# Patient Record
Sex: Female | Born: 1975
Health system: Southern US, Community
[De-identification: ages and names within clinical notes are randomized; demographics above are authoritative.]

## PROBLEM LIST (undated history)

## (undated) DIAGNOSIS — T7840XA Allergy, unspecified, initial encounter: Secondary | ICD-10-CM

## (undated) DIAGNOSIS — F17200 Nicotine dependence, unspecified, uncomplicated: Secondary | ICD-10-CM

## (undated) DIAGNOSIS — N952 Postmenopausal atrophic vaginitis: Secondary | ICD-10-CM

## (undated) DIAGNOSIS — G629 Polyneuropathy, unspecified: Secondary | ICD-10-CM

## (undated) DIAGNOSIS — J45909 Unspecified asthma, uncomplicated: Secondary | ICD-10-CM

## (undated) DIAGNOSIS — R05 Cough: Secondary | ICD-10-CM

## (undated) DIAGNOSIS — M1990 Unspecified osteoarthritis, unspecified site: Secondary | ICD-10-CM

## (undated) DIAGNOSIS — F32A Depression, unspecified: Secondary | ICD-10-CM

## (undated) DIAGNOSIS — N6310 Unspecified lump in the right breast, unspecified quadrant: Secondary | ICD-10-CM

## (undated) DIAGNOSIS — J310 Chronic rhinitis: Secondary | ICD-10-CM

## (undated) DIAGNOSIS — S92901A Unspecified fracture of right foot, initial encounter for closed fracture: Secondary | ICD-10-CM

## (undated) DIAGNOSIS — M797 Fibromyalgia: Secondary | ICD-10-CM

## (undated) DIAGNOSIS — T7422XA Child sexual abuse, confirmed, initial encounter: Secondary | ICD-10-CM

## (undated) DIAGNOSIS — F329 Major depressive disorder, single episode, unspecified: Secondary | ICD-10-CM

## (undated) DIAGNOSIS — G894 Chronic pain syndrome: Secondary | ICD-10-CM

## (undated) DIAGNOSIS — F3181 Bipolar II disorder: Secondary | ICD-10-CM

## (undated) DIAGNOSIS — K219 Gastro-esophageal reflux disease without esophagitis: Secondary | ICD-10-CM

## (undated) DIAGNOSIS — N94819 Vulvodynia, unspecified: Secondary | ICD-10-CM

## (undated) DIAGNOSIS — F40240 Claustrophobia: Secondary | ICD-10-CM

## (undated) DIAGNOSIS — R059 Cough, unspecified: Secondary | ICD-10-CM

## (undated) DIAGNOSIS — K429 Umbilical hernia without obstruction or gangrene: Secondary | ICD-10-CM

## (undated) DIAGNOSIS — N9089 Other specified noninflammatory disorders of vulva and perineum: Secondary | ICD-10-CM

## (undated) DIAGNOSIS — M5442 Lumbago with sciatica, left side: Secondary | ICD-10-CM

## (undated) DIAGNOSIS — R519 Headache, unspecified: Secondary | ICD-10-CM

## (undated) DIAGNOSIS — W19XXXA Unspecified fall, initial encounter: Secondary | ICD-10-CM

## (undated) DIAGNOSIS — F419 Anxiety disorder, unspecified: Secondary | ICD-10-CM

## (undated) DIAGNOSIS — E781 Pure hyperglyceridemia: Secondary | ICD-10-CM

## (undated) DIAGNOSIS — I10 Essential (primary) hypertension: Secondary | ICD-10-CM

## (undated) DIAGNOSIS — F4310 Post-traumatic stress disorder, unspecified: Secondary | ICD-10-CM

## (undated) DIAGNOSIS — R51 Headache: Secondary | ICD-10-CM

## (undated) HISTORY — DX: Fibromyalgia: M79.7

## (undated) HISTORY — DX: Gastro-esophageal reflux disease without esophagitis: K21.9

## (undated) HISTORY — PX: COLONOSCOPY: SHX174

## (undated) HISTORY — PX: ABDOMINAL HYSTERECTOMY: SHX81

## (undated) HISTORY — DX: Allergy, unspecified, initial encounter: T78.40XA

## (undated) HISTORY — PX: OTHER SURGICAL HISTORY: SHX169

## (undated) HISTORY — PX: HAND SURGERY: SHX662

## (undated) HISTORY — PX: WRIST SURGERY: SHX841

## (undated) HISTORY — DX: Polyneuropathy, unspecified: G62.9

## (undated) HISTORY — PX: CHOLECYSTECTOMY: SHX55

## (undated) HISTORY — PX: CYST REMOVAL TRUNK: SHX6283

## (undated) HISTORY — PX: DILATION AND CURETTAGE OF UTERUS: SHX78

## (undated) HISTORY — PX: APPENDECTOMY: SHX54

## (undated) HISTORY — PX: HERNIA REPAIR: SHX51

---

## 2012-02-26 DIAGNOSIS — N952 Postmenopausal atrophic vaginitis: Secondary | ICD-10-CM | POA: Insufficient documentation

## 2013-07-15 ENCOUNTER — Ambulatory Visit (INDEPENDENT_AMBULATORY_CARE_PROVIDER_SITE_OTHER): Payer: 59 | Admitting: Physician Assistant

## 2013-07-15 ENCOUNTER — Encounter: Payer: Self-pay | Admitting: Physician Assistant

## 2013-07-15 VITALS — BP 144/85 | HR 102 | Ht 69.5 in | Wt 288.0 lb

## 2013-07-15 DIAGNOSIS — M545 Low back pain, unspecified: Secondary | ICD-10-CM

## 2013-07-15 DIAGNOSIS — Z13220 Encounter for screening for lipoid disorders: Secondary | ICD-10-CM

## 2013-07-15 DIAGNOSIS — M5430 Sciatica, unspecified side: Secondary | ICD-10-CM

## 2013-07-15 DIAGNOSIS — Z23 Encounter for immunization: Secondary | ICD-10-CM

## 2013-07-15 DIAGNOSIS — M797 Fibromyalgia: Secondary | ICD-10-CM

## 2013-07-15 DIAGNOSIS — G43909 Migraine, unspecified, not intractable, without status migrainosus: Secondary | ICD-10-CM

## 2013-07-15 DIAGNOSIS — G629 Polyneuropathy, unspecified: Secondary | ICD-10-CM

## 2013-07-15 DIAGNOSIS — Z131 Encounter for screening for diabetes mellitus: Secondary | ICD-10-CM

## 2013-07-15 DIAGNOSIS — G589 Mononeuropathy, unspecified: Secondary | ICD-10-CM

## 2013-07-15 DIAGNOSIS — M5440 Lumbago with sciatica, unspecified side: Secondary | ICD-10-CM

## 2013-07-15 DIAGNOSIS — IMO0001 Reserved for inherently not codable concepts without codable children: Secondary | ICD-10-CM

## 2013-07-15 MED ORDER — PREGABALIN 50 MG PO CAPS: 50.0000 mg | ORAL_CAPSULE | Freq: Three times a day (TID) | ORAL | Status: DC

## 2013-07-15 MED ORDER — PREGABALIN 75 MG PO CAPS: 75.0000 mg | ORAL_CAPSULE | Freq: Three times a day (TID) | ORAL | Status: DC

## 2013-07-15 MED ORDER — AMITRIPTYLINE HCL 25 MG PO TABS: ORAL_TABLET | ORAL | Status: DC

## 2013-07-15 NOTE — Patient Instructions (Signed)
Will refer to rheumatologist.   Will start amitripyline 1-2 tabs every night.   Will start lyrica 60m three times a day.

## 2013-07-18 DIAGNOSIS — G629 Polyneuropathy, unspecified: Secondary | ICD-10-CM | POA: Insufficient documentation

## 2013-07-18 DIAGNOSIS — M545 Low back pain, unspecified: Secondary | ICD-10-CM | POA: Insufficient documentation

## 2013-07-18 DIAGNOSIS — M5440 Lumbago with sciatica, unspecified side: Secondary | ICD-10-CM

## 2013-07-18 DIAGNOSIS — M797 Fibromyalgia: Secondary | ICD-10-CM | POA: Insufficient documentation

## 2013-07-18 DIAGNOSIS — G43909 Migraine, unspecified, not intractable, without status migrainosus: Secondary | ICD-10-CM | POA: Insufficient documentation

## 2013-07-18 NOTE — Progress Notes (Signed)
Subjective:    Patient ID: Kathryn Cline, female    DOB: January 12, 1976, 38 y.o.   MRN: [id number]  HPI Pt is a 38 yo female who presents to the clinic to establish care.   .. Active Ambulatory Problems    Diagnosis Date Noted  . Fibromyalgia 07/18/2013  . Neuropathy 07/18/2013  . Low back pain with radiation 07/18/2013  . Migraines 07/18/2013   Resolved Ambulatory Problems    Diagnosis Date Noted  . No Resolved Ambulatory Problems   Past Medical History  Diagnosis Date  . GERD (gastroesophageal reflux disease)   . Allergy    .Marland Kitchen Family History  Problem Relation Age of Onset  . Heart attack Brother   . Hypertension Brother   . Heart attack Maternal Grandmother   . Hypertension Maternal Grandmother   . Cancer Maternal Grandfather   . Heart attack Maternal Grandfather   . Hypertension Maternal Grandfather   . Cancer Paternal Grandmother   . Heart attack Paternal Grandmother   . Hypertension Paternal Grandmother   . Stroke Paternal Grandmother   . Heart attack Paternal Grandfather   . Hypertension Paternal Grandfather   . Hypertension Brother    .Marland Kitchen History   Social History  . Marital Status: Married    Spouse Name: N/A    Number of Children: N/A  . Years of Education: N/A   Occupational History  . Not on file.   Social History Main Topics  . Smoking status: Current Every Day Smoker  . Smokeless tobacco: Not on file  . Alcohol Use: No  . Drug Use: No  . Sexual Activity: Yes   Other Topics Concern  . Not on file   Social History Narrative  . No narrative on file    Pt lost insurance and now they are requiring her to switch PCP's.   Migraines- [facility] but controlled on [place].   Fibromyalgia- uncontrolled with significant pain into low back, hips and legs bilaterally. Difficult to exercise or feel like she wants to move. Taking cymbalta and flexeril daily. Per pt has herniated disc as well.   Bilateral  edema- controlled with lasix daily.    Review of Systems     Objective:   Physical Exam  Constitutional: She is oriented to person, place, and time. She appears well-developed and well-nourished.  Obese.   HENT:  Head: Normocephalic and atraumatic.  Cardiovascular: Regular rhythm and normal heart sounds.   Tachycardia at 102.  Pulmonary/Chest: Effort normal and breath sounds normal. She has no wheezes.  Neurological: She is alert and oriented to person, place, and time.  Skin: Skin is dry.  Psychiatric: She has a normal mood and affect. Her behavior is normal.          Assessment & Plan:  Fibromyalgia/low back pain with radiation into hips and down legs- PHQ-9 is 17. will wait to get records to see what imaging has been done and if there is any problem we can provide treatment for. As far as fibromyaglia discussed importance of exercise and moviing to help symptoms. Pt taking cymbalta. Start elavil at bedtime to help with night time symptoms. Start lyric three times a day. Pt cannot tolerate gabapentin due to sleepiness/spaced out feeling. Takes Ibuprofen not tried celebrex etc. I did order some autoimmune work up labs and vitamins to help better tease out true fibromyaglia or if anything metabolic going on. I think pt would benefit from rheumatologist appt. Pt is certainly  depressed perhaps due to pain etc. Will make medication adjustments to today and see if helping.   Elevated blood pressure- will continue to monitor. Borderline today. Decrease salt intake.   Bilateral lower leg edema- refilled lasix.    Discussed need for mammogram this year. Pt will wait until she has turned 38. Went ahead and gave lab slip for fasting labs.   Tdap given today.

## 2013-07-20 ENCOUNTER — Telehealth: Payer: Self-pay | Admitting: *Deleted

## 2013-07-20 NOTE — Telephone Encounter (Signed)
Pt calls and states that the amitriptyline 53m you gave her is way too strong she only took 1/2 tab at bedtime and it made her "feel as if in another world" and the next day was a zombie.  Wants to know if you can try something else with her?

## 2013-07-20 NOTE — Telephone Encounter (Signed)
Ok consider taking 1/4 tab of elavil or stopping all together. Let's see how lyrica alone works if not improving will consider other medications.

## 2013-07-20 NOTE — Telephone Encounter (Signed)
Pt notified and will take 1/4 tab and let us know if still too sedating. Clemetine Marker, LPN

## 2013-07-25 ENCOUNTER — Telehealth: Payer: Self-pay | Admitting: *Deleted

## 2013-07-25 NOTE — Telephone Encounter (Signed)
I was given the elavil to help more for pain control not sleep. If need something else for sleep I can send if not Let's just leave on lyrica alone. And when you follow up discuss increasing lyrica if needed.

## 2013-07-25 NOTE — Telephone Encounter (Signed)
Pt calls and states that she took the 1/4 tab of elavil and she still can not take that.  Sleeps all night but per pt "in another world the next day". Please advise

## 2013-07-27 NOTE — Telephone Encounter (Signed)
Pt notifeid to stop the elavil and continue with the Lyrica.  If still has pain then schedule office visit to discuss. Clemetine Marker, LPN

## 2013-08-19 ENCOUNTER — Ambulatory Visit (INDEPENDENT_AMBULATORY_CARE_PROVIDER_SITE_OTHER): Payer: 59 | Admitting: Physician Assistant

## 2013-08-19 ENCOUNTER — Encounter: Payer: Self-pay | Admitting: Physician Assistant

## 2013-08-19 VITALS — BP 146/93 | HR 126 | Wt 293.0 lb

## 2013-08-19 DIAGNOSIS — M25559 Pain in unspecified hip: Secondary | ICD-10-CM

## 2013-08-19 DIAGNOSIS — M545 Low back pain, unspecified: Secondary | ICD-10-CM

## 2013-08-19 DIAGNOSIS — M25551 Pain in right hip: Secondary | ICD-10-CM

## 2013-08-19 DIAGNOSIS — R112 Nausea with vomiting, unspecified: Secondary | ICD-10-CM

## 2013-08-19 DIAGNOSIS — S92901A Unspecified fracture of right foot, initial encounter for closed fracture: Secondary | ICD-10-CM

## 2013-08-19 DIAGNOSIS — IMO0001 Reserved for inherently not codable concepts without codable children: Secondary | ICD-10-CM

## 2013-08-19 DIAGNOSIS — M797 Fibromyalgia: Secondary | ICD-10-CM

## 2013-08-19 DIAGNOSIS — I498 Other specified cardiac arrhythmias: Secondary | ICD-10-CM

## 2013-08-19 DIAGNOSIS — R000 Tachycardia, unspecified: Secondary | ICD-10-CM

## 2013-08-19 DIAGNOSIS — M25552 Pain in left hip: Secondary | ICD-10-CM

## 2013-08-19 DIAGNOSIS — S92909A Unspecified fracture of unspecified foot, initial encounter for closed fracture: Secondary | ICD-10-CM

## 2013-08-19 MED ORDER — PROMETHAZINE HCL 25 MG/ML IJ SOLN
25.0000 mg | Freq: Once | INTRAMUSCULAR | Status: AC
  Administered 2013-08-19: 25 mg via INTRAMUSCULAR

## 2013-08-19 MED ORDER — DICLOFENAC SODIUM 1 % TD GEL: 4.0000 g | Freq: Four times a day (QID) | TRANSDERMAL | Status: DC

## 2013-08-19 MED ORDER — DULOXETINE HCL 30 MG PO CPEP: ORAL_CAPSULE | ORAL | Status: DC

## 2013-08-19 MED ORDER — PROMETHAZINE HCL 25 MG PO TABS: 25.0000 mg | ORAL_TABLET | Freq: Four times a day (QID) | ORAL | Status: DC | PRN

## 2013-08-19 MED ORDER — KETOROLAC TROMETHAMINE 60 MG/2ML IM SOLN
60.0000 mg | Freq: Once | INTRAMUSCULAR | Status: AC
  Administered 2013-08-19: 60 mg via INTRAMUSCULAR

## 2013-08-19 NOTE — Patient Instructions (Addendum)
Added Voltaren.  Continue cymbalta increased to 60m.   Follow up with orthopedic.  Get xrays of lower back and hip.

## 2013-08-22 DIAGNOSIS — M545 Low back pain, unspecified: Secondary | ICD-10-CM | POA: Insufficient documentation

## 2013-08-22 DIAGNOSIS — M25551 Pain in right hip: Secondary | ICD-10-CM | POA: Insufficient documentation

## 2013-08-22 DIAGNOSIS — M25552 Pain in left hip: Secondary | ICD-10-CM

## 2013-08-22 DIAGNOSIS — S92901A Unspecified fracture of right foot, initial encounter for closed fracture: Secondary | ICD-10-CM | POA: Insufficient documentation

## 2013-08-22 NOTE — Progress Notes (Signed)
   Subjective:    Patient ID: Kathryn Cline, female    DOB: 14-Apr-1976, 38 y.o.   MRN: [id number]  HPI Pt is a 38 yo female who presents to the clinic with a fibromyalgia flare and well as pain from right broken foot. She is in a boot today and managed by othopedics clinic. She fell last weekend and broke her right foot. This has caused onset of pain today. She has been given percocet by orthopedics today. She feels like that is not controlling pain. She is very nauseated due to pain. She is vomiting. No fever, chills, abdominal pain.   Fibromyalgia- cymbalta has helped significantly with pain. She did not get labs drawn from last visit. I need her to get these. Not able to tolerate elavil, lyrica, and gabapentin.    Bilateral hip pain and radiation into legs has been off and on for year or so but worse today. Radiates down into buttocks bilateral side. Nothing tried except medications for fibromyagia no recent trauma.     Review of Systems     Objective:   Physical Exam  Constitutional: She is oriented to person, place, and time.  Obese  Right foot in a boot.  HENT:  Head: Normocephalic and atraumatic.  Cardiovascular: Regular rhythm and normal heart sounds.   Tachycardia rechecked at 110.   Pulmonary/Chest: Effort normal and breath sounds normal.  Musculoskeletal:  Pain over lumbar spine to palpation. Straight leg test negative bilaterally.   Neurological: She is alert and oriented to person, place, and time.  Skin:  Flushed cheeks bilaterally.   Psychiatric: She has a normal mood and affect. Her behavior is normal.          Assessment & Plan:  Low back pain with radiation into buttocks/bilateral hip pain- ordered xrays today. Imaging has not been done and not formally evaluated. Discussed since seeing ortho for fractured foot may want to follow up with them as well. Toradol given 75m IM today. Could be right broken foot that cause alignment to be off and caused flare of  back pain. Gave low back stretches and exercises. Consider voltaren gel.   Fibromyalgia- ordered multiple labs to further evaluate fibromyaglia. Toradol given IM today. Voltaren gel was given for specific joints. Stay away from oral NSAIDs while right foot is healing. Pt does seem to tolerate gabapentin and lyrica. Cymbalta does help signifcantly. Will increase to 964mdaily.   Nausea and vomiting- pt did vomit before being seen today. Phenergan 2540mas given IM. Phenergan 27m82mally was also sent to pharmacy.   Sinus tachycardia- decreased at recheck. Concerned about dehydration. Encouraged pt to push fluids. Follow up in 4 weeks.   Right fractured foot- being seen by orthopedics. Placed in boot for 6 weeks. Discussed if needs pain medication to get for ortho.

## 2013-08-23 ENCOUNTER — Other Ambulatory Visit: Payer: Self-pay | Admitting: Physician Assistant

## 2013-08-23 ENCOUNTER — Ambulatory Visit (INDEPENDENT_AMBULATORY_CARE_PROVIDER_SITE_OTHER): Payer: 59

## 2013-08-23 DIAGNOSIS — M25551 Pain in right hip: Secondary | ICD-10-CM

## 2013-08-23 DIAGNOSIS — M25559 Pain in unspecified hip: Secondary | ICD-10-CM

## 2013-08-23 DIAGNOSIS — G8929 Other chronic pain: Secondary | ICD-10-CM

## 2013-08-23 DIAGNOSIS — M545 Low back pain, unspecified: Secondary | ICD-10-CM

## 2013-08-23 DIAGNOSIS — M25552 Pain in left hip: Secondary | ICD-10-CM

## 2013-08-23 DIAGNOSIS — M797 Fibromyalgia: Secondary | ICD-10-CM

## 2013-08-23 NOTE — Progress Notes (Unsigned)
Insurance not wanting to pay for 3 tabs of the 62m. Find out if patient finds the 959mdose benefical if she we might be able to prescribe the 608mlus the 33m31mce a day.

## 2013-08-24 ENCOUNTER — Encounter: Payer: Self-pay | Admitting: Physician Assistant

## 2013-08-24 DIAGNOSIS — E781 Pure hyperglyceridemia: Secondary | ICD-10-CM | POA: Insufficient documentation

## 2013-08-24 LAB — COMPLETE METABOLIC PANEL WITH GFR
ALK PHOS: 67 U/L (ref 39–117)
ALT: 38 U/L — AB (ref 0–35)
AST: 30 U/L (ref 0–37)
Albumin: 4.7 g/dL (ref 3.5–5.2)
BUN: 9 mg/dL (ref 6–23)
CHLORIDE: 100 meq/L (ref 96–112)
CO2: 25 meq/L (ref 19–32)
Calcium: 9.8 mg/dL (ref 8.4–10.5)
Creat: 0.6 mg/dL (ref 0.50–1.10)
GFR, Est African American: >89 mL/min
GFR, Est Non African American: >89 mL/min
GLUCOSE: 79 mg/dL (ref 70–99)
POTASSIUM: 4.7 meq/L (ref 3.5–5.3)
SODIUM: 139 meq/L (ref 135–145)
Total Bilirubin: 0.5 mg/dL (ref 0.2–1.2)
Total Protein: 7.5 g/dL (ref 6.0–8.3)

## 2013-08-24 LAB — LIPID PANEL
Cholesterol: 148 mg/dL (ref 0–200)
HDL: 29 mg/dL — ABNORMAL LOW (ref >39)
LDL Cholesterol: 41 mg/dL (ref 0–99)
Total CHOL/HDL Ratio: 5.1 Ratio
Triglycerides: 390 mg/dL — ABNORMAL HIGH (ref <150)
VLDL: 78 mg/dL — ABNORMAL HIGH (ref 0–40)

## 2013-08-24 LAB — SEDIMENTATION RATE: SED RATE: 7 mm/h (ref 0–22)

## 2013-08-24 LAB — VITAMIN D 25 HYDROXY (VIT D DEFICIENCY, FRACTURES): Vit D, 25-Hydroxy: 32 ng/mL (ref 30–89)

## 2013-08-24 LAB — ANA: ANA: NEGATIVE

## 2013-08-24 LAB — RHEUMATOID FACTOR

## 2013-08-24 LAB — CYCLIC CITRUL PEPTIDE ANTIBODY, IGG

## 2013-08-24 LAB — T4, FREE: Free T4: 1.15 ng/dL (ref 0.80–1.80)

## 2013-08-24 LAB — VITAMIN B12: VITAMIN B 12: 400 pg/mL (ref 211–911)

## 2013-08-24 LAB — TSH: TSH: 2.582 u[IU]/mL (ref 0.350–4.500)

## 2013-08-25 ENCOUNTER — Encounter: Payer: Self-pay | Admitting: Sports Medicine

## 2013-08-25 ENCOUNTER — Ambulatory Visit (INDEPENDENT_AMBULATORY_CARE_PROVIDER_SITE_OTHER): Payer: 59 | Admitting: Sports Medicine

## 2013-08-25 VITALS — BP 149/102 | HR 98 | Ht 69.0 in | Wt 294.0 lb

## 2013-08-25 DIAGNOSIS — M797 Fibromyalgia: Secondary | ICD-10-CM

## 2013-08-25 DIAGNOSIS — IMO0001 Reserved for inherently not codable concepts without codable children: Secondary | ICD-10-CM

## 2013-08-25 DIAGNOSIS — M160 Bilateral primary osteoarthritis of hip: Secondary | ICD-10-CM | POA: Insufficient documentation

## 2013-08-25 DIAGNOSIS — S92909A Unspecified fracture of unspecified foot, initial encounter for closed fracture: Secondary | ICD-10-CM

## 2013-08-25 DIAGNOSIS — M1610 Unilateral primary osteoarthritis, unspecified hip: Secondary | ICD-10-CM

## 2013-08-25 DIAGNOSIS — S92901A Unspecified fracture of right foot, initial encounter for closed fracture: Secondary | ICD-10-CM

## 2013-08-25 DIAGNOSIS — M169 Osteoarthritis of hip, unspecified: Secondary | ICD-10-CM

## 2013-08-25 MED ORDER — HYDROCODONE-ACETAMINOPHEN 10-325 MG PO TABS: 1.0000 | ORAL_TABLET | Freq: Three times a day (TID) | ORAL | Status: DC | PRN

## 2013-08-25 NOTE — Assessment & Plan Note (Signed)
Considering her x-rays and physical exam I do think her pain is coming from hip osteoarthritis. Unfortunately due to her concurrent healing tibial fracture I am unable to inject steroids. At this point I am going to place her through formal physical therapy and give her some hydrocodone 10/325 for use up to twice a day. I like to see her back in one month, at which point we can inject up of her hips. This will take her tibial fracture out to 6 weeks.

## 2013-08-25 NOTE — Assessment & Plan Note (Signed)
At this point we will avoid steroids injections due to her current healing tibial fracture. This should be healed in about 4 weeks.

## 2013-08-25 NOTE — Progress Notes (Unsigned)
Spoke with pt this morning.  She states that she hasn't started on the 13m yet because she couldn't get it filled.  She is currently just on the 680m

## 2013-08-25 NOTE — Progress Notes (Signed)
Patient ID: Kathryn Cline, female   DOB: [birth year], 38 y.o.   MRN: [id number]   Subjective:    I'm seeing this patient as a consultation for:  Kathryn Planas, PA-C  CC: bilateral hip pain  HPI: Patient is a 38 yo woman w/ pmh of fibromyalgia and peripheral neuropathy who presents with bilateral hip and lower back pain x several years.  Her pain began gradually and is dull and achy in nature.  It is most prominent in her lower back bilaterally but also radiates to the groin at times.  It is worst at night and after long periods of movement.  Rest improves the pain somewhat.  She has tried advil and flexeril, neither of which eradicate her pain.  She also endorses some pain radiating into her thighs.  Recent X-ray showed OA of bilateral hips. She has a recent ankle fracture of the right tibia (2 weeks ago).   Past medical history, Surgical history, Family history not pertinant except as noted below, Social history, Allergies, and medications have been entered into the medical record, reviewed, and no changes needed.   Review of Systems: No headache, visual changes, nausea, vomiting, diarrhea, constipation, dizziness, abdominal pain, skin rash, fevers, chills, night sweats, weight loss, swollen lymph nodes, body aches, joint swelling, muscle aches, chest pain, shortness of breath, mood changes, visual or auditory hallucinations.   Objective:   General: Well Developed, well nourished, and in no acute distress.  Neuro/Psych: Alert and oriented x3, extra-ocular muscles intact, able to move all 4 extremities, sensation grossly intact. Skin: Warm and dry, no rashes noted.  Respiratory: Not using accessory muscles, speaking in full sentences, trachea midline.  Cardiovascular: Pulses palpable, no extremity edema. Abdomen: Does not appear distended. Left Hip: ROM IR: 45 Deg, ER: 45 Deg, Flexion: 120 Deg, Extension: 100 Deg, Abduction: 45 Deg, Adduction: 45 Deg Strength IR: 5/5, ER: 5/5, Flexion: 5/5,  Extension: 5/5, Abduction: 5/5, Adduction: 5/5 Pelvic alignment unremarkable to inspection and palpation. Standing hip rotation and gait without trendelenburg sign / unsteadiness. Greater trochanter without tenderness to palpation. No tenderness over piriformis. No pain with FABER or FADIR. No SI joint tenderness and normal minimal SI movement. Pain reproduced with internal rotation Right Hip: ROM IR: 45 Deg, ER: 45 Deg, Flexion: 120 Deg, Extension: 100 Deg, Abduction: 45 Deg, Adduction: 45 Deg Strength IR: 5/5, ER: 5/5, Flexion: 5/5, Extension: 5/5, Abduction: 5/5, Adduction: 5/5 Pelvic alignment unremarkable to inspection and palpation. Standing hip rotation and gait without trendelenburg sign / unsteadiness. Greater trochanter without tenderness to palpation. No tenderness over piriformis. No pain with FABER or FADIR. No SI joint tenderness and normal minimal SI movement. Pain reproduced with internal rotation  X-rays were reviewed and do show mild to moderate osteoarthritis of both hip joints.  Impression and Recommendations:   This case required medical decision making of moderate complexity.  Pain and physical exam consistent with bilateral OA of hips.  Unlikely discogenic pain as PE is unremarkable and patient has confounding history of neuropathy and fibromyalgia.  Has failed conservative therapy so injections are a reasonable treatment at this time. Will have to postpone 2/2 fracture healing.   -f/u in 4 weeks for hip injections -Norco

## 2013-08-25 NOTE — Assessment & Plan Note (Signed)
Doubling cymbalta.

## 2013-08-26 ENCOUNTER — Other Ambulatory Visit: Payer: Self-pay | Admitting: Physician Assistant

## 2013-08-26 MED ORDER — ICOSAPENT ETHYL 1 G PO CAPS: ORAL_CAPSULE | ORAL | Status: DC

## 2013-08-26 NOTE — Progress Notes (Signed)
Try staying on 60 with addition of Dr. Darene Lamer suggestion and will go from there due to response.

## 2013-08-31 ENCOUNTER — Ambulatory Visit (INDEPENDENT_AMBULATORY_CARE_PROVIDER_SITE_OTHER): Payer: 59 | Admitting: Physical Therapy

## 2013-08-31 DIAGNOSIS — M1610 Unilateral primary osteoarthritis, unspecified hip: Secondary | ICD-10-CM

## 2013-08-31 DIAGNOSIS — M6281 Muscle weakness (generalized): Secondary | ICD-10-CM

## 2013-08-31 DIAGNOSIS — R262 Difficulty in walking, not elsewhere classified: Secondary | ICD-10-CM

## 2013-08-31 DIAGNOSIS — M169 Osteoarthritis of hip, unspecified: Secondary | ICD-10-CM

## 2013-08-31 DIAGNOSIS — M2550 Pain in unspecified joint: Secondary | ICD-10-CM

## 2013-09-05 ENCOUNTER — Encounter (INDEPENDENT_AMBULATORY_CARE_PROVIDER_SITE_OTHER): Payer: 59 | Admitting: Physical Therapy

## 2013-09-05 ENCOUNTER — Encounter (INDEPENDENT_AMBULATORY_CARE_PROVIDER_SITE_OTHER): Payer: Self-pay

## 2013-09-05 DIAGNOSIS — M169 Osteoarthritis of hip, unspecified: Secondary | ICD-10-CM

## 2013-09-05 DIAGNOSIS — M6281 Muscle weakness (generalized): Secondary | ICD-10-CM

## 2013-09-05 DIAGNOSIS — M1610 Unilateral primary osteoarthritis, unspecified hip: Secondary | ICD-10-CM

## 2013-09-05 DIAGNOSIS — M2550 Pain in unspecified joint: Secondary | ICD-10-CM

## 2013-09-05 DIAGNOSIS — R262 Difficulty in walking, not elsewhere classified: Secondary | ICD-10-CM

## 2013-09-07 ENCOUNTER — Telehealth: Payer: Self-pay | Admitting: *Deleted

## 2013-09-07 NOTE — Telephone Encounter (Signed)
Pt left vm asking if she could have a referral to [facility].

## 2013-09-08 ENCOUNTER — Encounter (INDEPENDENT_AMBULATORY_CARE_PROVIDER_SITE_OTHER): Payer: 59

## 2013-09-08 ENCOUNTER — Other Ambulatory Visit: Payer: 59 | Admitting: Physician Assistant

## 2013-09-08 DIAGNOSIS — M160 Bilateral primary osteoarthritis of hip: Secondary | ICD-10-CM

## 2013-09-08 DIAGNOSIS — R262 Difficulty in walking, not elsewhere classified: Secondary | ICD-10-CM

## 2013-09-08 DIAGNOSIS — M2550 Pain in unspecified joint: Secondary | ICD-10-CM

## 2013-09-08 DIAGNOSIS — M1610 Unilateral primary osteoarthritis, unspecified hip: Secondary | ICD-10-CM

## 2013-09-08 DIAGNOSIS — M6281 Muscle weakness (generalized): Secondary | ICD-10-CM

## 2013-09-08 DIAGNOSIS — M169 Osteoarthritis of hip, unspecified: Secondary | ICD-10-CM

## 2013-09-08 NOTE — Telephone Encounter (Signed)
Yes placed.

## 2013-09-09 NOTE — Telephone Encounter (Signed)
Pt.notified

## 2013-09-12 ENCOUNTER — Encounter (INDEPENDENT_AMBULATORY_CARE_PROVIDER_SITE_OTHER): Payer: 59 | Admitting: Physical Therapy

## 2013-09-12 DIAGNOSIS — M6281 Muscle weakness (generalized): Secondary | ICD-10-CM

## 2013-09-12 DIAGNOSIS — R262 Difficulty in walking, not elsewhere classified: Secondary | ICD-10-CM

## 2013-09-12 DIAGNOSIS — M169 Osteoarthritis of hip, unspecified: Secondary | ICD-10-CM

## 2013-09-12 DIAGNOSIS — M2550 Pain in unspecified joint: Secondary | ICD-10-CM

## 2013-09-12 DIAGNOSIS — M1610 Unilateral primary osteoarthritis, unspecified hip: Secondary | ICD-10-CM

## 2013-09-14 ENCOUNTER — Encounter (INDEPENDENT_AMBULATORY_CARE_PROVIDER_SITE_OTHER): Payer: 59

## 2013-09-14 DIAGNOSIS — M6281 Muscle weakness (generalized): Secondary | ICD-10-CM

## 2013-09-14 DIAGNOSIS — M1610 Unilateral primary osteoarthritis, unspecified hip: Secondary | ICD-10-CM

## 2013-09-14 DIAGNOSIS — M2550 Pain in unspecified joint: Secondary | ICD-10-CM

## 2013-09-14 DIAGNOSIS — R262 Difficulty in walking, not elsewhere classified: Secondary | ICD-10-CM

## 2013-09-14 DIAGNOSIS — M169 Osteoarthritis of hip, unspecified: Secondary | ICD-10-CM

## 2013-09-16 ENCOUNTER — Encounter: Payer: Self-pay | Admitting: Physician Assistant

## 2013-09-16 ENCOUNTER — Ambulatory Visit (INDEPENDENT_AMBULATORY_CARE_PROVIDER_SITE_OTHER): Payer: 59 | Admitting: Physician Assistant

## 2013-09-16 VITALS — BP 137/88 | HR 99 | Ht 69.0 in | Wt 295.0 lb

## 2013-09-16 DIAGNOSIS — J069 Acute upper respiratory infection, unspecified: Secondary | ICD-10-CM

## 2013-09-16 DIAGNOSIS — F329 Major depressive disorder, single episode, unspecified: Secondary | ICD-10-CM

## 2013-09-16 DIAGNOSIS — F32A Depression, unspecified: Secondary | ICD-10-CM

## 2013-09-16 DIAGNOSIS — F3289 Other specified depressive episodes: Secondary | ICD-10-CM

## 2013-09-16 DIAGNOSIS — R748 Abnormal levels of other serum enzymes: Secondary | ICD-10-CM

## 2013-09-16 DIAGNOSIS — B9689 Other specified bacterial agents as the cause of diseases classified elsewhere: Secondary | ICD-10-CM

## 2013-09-16 DIAGNOSIS — M797 Fibromyalgia: Secondary | ICD-10-CM

## 2013-09-16 DIAGNOSIS — J329 Chronic sinusitis, unspecified: Secondary | ICD-10-CM

## 2013-09-16 DIAGNOSIS — IMO0001 Reserved for inherently not codable concepts without codable children: Secondary | ICD-10-CM

## 2013-09-16 DIAGNOSIS — A499 Bacterial infection, unspecified: Secondary | ICD-10-CM

## 2013-09-16 MED ORDER — AMOXICILLIN 500 MG PO CAPS: 500.0000 mg | ORAL_CAPSULE | Freq: Three times a day (TID) | ORAL | Status: DC

## 2013-09-16 MED ORDER — DULOXETINE HCL 60 MG PO CPEP: ORAL_CAPSULE | ORAL | Status: DC

## 2013-09-16 MED ORDER — FUROSEMIDE 40 MG PO TABS: 40.0000 mg | ORAL_TABLET | Freq: Every day | ORAL | Status: DC

## 2013-09-16 MED ORDER — ESOMEPRAZOLE MAGNESIUM 40 MG PO CPDR: 40.0000 mg | DELAYED_RELEASE_CAPSULE | Freq: Every day | ORAL | Status: DC

## 2013-09-17 LAB — HEPATIC FUNCTION PANEL
ALT: 40 U/L — ABNORMAL HIGH (ref 0–35)
AST: 29 U/L (ref 0–37)
Albumin: 4.4 g/dL (ref 3.5–5.2)
Alkaline Phosphatase: 59 U/L (ref 39–117)
BILIRUBIN DIRECT: 0.1 mg/dL (ref 0.0–0.3)
BILIRUBIN TOTAL: 0.5 mg/dL (ref 0.2–1.2)
Indirect Bilirubin: 0.4 mg/dL (ref 0.2–1.2)
Total Protein: 7.1 g/dL (ref 6.0–8.3)

## 2013-09-19 ENCOUNTER — Encounter (INDEPENDENT_AMBULATORY_CARE_PROVIDER_SITE_OTHER): Payer: 59 | Admitting: Physical Therapy

## 2013-09-19 DIAGNOSIS — M1610 Unilateral primary osteoarthritis, unspecified hip: Secondary | ICD-10-CM

## 2013-09-19 DIAGNOSIS — M6281 Muscle weakness (generalized): Secondary | ICD-10-CM

## 2013-09-19 DIAGNOSIS — M2550 Pain in unspecified joint: Secondary | ICD-10-CM

## 2013-09-19 DIAGNOSIS — R262 Difficulty in walking, not elsewhere classified: Secondary | ICD-10-CM

## 2013-09-19 DIAGNOSIS — M169 Osteoarthritis of hip, unspecified: Secondary | ICD-10-CM

## 2013-09-19 NOTE — Progress Notes (Addendum)
   Subjective:    Patient ID: Kathryn Cline, female    DOB: December 19, 1975, 38 y.o.   MRN: [id number]  HPI Patient presents to the clinic to followup on fibromyalgia as well as discussed not feeling well.  Fibromyalgia-Dr. T. recently increase her Cymbalta to 1 when he noted gram daily for pain control. She is doing much better on this regimen. She would like to continue it. She still has a lot of difficulty staying active. With the osteoarthritis in both hips it is hard to be active. He is also working with this.Mood has significantly improved.   ..SINUSITIS  Onset: 7 days  Severity: moderate Worse with:time and position changes Better with: nothing  Symptoms Cough: no Runny nose: no Fever: no   Highest Temp: none Sinus Pressure: yes  Ears Blocked: yes  Teeth Ache: yes  Frontal Headache: yes  Second sickening: no   PMH Sinusitis or Recurrent OM: no  PMH Prior Sinus or Ear Surgery: no  Recent antibiotic usage (last 30 days): no  PMH of Diabetes or Immunocompromise: no    Red flags Change in mental state: no Change in vision: no Rash: no    Review of Systems  All other systems reviewed and are negative.      Objective:   Physical Exam  Constitutional: She is oriented to person, place, and time. She appears well-developed and well-nourished.  HENT:  Head: Normocephalic and atraumatic.  Right Ear: External ear normal.  Left Ear: External ear normal.  Nose: Nose normal.  Mouth/Throat: Oropharynx is clear and moist.  Tm's clear.  Maxillary tenderness to palpation bilaterally.   Eyes: Conjunctivae are normal. Right eye exhibits no discharge. Left eye exhibits no discharge.  Neck: Normal range of motion. Neck supple.  Cardiovascular: Normal rate, regular rhythm and normal heart sounds.   Pulmonary/Chest: Effort normal and breath sounds normal. She has no wheezes.  Lymphadenopathy:    She has no cervical adenopathy.  Neurological: She is alert and oriented to person,  place, and time.  Psychiatric: She has a normal mood and affect. Her behavior is normal.          Assessment & Plan:  Fibromyalgia/depression-send prescription for Cymbalta 120 mg daily to pharmacy. Patient is seen Dr. Darene Lamer. for osteoarthritis of both hips along with pain management syndrome. She requests Vicodin today. We'll defer to Dr. Darene Lamer. for narcotic since he is treating her pain. Pt is is repsonding to cymbalta and depression is improving.   Bacterial sinusitis/URI-treated with Amoxil today for 10 days. Discuss symptomatic care. Handout given. Call if not improving.  Elevated liver enzymes-patient is on chronic narcotics along with Tylenol. She admits to taking copious amounts of Tylenol. She has stopped taking the Tylenol with hydrocodone. We'll recheck today.

## 2013-09-21 ENCOUNTER — Encounter: Payer: Self-pay | Admitting: Physician Assistant

## 2013-09-21 ENCOUNTER — Telehealth: Payer: Self-pay | Admitting: *Deleted

## 2013-09-21 DIAGNOSIS — F329 Major depressive disorder, single episode, unspecified: Secondary | ICD-10-CM | POA: Insufficient documentation

## 2013-09-21 DIAGNOSIS — F32A Depression, unspecified: Secondary | ICD-10-CM | POA: Insufficient documentation

## 2013-09-21 NOTE — Telephone Encounter (Signed)
Pt is on her last day of Cymbalta. What would you like for her to do since insurance denied Cymbalta?

## 2013-09-21 NOTE — Telephone Encounter (Signed)
Call in 51m daily until we can get [id number].

## 2013-09-22 ENCOUNTER — Encounter (INDEPENDENT_AMBULATORY_CARE_PROVIDER_SITE_OTHER): Payer: 59 | Admitting: Physical Therapy

## 2013-09-22 ENCOUNTER — Encounter: Payer: Self-pay | Admitting: Sports Medicine

## 2013-09-22 ENCOUNTER — Ambulatory Visit (INDEPENDENT_AMBULATORY_CARE_PROVIDER_SITE_OTHER): Payer: 59 | Admitting: Sports Medicine

## 2013-09-22 VITALS — BP 142/92 | HR 88 | Ht 69.0 in | Wt 292.0 lb

## 2013-09-22 DIAGNOSIS — M1610 Unilateral primary osteoarthritis, unspecified hip: Secondary | ICD-10-CM

## 2013-09-22 DIAGNOSIS — M6281 Muscle weakness (generalized): Secondary | ICD-10-CM

## 2013-09-22 DIAGNOSIS — M2550 Pain in unspecified joint: Secondary | ICD-10-CM

## 2013-09-22 DIAGNOSIS — M160 Bilateral primary osteoarthritis of hip: Secondary | ICD-10-CM

## 2013-09-22 DIAGNOSIS — M169 Osteoarthritis of hip, unspecified: Secondary | ICD-10-CM

## 2013-09-22 DIAGNOSIS — R262 Difficulty in walking, not elsewhere classified: Secondary | ICD-10-CM

## 2013-09-22 MED ORDER — DULOXETINE HCL 60 MG PO CPEP: 60.0000 mg | ORAL_CAPSULE | Freq: Every day | ORAL | Status: DC

## 2013-09-22 NOTE — Assessment & Plan Note (Signed)
Foot fractures now healed. Bilateral femoral acetabular joint injection as above. Return in a month.

## 2013-09-22 NOTE — Progress Notes (Signed)
  Subjective:    CC: Followup  HPI: Bilateral hip osteoarthritis: Continues to have pain, we have deferred injection due to healing for fracture. She returns today for consideration of injection with bilateral groin pain radiating into the buttock. Worse with weightbearing. Moderate, persistent.  Past medical history, Surgical history, Family history not pertinant except as noted below, Social history, Allergies, and medications have been entered into the medical record, reviewed, and no changes needed.   Review of Systems: No fevers, chills, night sweats, weight loss, chest pain, or shortness of breath.   Objective:    General: Well Developed, well nourished, and in no acute distress.  Neuro: Alert and oriented x3, extra-ocular muscles intact, sensation grossly intact.  HEENT: Normocephalic, atraumatic, pupils equal round reactive to light, neck supple, no masses, no lymphadenopathy, thyroid nonpalpable.  Skin: Warm and dry, no rashes. Cardiac: Regular rate and rhythm, no murmurs rubs or gallops, no lower extremity edema.  Respiratory: Clear to auscultation bilaterally. Not using accessory muscles, speaking in full sentences.  Procedure: Real-time Ultrasound Guided Injection of left femoral acetabular joint Device: GE Logiq E  Verbal informed consent obtained.  Time-out conducted.  Noted no overlying erythema, induration, or other signs of local infection.  Skin prepped in a sterile fashion.  Local anesthesia: Topical Ethyl chloride.  With sterile technique and under real time ultrasound guidance:  Spinal needle advanced to the femoral head/neck junction, 2 cc Kenalog 40, 4 cc lidocaine injected easily. Completed without difficulty  Pain immediately resolved suggesting accurate placement of the medication.  Advised to call if fevers/chills, erythema, induration, drainage, or persistent bleeding.  Images permanently stored and available for review in the ultrasound unit.  Impression:  Technically successful ultrasound guided injection.  Procedure: Real-time Ultrasound Guided Injection of right femoral acetabular joint Device: GE Logiq E  Verbal informed consent obtained.  Time-out conducted.  Noted no overlying erythema, induration, or other signs of local infection.  Skin prepped in a sterile fashion.  Local anesthesia: Topical Ethyl chloride.  With sterile technique and under real time ultrasound guidance:  Spinal needle advanced to the femoral head/neck junction, 2 cc Kenalog 40, 4 cc lidocaine injected easily. Completed without difficulty  Pain immediately resolved suggesting accurate placement of the medication.  Advised to call if fevers/chills, erythema, induration, drainage, or persistent bleeding.  Images permanently stored and available for review in the ultrasound unit.  Impression: Technically successful ultrasound guided injection.  Impression and Recommendations:

## 2013-09-22 NOTE — Telephone Encounter (Signed)
LM on VM with instructions.  Oscar La, LPN

## 2013-09-23 ENCOUNTER — Other Ambulatory Visit: Payer: Self-pay | Admitting: *Deleted

## 2013-09-23 MED ORDER — DULOXETINE HCL 60 MG PO CPEP: 60.0000 mg | ORAL_CAPSULE | Freq: Every day | ORAL | Status: DC

## 2013-09-29 ENCOUNTER — Encounter (INDEPENDENT_AMBULATORY_CARE_PROVIDER_SITE_OTHER): Payer: 59 | Admitting: Physical Therapy

## 2013-09-29 DIAGNOSIS — R262 Difficulty in walking, not elsewhere classified: Secondary | ICD-10-CM

## 2013-09-29 DIAGNOSIS — M6281 Muscle weakness (generalized): Secondary | ICD-10-CM

## 2013-09-29 DIAGNOSIS — M1610 Unilateral primary osteoarthritis, unspecified hip: Secondary | ICD-10-CM

## 2013-09-29 DIAGNOSIS — M169 Osteoarthritis of hip, unspecified: Secondary | ICD-10-CM

## 2013-09-29 DIAGNOSIS — M2550 Pain in unspecified joint: Secondary | ICD-10-CM

## 2013-09-30 ENCOUNTER — Encounter (INDEPENDENT_AMBULATORY_CARE_PROVIDER_SITE_OTHER): Payer: 59 | Admitting: Physical Therapy

## 2013-09-30 DIAGNOSIS — M6281 Muscle weakness (generalized): Secondary | ICD-10-CM

## 2013-09-30 DIAGNOSIS — M1610 Unilateral primary osteoarthritis, unspecified hip: Secondary | ICD-10-CM

## 2013-09-30 DIAGNOSIS — R262 Difficulty in walking, not elsewhere classified: Secondary | ICD-10-CM

## 2013-09-30 DIAGNOSIS — M2550 Pain in unspecified joint: Secondary | ICD-10-CM

## 2013-09-30 DIAGNOSIS — M169 Osteoarthritis of hip, unspecified: Secondary | ICD-10-CM

## 2013-10-03 ENCOUNTER — Encounter (INDEPENDENT_AMBULATORY_CARE_PROVIDER_SITE_OTHER): Payer: 59 | Admitting: Physical Therapy

## 2013-10-03 DIAGNOSIS — R262 Difficulty in walking, not elsewhere classified: Secondary | ICD-10-CM

## 2013-10-03 DIAGNOSIS — M2550 Pain in unspecified joint: Secondary | ICD-10-CM

## 2013-10-03 DIAGNOSIS — M169 Osteoarthritis of hip, unspecified: Secondary | ICD-10-CM

## 2013-10-03 DIAGNOSIS — M1610 Unilateral primary osteoarthritis, unspecified hip: Secondary | ICD-10-CM

## 2013-10-03 DIAGNOSIS — M6281 Muscle weakness (generalized): Secondary | ICD-10-CM

## 2013-10-06 ENCOUNTER — Encounter (INDEPENDENT_AMBULATORY_CARE_PROVIDER_SITE_OTHER): Payer: 59

## 2013-10-06 DIAGNOSIS — R262 Difficulty in walking, not elsewhere classified: Secondary | ICD-10-CM

## 2013-10-06 DIAGNOSIS — M2550 Pain in unspecified joint: Secondary | ICD-10-CM

## 2013-10-06 DIAGNOSIS — M6281 Muscle weakness (generalized): Secondary | ICD-10-CM

## 2013-10-06 DIAGNOSIS — M1610 Unilateral primary osteoarthritis, unspecified hip: Secondary | ICD-10-CM

## 2013-10-06 DIAGNOSIS — M169 Osteoarthritis of hip, unspecified: Secondary | ICD-10-CM

## 2013-10-10 ENCOUNTER — Encounter (INDEPENDENT_AMBULATORY_CARE_PROVIDER_SITE_OTHER): Payer: 59 | Admitting: Physical Therapy

## 2013-10-10 ENCOUNTER — Encounter: Payer: Self-pay | Admitting: Physician Assistant

## 2013-10-10 ENCOUNTER — Ambulatory Visit (INDEPENDENT_AMBULATORY_CARE_PROVIDER_SITE_OTHER): Payer: 59 | Admitting: Physician Assistant

## 2013-10-10 VITALS — BP 138/93 | HR 97 | Temp 98.1°F | Wt 286.0 lb

## 2013-10-10 DIAGNOSIS — M6281 Muscle weakness (generalized): Secondary | ICD-10-CM

## 2013-10-10 DIAGNOSIS — T63301A Toxic effect of unspecified spider venom, accidental (unintentional), initial encounter: Secondary | ICD-10-CM

## 2013-10-10 DIAGNOSIS — R262 Difficulty in walking, not elsewhere classified: Secondary | ICD-10-CM

## 2013-10-10 DIAGNOSIS — M1610 Unilateral primary osteoarthritis, unspecified hip: Secondary | ICD-10-CM

## 2013-10-10 DIAGNOSIS — R635 Abnormal weight gain: Secondary | ICD-10-CM

## 2013-10-10 DIAGNOSIS — M2550 Pain in unspecified joint: Secondary | ICD-10-CM

## 2013-10-10 DIAGNOSIS — M169 Osteoarthritis of hip, unspecified: Secondary | ICD-10-CM

## 2013-10-10 DIAGNOSIS — T6391XA Toxic effect of contact with unspecified venomous animal, accidental (unintentional), initial encounter: Secondary | ICD-10-CM

## 2013-10-10 MED ORDER — PREDNISONE 20 MG PO TABS: ORAL_TABLET | ORAL | Status: DC

## 2013-10-10 MED ORDER — PHENTERMINE HCL 37.5 MG PO TABS: 37.5000 mg | ORAL_TABLET | Freq: Every day | ORAL | Status: DC

## 2013-10-10 NOTE — Patient Instructions (Signed)
Spider Bite Spider bites are not common. Most spider bites do not cause serious problems. The elderly, very young children, and people with certain existing medical conditions are more likely to experience significant symptoms. SYMPTOMS  Spider bites may not cause any pain at first. Within 1 or 2 days of the bite, there may be swelling, redness, and pain in the bite area. However, some spider bites can cause pain within the first hour. TREATMENT  Your caregiver may prescribe antibiotic medicine if a bacterial infection develops in the bite. However, not all spider bites require antibiotics or prescription medicines.  HOME CARE INSTRUCTIONS  Do not scratch the bite area.  Keep the bite area clean and dry. Wash the area with soap and water as directed.  Put ice or cool compresses on the bite area.  Put ice in a plastic bag.  Place a towel between your skin and the bag.  Leave the ice on for 20 minutes, 4 times a day for the first 2 to 3 days, or as directed.  Keep the bite area elevated above the level of your heart. This helps reduce redness and swelling.  Only take over-the-counter or prescription medicines as directed by your caregiver.  If you are given antibiotics, take them as directed. Finish them even if you start to feel better. You may need a tetanus shot if:  You cannot remember when you had your last tetanus shot.  You have never had a tetanus shot.  The injury broke your skin. If you get a tetanus shot, your arm may swell, get red, and feel warm to the touch. This is common and not a problem. If you need a tetanus shot and you choose not to have one, there is a rare chance of getting tetanus. Sickness from tetanus can be serious. SEEK MEDICAL CARE IF: Your bite is not better after 3 days of treatment. SEEK IMMEDIATE MEDICAL CARE IF:  Your bite turns purple or develops increased swelling, pain, or redness.  You develop shortness of breath or chest pain.  You have  muscle cramps or painful muscle spasms.  You develop abdominal pain, nausea, or vomiting.  You feel unusually tired or sleepy. MAKE SURE YOU:  Understand these instructions.  Will watch your condition.  Will get help right away if you are not doing well or get worse. Document Released: 05/29/2004 Document Revised: 07/14/2011 Document Reviewed: 11/20/2010 [facility] Patient Information 2014 [place].

## 2013-10-10 NOTE — Progress Notes (Signed)
   Subjective:    Patient ID: Kathryn Cline, female    DOB: 1975-07-06, 38 y.o.   MRN: [id number]  HPI Pt got a bite on her left middle back 2 days ago. She felt something bite and when she swatted away saw a spider. Put peroxide on wound. Done nothing else. Continues to itch and be swollen and red. She has felt a little dizzy and swimmy head last few days. Her stomach has been a little uneasy. No fever, chills, diarrhea. No HA.   Would like to lose weight. Lost 9lbs on her own. Feels like she needs help. Limited her calories but struggles to exercise. Has ongoing osteoarthritis but is improving.    Review of Systems  All other systems reviewed and are negative.      Objective:   Physical Exam  Constitutional: She is oriented to person, place, and time. She appears well-developed and well-nourished.  Pulmonary/Chest:    Neurological: She is alert and oriented to person, place, and time.  Psychiatric: She has a normal mood and affect. Her behavior is normal.          Assessment & Plan:  Spider bite- ice 43mn at a time. Ibuprofen for inflammation and swelling. Placed bactroban and gave some samples instructed to keep wound clean and dry. Gave prednisone for 3 days. Gave HO on spider bite. Discussed warning signs of infection. If not improving or if symptoms worsening could consider abx for infection. I do not feel like pt has infection today. Tdap UPT.   Obesity/abnormal weight gain- will start phentermine. Discussed side effects. Follow up in one month. Best thing she can do for osteoarthritis is to lose weight. Suggested 1200 calorie diet and walking 340mutes at least three times a week to start.   Spent 30 minutes with pt and greater than 50 percent of visit spent counseling pt regarding weight loss.

## 2013-10-11 ENCOUNTER — Telehealth: Payer: Self-pay

## 2013-10-11 NOTE — Telephone Encounter (Signed)
Kathryn Cline complains of vomiting. Denies fever, chills or sweats. I advised her to stop the prednisone per [name], PA-C and to call if symptoms persist or worsen.

## 2013-10-13 ENCOUNTER — Encounter: Payer: 59 | Admitting: Physical Therapy

## 2013-10-18 ENCOUNTER — Encounter: Payer: 59 | Admitting: Physical Therapy

## 2013-10-20 ENCOUNTER — Encounter: Payer: Self-pay | Admitting: Sports Medicine

## 2013-10-20 ENCOUNTER — Ambulatory Visit (INDEPENDENT_AMBULATORY_CARE_PROVIDER_SITE_OTHER): Payer: 59 | Admitting: Sports Medicine

## 2013-10-20 VITALS — BP 131/81 | HR 93 | Ht 69.0 in | Wt 284.0 lb

## 2013-10-20 DIAGNOSIS — M160 Bilateral primary osteoarthritis of hip: Secondary | ICD-10-CM

## 2013-10-20 DIAGNOSIS — M1610 Unilateral primary osteoarthritis, unspecified hip: Secondary | ICD-10-CM

## 2013-10-20 DIAGNOSIS — IMO0002 Reserved for concepts with insufficient information to code with codable children: Secondary | ICD-10-CM

## 2013-10-20 DIAGNOSIS — M169 Osteoarthritis of hip, unspecified: Secondary | ICD-10-CM

## 2013-10-20 DIAGNOSIS — L02419 Cutaneous abscess of limb, unspecified: Secondary | ICD-10-CM | POA: Insufficient documentation

## 2013-10-20 MED ORDER — CELECOXIB 200 MG PO CAPS: ORAL_CAPSULE | ORAL | Status: DC

## 2013-10-20 MED ORDER — DOXYCYCLINE HYCLATE 100 MG PO TABS: 100.0000 mg | ORAL_TABLET | Freq: Two times a day (BID) | ORAL | Status: AC

## 2013-10-20 MED ORDER — MELOXICAM 15 MG PO TABS: ORAL_TABLET | ORAL | Status: DC

## 2013-10-20 MED ORDER — HYDROCODONE-ACETAMINOPHEN 10-325 MG PO TABS: 1.0000 | ORAL_TABLET | Freq: Every evening | ORAL | Status: DC | PRN

## 2013-10-20 NOTE — Assessment & Plan Note (Signed)
50% response to bilateral intra-articular injection at the last visit. Adding Celebrex, refilling hydrocodone. She does understand that she is to use no more than 30 hydrocodone per month, and if continues to use on top of the Celebrex we will need to consider referral for hip arthroplasty I am also going to provide a prescription for Mobic should Celebrex be too expensive

## 2013-10-20 NOTE — Assessment & Plan Note (Signed)
Tender but not fluctuant. Doxycycline for 7 days, if no better, we will perform an incision and drainage here in the office.

## 2013-10-20 NOTE — Progress Notes (Signed)
  Subjective:    CC: Followup  HPI: Bilateral hip osteoarthritis : Kathryn Cline returns, at the last visit we injected both of her hips, pain relief was instantaneous however she now has only 50% relief of her pain. She is taking hydrocodone really has not been taking any NSAIDs. Pain is localized in the groins, moderate, persistent, she understandably would like to delay total hip arthroplasty as long as possible.  Left underarm lesion: Painful, reddish, swollen.  Past medical history, Surgical history, Family history not pertinant except as noted below, Social history, Allergies, and medications have been entered into the medical record, reviewed, and no changes needed.   Review of Systems: No fevers, chills, night sweats, weight loss, chest pain, or shortness of breath.   Objective:    General: Well Developed, well nourished, and in no acute distress.  Neuro: Alert and oriented x3, extra-ocular muscles intact, sensation grossly intact.  HEENT: Normocephalic, atraumatic, pupils equal round reactive to light, neck supple, no masses, no lymphadenopathy, thyroid nonpalpable.  Skin: Warm and dry, no rashes. There is a left axillary abscess that is not indurated, slightly red, and not fluctuant. Cardiac: Regular rate and rhythm, no murmurs rubs or gallops, no lower extremity edema.  Respiratory: Clear to auscultation bilaterally. Not using accessory muscles, speaking in full sentences.  Impression and Recommendations:

## 2013-11-17 ENCOUNTER — Encounter: Payer: Self-pay | Admitting: Sports Medicine

## 2013-11-17 ENCOUNTER — Ambulatory Visit (INDEPENDENT_AMBULATORY_CARE_PROVIDER_SITE_OTHER): Payer: 59 | Admitting: Sports Medicine

## 2013-11-17 VITALS — BP 136/84 | HR 88 | Ht 69.0 in | Wt 285.0 lb

## 2013-11-17 DIAGNOSIS — M25551 Pain in right hip: Secondary | ICD-10-CM

## 2013-11-17 DIAGNOSIS — E66813 Obesity, class 3: Secondary | ICD-10-CM | POA: Insufficient documentation

## 2013-11-17 DIAGNOSIS — M1610 Unilateral primary osteoarthritis, unspecified hip: Secondary | ICD-10-CM

## 2013-11-17 DIAGNOSIS — M160 Bilateral primary osteoarthritis of hip: Secondary | ICD-10-CM

## 2013-11-17 DIAGNOSIS — M25559 Pain in unspecified hip: Secondary | ICD-10-CM

## 2013-11-17 DIAGNOSIS — M25552 Pain in left hip: Principal | ICD-10-CM

## 2013-11-17 MED ORDER — GLUCOSAMINE-CHONDROITIN 750-600 MG PO CHEW: 2.0000 | CHEWABLE_TABLET | Freq: Two times a day (BID) | ORAL | Status: DC

## 2013-11-17 MED ORDER — DEVILS CLAW ROOT POWD: Status: DC

## 2013-11-17 MED ORDER — CELECOXIB 200 MG PO CAPS: 200.0000 mg | ORAL_CAPSULE | Freq: Two times a day (BID) | ORAL | Status: DC

## 2013-11-17 NOTE — Assessment & Plan Note (Signed)
Only moderate weight loss on phentermine. Referral for bariatric surgery to help unload the hips.

## 2013-11-17 NOTE — Assessment & Plan Note (Signed)
Doing ok 2 months after injection. Celebrex 2x a day helps significantly, refilling. Adding devils claw and glucosamine/chondroitin. Referral for bariatric surgery to unload the joint. We can reinject next month.

## 2013-11-17 NOTE — Progress Notes (Signed)
  Subjective:    CC: Followup  HPI: Bilateral hip osteoarthritis : good response to bilateral femoral acetabular joint injections approximately 2 months ago, also using Celebrex twice a day which is also affected. Currently on phentermine to try and lose weight. Pain is mild, persistent. Not using hydrocodone.  Past medical history, Surgical history, Family history not pertinant except as noted below, Social history, Allergies, and medications have been entered into the medical record, reviewed, and no changes needed.   Review of Systems: No fevers, chills, night sweats, weight loss, chest pain, or shortness of breath.   Objective:    General: Well Developed, well nourished, and in no acute distress.  Neuro: Alert and oriented x3, extra-ocular muscles intact, sensation grossly intact.  HEENT: Normocephalic, atraumatic, pupils equal round reactive to light, neck supple, no masses, no lymphadenopathy, thyroid nonpalpable.  Skin: Warm and dry, no rashes. Cardiac: Regular rate and rhythm, no murmurs rubs or gallops, no lower extremity edema.  Respiratory: Clear to auscultation bilaterally. Not using accessory muscles, speaking in full sentences.  Impression and Recommendations:

## 2013-11-17 NOTE — Assessment & Plan Note (Deleted)
Doing ok 2 months after injection. Celebrex 2x a day helps significantly, refilling. Adding devils claw and glucosamine/chondroitin. Referral for bariatric surgery to unload the joint. We can reinject next month.

## 2013-12-22 ENCOUNTER — Ambulatory Visit (INDEPENDENT_AMBULATORY_CARE_PROVIDER_SITE_OTHER): Payer: 59 | Admitting: Sports Medicine

## 2013-12-22 ENCOUNTER — Encounter: Payer: Self-pay | Admitting: Sports Medicine

## 2013-12-22 VITALS — BP 150/92 | HR 97 | Ht 69.0 in | Wt 286.0 lb

## 2013-12-22 DIAGNOSIS — F329 Major depressive disorder, single episode, unspecified: Secondary | ICD-10-CM

## 2013-12-22 DIAGNOSIS — M1610 Unilateral primary osteoarthritis, unspecified hip: Secondary | ICD-10-CM

## 2013-12-22 DIAGNOSIS — F3289 Other specified depressive episodes: Secondary | ICD-10-CM

## 2013-12-22 DIAGNOSIS — M160 Bilateral primary osteoarthritis of hip: Secondary | ICD-10-CM

## 2013-12-22 DIAGNOSIS — F32A Depression, unspecified: Secondary | ICD-10-CM

## 2013-12-22 MED ORDER — AMITRIPTYLINE HCL 50 MG PO TABS: ORAL_TABLET | ORAL | Status: DC

## 2013-12-22 MED ORDER — DULOXETINE HCL 30 MG PO CPEP: 30.0000 mg | ORAL_CAPSULE | Freq: Every day | ORAL | Status: DC

## 2013-12-22 NOTE — Progress Notes (Signed)
  Subjective:    CC: Bilateral hip pain  HPI: Kathryn Cline has bilateral hip osteoarthritis, her last injection was 3 months ago. She now has recurrence of pain, injection only worked for about 2-1/2 months. She also endorses depressed mood, no suicidal or homicidal ideation, she also has widespread back, shoulder pain. She is on Cymbalta at 60 mg daily but is unable to afford to go up on the dose.  Past medical history, Surgical history, Family history not pertinant except as noted below, Social history, Allergies, and medications have been entered into the medical record, reviewed, and no changes needed.   Review of Systems: No fevers, chills, night sweats, weight loss, chest pain, or shortness of breath.   Objective:    General: Well Developed, well nourished, and in no acute distress.  Neuro: Alert and oriented x3, extra-ocular muscles intact, sensation grossly intact.  HEENT: Normocephalic, atraumatic, pupils equal round reactive to light, neck supple, no masses, no lymphadenopathy, thyroid nonpalpable.  Skin: Warm and dry, no rashes. Cardiac: Regular rate and rhythm, no murmurs rubs or gallops, no lower extremity edema.  Respiratory: Clear to auscultation bilaterally. Not using accessory muscles, speaking in full sentences.  Procedure: Real-time Ultrasound Guided Injection of left femoral acetabular joint Device: GE Logiq E  Verbal informed consent obtained.  Time-out conducted.  Noted no overlying erythema, induration, or other signs of local infection.  Skin prepped in a sterile fashion.  Local anesthesia: Topical Ethyl chloride.  With sterile technique and under real time ultrasound guidance:  Spinal needle advanced to the femoral head/neck junction, 2 cc Kenalog 40, 4 cc lidocaine injected easily.  Completed without difficulty  Pain immediately resolved suggesting accurate placement of the medication.  Advised to call if fevers/chills, erythema, induration, drainage, or persistent  bleeding.  Images permanently stored and available for review in the ultrasound unit.  Impression: Technically successful ultrasound guided injection.  Procedure: Real-time Ultrasound Guided Injection of right femoral acetabular joint Device: GE Logiq E  Verbal informed consent obtained.  Time-out conducted.  Noted no overlying erythema, induration, or other signs of local infection.  Skin prepped in a sterile fashion.  Local anesthesia: Topical Ethyl chloride.  With sterile technique and under real time ultrasound guidance:  Spinal needle advanced to the femoral head/neck junction, 2 cc Kenalog 40, 4 cc lidocaine injected easily.  Completed without difficulty  Pain immediately resolved suggesting accurate placement of the medication.  Advised to call if fevers/chills, erythema, induration, drainage, or persistent bleeding.  Images permanently stored and available for review in the ultrasound unit.  Impression: Technically successful ultrasound guided injection.  Impression and Recommendations:

## 2013-12-22 NOTE — Assessment & Plan Note (Addendum)
I would like to increase her Cymbalta however she tells me she cannot afford to go on the dose, and cannot afford any other SNRIs. 30 mg of Cymbalta daily for a week then stop. Switching to amitriptyline. She can follow this up with her PCP. Recommended a one-month trial 50 mg daily before increasing to 100 or 150 mg daily. Persistent and uncontrolled depression will make it difficult/impossible to eliminate her musculoskeletal pain completely.

## 2013-12-22 NOTE — Assessment & Plan Note (Signed)
Bilateral injection as above. Last injection was 3 months ago. Return as needed.

## 2014-01-05 ENCOUNTER — Other Ambulatory Visit: Payer: Self-pay | Admitting: Physician Assistant

## 2014-01-06 ENCOUNTER — Encounter: Payer: Self-pay | Admitting: Physician Assistant

## 2014-01-06 ENCOUNTER — Ambulatory Visit (INDEPENDENT_AMBULATORY_CARE_PROVIDER_SITE_OTHER): Payer: 59 | Admitting: Physician Assistant

## 2014-01-06 VITALS — BP 155/90 | HR 90 | Ht 69.0 in | Wt 281.0 lb

## 2014-01-06 DIAGNOSIS — F329 Major depressive disorder, single episode, unspecified: Secondary | ICD-10-CM

## 2014-01-06 DIAGNOSIS — F3289 Other specified depressive episodes: Secondary | ICD-10-CM

## 2014-01-06 DIAGNOSIS — IMO0001 Reserved for inherently not codable concepts without codable children: Secondary | ICD-10-CM

## 2014-01-06 DIAGNOSIS — M797 Fibromyalgia: Secondary | ICD-10-CM

## 2014-01-06 DIAGNOSIS — F32A Depression, unspecified: Secondary | ICD-10-CM

## 2014-01-06 MED ORDER — ARIPIPRAZOLE 2 MG PO TABS: 2.0000 mg | ORAL_TABLET | Freq: Every day | ORAL | Status: DC

## 2014-01-06 MED ORDER — LISINOPRIL 20 MG PO TABS: 20.0000 mg | ORAL_TABLET | Freq: Every day | ORAL | Status: DC

## 2014-01-06 MED ORDER — HYDROCODONE-ACETAMINOPHEN 5-325 MG PO TABS: 1.0000 | ORAL_TABLET | Freq: Three times a day (TID) | ORAL | Status: DC | PRN

## 2014-01-10 NOTE — Progress Notes (Signed)
   Subjective:    Patient ID: Kathryn Cline, female    DOB: 21-Feb-1976, 38 y.o.   MRN: [id number]  HPI Pt presents to the clinic to follow up on depression. She is just really down. She hurts all the time and has no motivation. Insurance will not pay for increase in cymbalta. cymbalta has helped for a long time. Denies any suicidal or homicidal thoughts.    Review of Systems  All other systems reviewed and are negative.      Objective:   Physical Exam  Constitutional: She is oriented to person, place, and time. She appears well-developed and well-nourished.  HENT:  Head: Normocephalic and atraumatic.  Cardiovascular: Normal rate, regular rhythm and normal heart sounds.   Pulmonary/Chest: Effort normal and breath sounds normal. She has no wheezes.  Neurological: She is alert and oriented to person, place, and time.  Skin: Skin is dry.  Psychiatric:  Flat affect.           Assessment & Plan:  Depression - continue on cymbalta 67m. Insurance will not pay for increase.  Start abilify. Discussed side effects. If mood changes adversely please stop medication and call office. Follow up in 4-6 weeks.   Chronic pain/fibromyaglia- gave small quanity of norco to use as needed for acute pain. I certainly believe that pain and depression are linked. Working to get depression uncontrol.

## 2014-01-19 ENCOUNTER — Encounter: Payer: Self-pay | Admitting: Sports Medicine

## 2014-01-19 ENCOUNTER — Ambulatory Visit (INDEPENDENT_AMBULATORY_CARE_PROVIDER_SITE_OTHER): Payer: 59 | Admitting: Sports Medicine

## 2014-01-19 VITALS — BP 144/99 | HR 99 | Ht 69.0 in | Wt 280.0 lb

## 2014-01-19 DIAGNOSIS — F3289 Other specified depressive episodes: Secondary | ICD-10-CM

## 2014-01-19 DIAGNOSIS — F32A Depression, unspecified: Secondary | ICD-10-CM

## 2014-01-19 DIAGNOSIS — M1610 Unilateral primary osteoarthritis, unspecified hip: Secondary | ICD-10-CM

## 2014-01-19 DIAGNOSIS — F329 Major depressive disorder, single episode, unspecified: Secondary | ICD-10-CM

## 2014-01-19 DIAGNOSIS — M160 Bilateral primary osteoarthritis of hip: Secondary | ICD-10-CM

## 2014-01-19 MED ORDER — DICLOFENAC SODIUM 75 MG PO TBEC: 75.0000 mg | DELAYED_RELEASE_TABLET | Freq: Two times a day (BID) | ORAL | Status: DC

## 2014-01-19 NOTE — Assessment & Plan Note (Signed)
Improved with injection, switching to diclofenac.

## 2014-01-19 NOTE — Progress Notes (Signed)
  Subjective:    CC:   Followup  HPI:  Bilateral hip osteoarthritis: Improved significantly after bilateral injections but still has some pain.  Depression: Unable to increase dose of Cymbalta, switch to amitriptyline which she was unable to tolerate, she saw her PCP, who restarted Cymbalta and added Abilify overall she feels somewhat , she understands that only 2 weeks and is too early to tell. No suicidal or homicidal ideation.  Past medical history, Surgical history, Family history not pertinant except as noted below, Social history, Allergies, and medications have been entered into the medical record, reviewed, and no changes needed.   Review of Systems: No fevers, chills, night sweats, weight loss, chest pain, or shortness of breath.   Objective:    General: Well Developed, well nourished, and in no acute distress.  Neuro: Alert and oriented x3, extra-ocular muscles intact, sensation grossly intact.  HEENT: Normocephalic, atraumatic, pupils equal round reactive to light, neck supple, no masses, no lymphadenopathy, thyroid nonpalpable.  Skin: Warm and dry, no rashes. Cardiac: Regular rate and rhythm, no murmurs rubs or gallops, no lower extremity edema.  Respiratory: Clear to auscultation bilaterally. Not using accessory muscles, speaking in full sentences.  Impression and Recommendations:

## 2014-01-19 NOTE — Assessment & Plan Note (Signed)
Unable to tolerate amitriptyline. Back on Cymbalta with addition of Abilify 5. I think we need a few more weeks before we can assess response to Abilify, I do like the above regimen. PHQ 9 today, is reasonable to do this at every visit to objectively evaluate her improvement in symptoms.

## 2014-01-20 ENCOUNTER — Telehealth: Payer: Self-pay | Admitting: *Deleted

## 2014-01-20 NOTE — Telephone Encounter (Signed)
Patient aware. Margette Fast, CMA

## 2014-01-20 NOTE — Telephone Encounter (Signed)
Patient called that she was bit in the back of her neck by a spider. She said it was clear in color with black spots. She said that the last time she was bit you sent an antibiotic in for her. She denies SOB but it experiencing a burning sensation in bite area. I informed her to seek medical attention immediately if she developed any life threatening conditions. Margette Fast, CMA

## 2014-01-20 NOTE — Telephone Encounter (Signed)
You really don't need abx unless cellulitis forms around bite. Clean with warm soap and water. Cool compresses. antiobiotic ointment OTC. Tylenol/ibuprofen for pain and inflammation. Go to ER with fever, nausea, diarrhea, vomiting.

## 2014-01-20 NOTE — Telephone Encounter (Signed)
Return call attempted - voicemail not set up. Margette Fast, CMA

## 2014-01-31 ENCOUNTER — Emergency Department (INDEPENDENT_AMBULATORY_CARE_PROVIDER_SITE_OTHER)
Admission: EM | Admit: 2014-01-31 | Discharge: 2014-01-31 | Disposition: A | Discharge status: Home / Self Care | Payer: 59 | Source: Home / Self Care

## 2014-01-31 ENCOUNTER — Encounter: Payer: Self-pay | Admitting: Emergency Medicine

## 2014-01-31 DIAGNOSIS — J029 Acute pharyngitis, unspecified: Secondary | ICD-10-CM

## 2014-01-31 DIAGNOSIS — J0100 Acute maxillary sinusitis, unspecified: Secondary | ICD-10-CM

## 2014-01-31 LAB — POCT RAPID STREP A (OFFICE): RAPID STREP A SCREEN: NEGATIVE

## 2014-01-31 MED ORDER — AMOXICILLIN-POT CLAVULANATE 875-125 MG PO TABS: 1.0000 | ORAL_TABLET | Freq: Two times a day (BID) | ORAL | Status: DC

## 2014-01-31 NOTE — Discharge Instructions (Signed)

## 2014-01-31 NOTE — ED Provider Notes (Signed)
CSN: [id number]     Arrival date & time 01/31/14  1055 History   None    Chief Complaint  Patient presents with  . Sore Throat   (Consider location/radiation/quality/duration/timing/severity/associated sxs/prior Treatment) HPI Pt presents to the clinic with sore throat, headache, sinus pressure and ear pain for 1 weeks. She had a low grade temperature of 99.9 yesterday. She denies any cough, wheezing, SOB. No flu vaccine this year. Tried ibuprofen with little relief. She thought was allergies and would go away. Denies any chills, n/v.  Past Medical History  Diagnosis Date  . Neuropathy   . Fibromyalgia   . GERD (gastroesophageal reflux disease)   . Allergy    Past Surgical History  Procedure Laterality Date  . Appendectomy    . Abdominal hysterectomy    . Wrist surgery    . Left foot surgery    . Cyst removal trunk     Family History  Problem Relation Age of Onset  . Heart attack Brother   . Hypertension Brother   . Heart attack Maternal Grandmother   . Hypertension Maternal Grandmother   . Cancer Maternal Grandfather   . Heart attack Maternal Grandfather   . Hypertension Maternal Grandfather   . Cancer Paternal Grandmother   . Heart attack Paternal Grandmother   . Hypertension Paternal Grandmother   . Stroke Paternal Grandmother   . Heart attack Paternal Grandfather   . Hypertension Paternal Grandfather   . Hypertension Brother    History  Substance Use Topics  . Smoking status: Current Every Day Smoker  . Smokeless tobacco: Not on file  . Alcohol Use: No   OB History   Grav Para Term Preterm Abortions TAB SAB Ect Mult Living                 Review of Systems  All other systems reviewed and are negative.   Allergies  Amitriptyline; Azithromycin; Bupropion; Gabapentin; Lyrica; and Vicodin  Home Medications   Prior to Admission medications   Medication Sig Start Date End Date Taking? Authorizing Provider  AMBULATORY NON FORMULARY MEDICATION Stress  B-complex    Historical Provider, MD  amoxicillin-clavulanate (AUGMENTIN) 875-125 MG per tablet Take 1 tablet by mouth 2 (two) times daily. 01/31/14   [name] [name], PA-C  ARIPiprazole (ABILIFY) 2 MG tablet Take 1 tablet (2 mg total) by mouth daily. 01/06/14   [name] [name], PA-C  cetirizine (ZYRTEC) 10 MG tablet Take 10 mg by mouth daily.    Historical Provider, MD  cholecalciferol (VITAMIN D) 1000 UNITS tablet Take 3,000 Units by mouth daily.    Historical Provider, MD  diclofenac (VOLTAREN) 75 MG EC tablet Take 1 tablet (75 mg total) by mouth 2 (two) times daily. 01/19/14   [name], MD  diclofenac sodium (VOLTAREN) 1 % GEL Apply 4 g topically 4 (four) times daily. 08/19/13   [name] [name], PA-C  DULoxetine (CYMBALTA) 30 MG capsule Take 60 mg by mouth daily. One tab daily for a week then stop 12/22/13   [name], MD  esomeprazole (NEXIUM) 40 MG capsule Take 1 capsule (40 mg total) by mouth daily at 12 noon. 09/16/13   [name] [name], PA-C  furosemide (LASIX) 40 MG tablet TAKE ONE TABLET BY MOUTH ONCE DAILY 01/05/14   Donella Stade, PA-C  HYDROcodone-acetaminophen (NORCO/VICODIN) 5-325 MG per tablet Take 1 tablet by mouth every 8 (eight) hours as needed for moderate pain. 01/06/14   [name] [name], PA-C  lisinopril (  PRINIVIL,ZESTRIL) 20 MG tablet Take 1 tablet (20 mg total) by mouth daily. 01/06/14   [name] [name], PA-C  promethazine (PHENERGAN) 25 MG tablet Take 1 tablet (25 mg total) by mouth every 6 (six) hours as needed for nausea or vomiting. 08/19/13   [name] [name], PA-C  rizatriptan (MAXALT) 5 MG tablet Take 5 mg by mouth as needed for migraine. May repeat in 2 hours if needed    Historical Provider, MD   BP 127/88  Pulse 93  Temp(Src) 98.1 F (36.7 C) (Oral)  Resp 18  Wt 286 lb (129.729 kg)  SpO2 98% Physical Exam  Constitutional: She is oriented to person, place, and time. She appears well-developed and well-nourished.  HENT:  Head: Normocephalic and  atraumatic.  Right Ear: External ear normal.  Left Ear: External ear normal.  Nose: Nose normal.  Mouth/Throat: Oropharynx is clear and moist.  TM's clear bilaterally.   Maxillary sinuses tender to palpitation.   Oropharynx erythematous without any tonsilar swelling or exudate.   Bilateral turbinates red and swollen.   Eyes: Conjunctivae are normal. Right eye exhibits no discharge. Left eye exhibits no discharge.  Neck: Normal range of motion. Neck supple.  Tender swollen adenopathy.   Cardiovascular: Normal rate, regular rhythm and normal heart sounds.   Pulmonary/Chest: Effort normal and breath sounds normal. She has no wheezes.  Lymphadenopathy:    She has cervical adenopathy.  Neurological: She is alert and oriented to person, place, and time.  Psychiatric: She has a normal mood and affect. Her behavior is normal.    ED Course  Procedures (including critical care time) Labs Review Labs Reviewed  POCT RAPID STREP A (OFFICE)    Imaging Review No results found.   MDM   1. Acute maxillary sinusitis, recurrence not specified   2. Acute pharyngitis, unspecified pharyngitis type    Rapid strep negative. Treated with Augmentin for 10 days.  Discussed Ibuprofen as needed for pain.  HO given.  Follow up with PCP in 5-7 days if not improving.     Donella Stade, PA-C 01/31/14 1159  Jade Delila Pereyra, PA-C 01/31/14 1200

## 2014-01-31 NOTE — ED Notes (Signed)
C/o sore throat x 1 week, HA, ear pain and low grade temp x yesterday. No flu vac this season.

## 2014-02-03 ENCOUNTER — Ambulatory Visit (INDEPENDENT_AMBULATORY_CARE_PROVIDER_SITE_OTHER): Payer: 59 | Admitting: Physician Assistant

## 2014-02-03 ENCOUNTER — Other Ambulatory Visit: Payer: Self-pay | Admitting: Physician Assistant

## 2014-02-03 ENCOUNTER — Encounter: Payer: Self-pay | Admitting: Physician Assistant

## 2014-02-03 VITALS — BP 127/77 | HR 95 | Ht 69.0 in | Wt 283.0 lb

## 2014-02-03 DIAGNOSIS — F329 Major depressive disorder, single episode, unspecified: Secondary | ICD-10-CM

## 2014-02-03 DIAGNOSIS — L989 Disorder of the skin and subcutaneous tissue, unspecified: Secondary | ICD-10-CM

## 2014-02-03 DIAGNOSIS — M797 Fibromyalgia: Secondary | ICD-10-CM

## 2014-02-03 DIAGNOSIS — F32A Depression, unspecified: Secondary | ICD-10-CM

## 2014-02-03 MED ORDER — AMBULATORY NON FORMULARY MEDICATION: Status: DC

## 2014-02-03 MED ORDER — ARIPIPRAZOLE 5 MG PO TABS: 5.0000 mg | ORAL_TABLET | Freq: Every day | ORAL | Status: DC

## 2014-02-03 NOTE — Patient Instructions (Signed)
Biopsy Care After Refer to this sheet in the next few weeks. These instructions provide you with information on caring for yourself after your procedure. Your caregiver may also give you more specific instructions. Your treatment has been planned according to current medical practices, but problems sometimes occur. Call your caregiver if you have any problems or questions after your procedure. If you had a fine needle biopsy, you may have soreness at the biopsy site for 1 to 2 days. If you had an open biopsy, you may have soreness at the biopsy site for 3 to 4 days. HOME CARE INSTRUCTIONS   You may resume normal diet and activities as directed.  Change bandages (dressings) as directed. If your wound was closed with a skin glue (adhesive), it will wear off and begin to peel in 7 days.  Only take over-the-counter or prescription medicines for pain, discomfort, or fever as directed by your caregiver.  Ask your caregiver when you can bathe and get your wound wet. SEEK IMMEDIATE MEDICAL CARE IF:   You have increased bleeding (more than a small spot) from the biopsy site.  You notice redness, swelling, or increasing pain at the biopsy site.  You have pus coming from the biopsy site.  You have a fever.  You notice a bad smell coming from the biopsy site or dressing.  You have a rash, have difficulty breathing, or have any allergic problems. MAKE SURE YOU:   Understand these instructions.  Will watch your condition.  Will get help right away if you are not doing well or get worse. Document Released: 11/08/2004 Document Revised: 07/14/2011 Document Reviewed: 10/17/2010 [facility] Patient Information 2015 [place], [place]. This information is not intended to replace advice given to you by your health care provider. Make sure you discuss any questions you have with your health care provider.

## 2014-02-06 NOTE — Progress Notes (Signed)
   Subjective:    Patient ID: Kathryn Cline, female    DOB: 09-09-1975, 38 y.o.   MRN: [id number]  HPI Pt presents to the clinic to follow up on abilify addition to cymbalta. She has only been on abilify 14m not 51010m She notices that she does feel somewhat less depressed. She has noticed very minimal response with pain. She still admits that she feels like she needs more. She continues to be very down and has no motivation. Denies any suicidal thoughts or homicidal thoughts.   Pt does have suspcious lesion of left upper arm to be removed.    Review of Systems  All other systems reviewed and are negative.      Objective:   Physical Exam  Constitutional: She is oriented to person, place, and time. She appears well-developed and well-nourished.  HENT:  Head: Normocephalic.  Cardiovascular: Normal rate, regular rhythm and normal heart sounds.   Pulmonary/Chest: Effort normal and breath sounds normal. She has no wheezes.  Neurological: She is alert and oriented to person, place, and time.  Skin: Skin is dry.  Psychiatric:  Flat affect.           Assessment & Plan:  Depression- PHQ-9 was 16. Increased abilify to 10m49mFollow up in 4 weeks. Continue on cymbalta.   Atypical lesion- Shave Biopsy Procedure Note  Pre-operative Diagnosis: Suspicious lesion BCC vs dermatofibroma  Post-operative Diagnosis: same  Locations: left upper arm  Indications: irritation sucpiscous  Anesthesia: ethyl chloride  Procedure Details  History of allergy to iodine: no  Patient informed of the risks (including bleeding and infection) and benefits of the  procedure and Verbal informed consent obtained.  The lesion and surrounding area were given a sterile prep using alcohol and draped in the usual sterile fashion. A scalpel was used to shave an area of skin approximately 10mm64m 10mm.41memostasis achieved with alumuninum chloride. Antibiotic ointment and a sterile dressing applied.  The specimen  was sent for pathologic examination. The patient tolerated the procedure well.  EBL:  Scant blood   Condition: Stable  Complications: none.  Plan: 1. Instructed to keep the wound dry and covered for 24-48h and clean thereafter. 2. Warning signs of infection were reviewed.   3. Recommended that the patient use OTC acetaminophen as needed for pain.

## 2014-02-13 ENCOUNTER — Telehealth: Payer: Self-pay

## 2014-02-13 DIAGNOSIS — M1612 Unilateral primary osteoarthritis, left hip: Secondary | ICD-10-CM

## 2014-02-13 NOTE — Telephone Encounter (Signed)
We are at the point now where she needs to consider surgical intervention. I can do a referral to [facility] or [facility], they are usually more able to work with uninsured patients. Let me know what she thinks.

## 2014-02-13 NOTE — Telephone Encounter (Signed)
Patient agrees with the referral for surgery. Could she have pain medication until the appointment. The hydrocodone 1 tablet every 8 hours is not helping.

## 2014-02-13 NOTE — Telephone Encounter (Signed)
Kathryn Cline reports she is still having pain in her left hip, back and legs. The hydrocodone and diclofenac is not helping with the pain. She wanted to know the next step?

## 2014-02-13 NOTE — Telephone Encounter (Signed)
No pain medicine. Referral placed to [facility].

## 2014-02-16 NOTE — Telephone Encounter (Signed)
Unable to reach patient.

## 2014-02-23 NOTE — ED Provider Notes (Signed)
Agree with exam, assessment, and plan.   Kandra Nicolas, MD 02/23/14 [phone]

## 2014-03-03 ENCOUNTER — Ambulatory Visit: Payer: 59 | Admitting: Physician Assistant

## 2014-03-18 ENCOUNTER — Other Ambulatory Visit: Payer: Self-pay | Admitting: Physician Assistant

## 2014-03-23 ENCOUNTER — Telehealth: Payer: Self-pay | Admitting: *Deleted

## 2014-03-23 NOTE — Telephone Encounter (Signed)
Kathryn Cline calls today and states that she needs something for pain. I see in chart that she was referred for ortho in Oct but she claims that she was never notified and this was the time she was having problems with her home phone. The last phone note does say that she was unreachable. Can another referral be placed and can she have something for pain or any suggestions. Please advise. Margette Fast, CMA

## 2014-03-24 NOTE — Telephone Encounter (Signed)
Ask pt if she would like tramadol for pain.   Anderson Malta see Cherylann Banas note can we place new referral for ortho to get pt in.

## 2014-03-24 NOTE — Telephone Encounter (Signed)
I spoke with patient and she is allergic to tramadol so [name] said to set up for another referral for ortho asap.  Make sure to call cell#

## 2014-03-28 NOTE — Telephone Encounter (Signed)
Patient wants a refill on hydrocodone. I advised patient we will need a note from the Ortho stating her need for narcotic pain medication. We will give her a refill of 20 tablets, per [name], if we receive a recommendations form Ortho.

## 2014-03-28 NOTE — Telephone Encounter (Signed)
Patient advised of referral to Ortho and gave number for her to call. She also wanted a referral to pain management. Please advise.

## 2014-03-28 NOTE — Telephone Encounter (Signed)
[facility] for referral to pain management.

## 2014-03-29 ENCOUNTER — Ambulatory Visit: Payer: 59 | Admitting: Physician Assistant

## 2014-04-14 ENCOUNTER — Ambulatory Visit (INDEPENDENT_AMBULATORY_CARE_PROVIDER_SITE_OTHER): Payer: 59 | Admitting: Family Medicine

## 2014-04-14 ENCOUNTER — Encounter: Payer: Self-pay | Admitting: Family Medicine

## 2014-04-14 VITALS — BP 131/93 | HR 89 | Temp 97.9°F | Wt 294.0 lb

## 2014-04-14 DIAGNOSIS — F329 Major depressive disorder, single episode, unspecified: Secondary | ICD-10-CM

## 2014-04-14 DIAGNOSIS — M797 Fibromyalgia: Secondary | ICD-10-CM

## 2014-04-14 DIAGNOSIS — F32A Depression, unspecified: Secondary | ICD-10-CM

## 2014-04-14 DIAGNOSIS — M5440 Lumbago with sciatica, unspecified side: Secondary | ICD-10-CM

## 2014-04-14 DIAGNOSIS — M533 Sacrococcygeal disorders, not elsewhere classified: Secondary | ICD-10-CM

## 2014-04-14 DIAGNOSIS — M545 Low back pain, unspecified: Secondary | ICD-10-CM

## 2014-04-14 MED ORDER — DULOXETINE HCL 30 MG PO CPEP: 30.0000 mg | ORAL_CAPSULE | Freq: Every day | ORAL | Status: DC

## 2014-04-14 MED ORDER — HYDROCODONE-ACETAMINOPHEN 5-325 MG PO TABS: 1.0000 | ORAL_TABLET | Freq: Three times a day (TID) | ORAL | Status: DC | PRN

## 2014-04-14 MED ORDER — VILAZODONE HCL 10 & 20 & 40 MG PO KIT: PACK | ORAL | Status: DC

## 2014-04-14 NOTE — Progress Notes (Signed)
Subjective:    Patient ID: Kathryn Cline, female    DOB: 09-19-1975, 38 y.o.   MRN: [id number]  HPI  Patient complains of back pain that's radiating down her leg on the left side.Has been present for several months.  She was actually seen about 3 months ago for bilateral hip pain by our sports medicine doctor and given bilateral steroid injections. She says movement exacerbates her symptoms. She rates her pain as a 9 out of 10 and says it's constant and sharp. She did bring in a letter from the orthopedist, Dr. Domenic Cline at [facility]. There are traction going to evaluate her further with an MRI. He wrote a letter requesting that she receive pain medications from her primary care provider until they are able to further evaluate her or possibly refer her to pain management. Gtting more sharp pain.Taking meloxicam and the diclofenac together. She did have a small quantity of hydrocodone.  She's been crying all day and is very tearful. Her mom is here with her today and is very worried about her mental health and state. She's not been getting out of the bed except to pay bills and do a couple of things around the home. She does have a 64-year-old daughter at home. And she lives with her mother and husband. She complains of feeling down and depressed and hopeless nearly every day. She has negative thoughts about herself. And she complains of having thoughts of being better off dead and hurting herself. She denies any homicidal thoughts. Her mother is here and is very supportive. She had tried Wellbutrin the past but it worsened her migraines and she is also tried Zoloft in the past but says it was not effective.  Review of Systems     Objective:   Physical Exam  Constitutional: She is oriented to person, place, and time. She appears well-developed and well-nourished.  HENT:  Head: Normocephalic and atraumatic.  Cardiovascular: Normal rate, regular rhythm and normal heart sounds.    Pulmonary/Chest: Effort normal and breath sounds normal.  Musculoskeletal:  Decreased lumbar flexion and extension. She has pain in both directions. She has normal rotation right and left but has more discomfort turning to the right to the left. She is mildly tender over the sacrum in the distal lumbar spine. She also has some mild paraspinous muscle tenderness. Negative straight leg raise bilaterally. Hip, knee, ankle strength is 5 out of 5 bilaterally.  Neurological: She is alert and oriented to person, place, and time.  Skin: Skin is warm and dry.  Psychiatric: She has a normal mood and affect. Her behavior is normal.          Assessment & Plan:  Severe depression - PHQ - 9 socre of 27.  Symptoms are severe. We discussed her safety especially with having thoughts of suicide. We discussed her going to the hospital and being admitted versus being at home and adjusting her medication regimen in getting her into therapy very promptly. After long discussion between her and her mother we decided that she felt fairly safe to go home with her mom who is their around-the-clock. We are going to wean the Cymbalta that she has been on for the last several years and switch her to [place]. We may eventually wean the Abilify as well as a feeling like it's not helping or being effective and can cause weight gain and increased blood sugars. For now we will keep the Abilify and just work on switching the Cymbalta.  We will try to get her in with therapy as soon as possible, preferably next week. If any point she feels like she might harm herself that I recommend that she go to the nearest emergency department for evaluation and possible inpatient admission.  I asked her to f/U in 2 weeks.     Back pain radiating into legs-I will go ahead and write for pain medication for her to control her symptoms. She rates them as severe. She is still undergoing a workup with the orthopedist at [facility] and has an MRI  scheduled for next week. I will go ahead and give her 90 tabs of hydrocodone to use up to 3 times a day. She will follow back up in one month. If they are going to do something surgical that the orthopedist will take of the prescription for 90 days. If not then we will need to consider referring her to pain management for further treatment and evaluation. I discussed this with her mother here and she is very clear and understands the expectation that we will not prescribe narcotics for her long-term. To be safe I encouraged her mother to keep the narcotic pain prescription and give them to [place]. Do not feel safe with Kathryn Cline having the whole bottle herself especially while having suicidal thoughts.  Time spent 45 minutes, greater than 50% time spent counseling about severe depression, suicidal thoughts and treatment for pain control.

## 2014-04-17 ENCOUNTER — Other Ambulatory Visit: Payer: Self-pay | Admitting: Family Medicine

## 2014-04-17 MED ORDER — VILAZODONE HCL 10 & 20 MG PO KIT: 20.0000 mg | PACK | Freq: Every day | ORAL | Status: DC

## 2014-04-20 ENCOUNTER — Other Ambulatory Visit: Payer: Self-pay | Admitting: Physician Assistant

## 2014-05-01 ENCOUNTER — Ambulatory Visit: Payer: 59 | Admitting: Family Medicine

## 2014-05-02 ENCOUNTER — Encounter: Payer: Self-pay | Admitting: Physician Assistant

## 2014-05-02 ENCOUNTER — Ambulatory Visit (INDEPENDENT_AMBULATORY_CARE_PROVIDER_SITE_OTHER): Payer: 59 | Admitting: Physician Assistant

## 2014-05-02 VITALS — BP 128/91 | HR 97 | Ht 69.0 in | Wt 302.0 lb

## 2014-05-02 DIAGNOSIS — T50905D Adverse effect of unspecified drugs, medicaments and biological substances, subsequent encounter: Secondary | ICD-10-CM

## 2014-05-02 DIAGNOSIS — T887XXD Unspecified adverse effect of drug or medicament, subsequent encounter: Secondary | ICD-10-CM

## 2014-05-02 DIAGNOSIS — R059 Cough, unspecified: Secondary | ICD-10-CM

## 2014-05-02 DIAGNOSIS — R05 Cough: Secondary | ICD-10-CM

## 2014-05-02 DIAGNOSIS — F329 Major depressive disorder, single episode, unspecified: Secondary | ICD-10-CM

## 2014-05-02 DIAGNOSIS — F32A Depression, unspecified: Secondary | ICD-10-CM

## 2014-05-02 MED ORDER — DIAZEPAM 5 MG PO TABS: ORAL_TABLET | ORAL | Status: DC

## 2014-05-02 MED ORDER — LEVOMILNACIPRAN HCL ER 20 & 40 MG PO C4PK: EXTENDED_RELEASE_CAPSULE | ORAL | Status: DC

## 2014-05-02 NOTE — Progress Notes (Signed)
   Subjective:    Patient ID: Kathryn Cline, female    DOB: 1976-03-29, 38 y.o.   MRN: [id number]  HPI  Pt presents to the clinic to follow up from ER  On 04/29/14 visit and medication reaction to viibryd. She had bloating, swelling, SOB and chest pain after starting viibryd for 5 days. ER did complete cardiac work up, CTA, blood work which was negative. She has not had any medication for depression except abilify for last 3 days. Reaction side effects are better but still having a lot of depression. She stopped cymbalta to try viibrd. She denies any suicidal or homicidal thoughts.   Cough for last couple of days. It has been productive at times. No fever, chills, SOB, wheezing,  sinus pressure, ear pain. Not tried anything to make better. Worse in mornings.      Review of Systems  All other systems reviewed and are negative.      Objective:   Physical Exam  Constitutional: She is oriented to person, place, and time. She appears well-developed and well-nourished.  HENT:  Head: Normocephalic and atraumatic.  Right Ear: External ear normal.  Left Ear: External ear normal.  Nose: Nose normal.  Mouth/Throat: Oropharynx is clear and moist.  Eyes: Conjunctivae are normal.  Neck: Normal range of motion. Neck supple.  Cardiovascular: Normal rate, regular rhythm and normal heart sounds.   Pulmonary/Chest: Effort normal and breath sounds normal. She has no wheezes.  Lymphadenopathy:    She has no cervical adenopathy.  Neurological: She is alert and oriented to person, place, and time.  Skin: Skin is dry.  Psychiatric: She has a normal mood and affect. Her behavior is normal.          Assessment & Plan:  Depression- did not recheck PHQ-9 due to not being on therapy. Will start fetizema starter back. Coupon given. Discussed side effects and to stop medication if have side effects can also take 47m of benadryl. Follow up in 4-6 weeks will recheck PHQ-9 then. If worsening depression  please let [place].   Cough- no signs of infection today. Likely allergies. Try anti-histamine and some mucinex to help loosen up mucus. Follow up as needed.

## 2014-05-03 ENCOUNTER — Telehealth: Payer: Self-pay | Admitting: *Deleted

## 2014-05-03 NOTE — Telephone Encounter (Signed)
Prior auth inititiated for fetzima. 24-72 hour decision.

## 2014-05-04 MED ORDER — CLONAZEPAM 0.5 MG PO TABS: 0.2500 mg | ORAL_TABLET | Freq: Every day | ORAL | Status: DC | PRN

## 2014-05-04 NOTE — Telephone Encounter (Signed)
Patient would like some fast acting medication for her anxiety until she can pick up the [place]. I called to get up date on the PA. [facility] states they have until the 2 nd to review PA.

## 2014-05-04 NOTE — Telephone Encounter (Signed)
Patient aware and faxed medication to pharmacy.

## 2014-05-04 NOTE — Telephone Encounter (Signed)
Will send in small rx for clonazepam for prn Use.

## 2014-05-08 NOTE — Telephone Encounter (Signed)
I initiated appeal for prior auth since Kathryn Cline was denied. This was made Urgent and decision to be made in 24 hrs.

## 2014-05-09 ENCOUNTER — Telehealth: Payer: Self-pay | Admitting: *Deleted

## 2014-05-09 NOTE — Telephone Encounter (Signed)
Fetzima approval received today from [facility]

## 2014-05-22 ENCOUNTER — Other Ambulatory Visit: Payer: Self-pay | Admitting: Physician Assistant

## 2014-05-22 ENCOUNTER — Other Ambulatory Visit: Payer: Self-pay | Admitting: Sports Medicine

## 2014-05-22 ENCOUNTER — Other Ambulatory Visit: Payer: Self-pay | Admitting: Family Medicine

## 2014-05-24 ENCOUNTER — Telehealth: Payer: Self-pay

## 2014-05-24 NOTE — Telephone Encounter (Signed)
Patient called and left a message wanting a referral to the pain clinic.

## 2014-05-24 NOTE — Telephone Encounter (Signed)
Ok to make for fibromyalgia, neuropathy, low back pain.

## 2014-05-29 ENCOUNTER — Other Ambulatory Visit: Payer: Self-pay | Admitting: *Deleted

## 2014-05-29 ENCOUNTER — Other Ambulatory Visit: Payer: Self-pay | Admitting: Physician Assistant

## 2014-05-29 ENCOUNTER — Telehealth: Payer: Self-pay

## 2014-05-29 DIAGNOSIS — M5440 Lumbago with sciatica, unspecified side: Secondary | ICD-10-CM

## 2014-05-29 DIAGNOSIS — M797 Fibromyalgia: Secondary | ICD-10-CM

## 2014-05-29 DIAGNOSIS — M545 Low back pain, unspecified: Secondary | ICD-10-CM

## 2014-05-29 DIAGNOSIS — G629 Polyneuropathy, unspecified: Secondary | ICD-10-CM

## 2014-05-29 MED ORDER — HYDROCODONE-ACETAMINOPHEN 5-325 MG PO TABS: 1.0000 | ORAL_TABLET | Freq: Three times a day (TID) | ORAL | Status: DC | PRN

## 2014-05-29 NOTE — Telephone Encounter (Signed)
Ok to bridge while waiting for referral. I thought had already been placed but saw it wasn't. Placed today.

## 2014-05-29 NOTE — Telephone Encounter (Signed)
rx printed & placed up front.  Pt notified.

## 2014-05-29 NOTE — Telephone Encounter (Signed)
Patient is waiting for referral to pain management. She would like a refill of hydrocodone in the interim. Please advise.

## 2014-06-09 ENCOUNTER — Ambulatory Visit (INDEPENDENT_AMBULATORY_CARE_PROVIDER_SITE_OTHER): Payer: 59 | Admitting: Physician Assistant

## 2014-06-09 ENCOUNTER — Encounter: Payer: Self-pay | Admitting: Physician Assistant

## 2014-06-09 VITALS — BP 131/73 | HR 98 | Ht 69.0 in | Wt 302.0 lb

## 2014-06-09 DIAGNOSIS — F329 Major depressive disorder, single episode, unspecified: Secondary | ICD-10-CM

## 2014-06-09 DIAGNOSIS — F32A Depression, unspecified: Secondary | ICD-10-CM

## 2014-06-09 DIAGNOSIS — M545 Low back pain, unspecified: Secondary | ICD-10-CM

## 2014-06-09 DIAGNOSIS — M5440 Lumbago with sciatica, unspecified side: Secondary | ICD-10-CM

## 2014-06-09 DIAGNOSIS — M797 Fibromyalgia: Secondary | ICD-10-CM

## 2014-06-09 MED ORDER — OLANZAPINE 10 MG PO TABS: 10.0000 mg | ORAL_TABLET | Freq: Every day | ORAL | Status: DC

## 2014-06-09 MED ORDER — VENLAFAXINE HCL ER 37.5 MG PO CP24: 37.5000 mg | ORAL_CAPSULE | Freq: Every day | ORAL | Status: DC

## 2014-06-09 MED ORDER — CYCLOBENZAPRINE HCL 10 MG PO TABS: 10.0000 mg | ORAL_TABLET | Freq: Three times a day (TID) | ORAL | Status: DC | PRN

## 2014-06-09 MED ORDER — PANTOPRAZOLE SODIUM 40 MG PO TBEC: 40.0000 mg | DELAYED_RELEASE_TABLET | Freq: Two times a day (BID) | ORAL | Status: DC

## 2014-06-09 NOTE — Patient Instructions (Addendum)
Note for fetizema.  Pain clinic information.  Verdene Lennert contact for records.  [facility] contact for records.

## 2014-06-09 NOTE — Progress Notes (Signed)
   Subjective:    Patient ID: Kathryn Cline, female    DOB: 1976-02-17, 39 y.o.   MRN: [id number]  HPI  Pt presents to the clinic with worsening depression. She never could afford fetizema even with card. She was on abilify but now increased price to 800 dollars a month. abilify was working some but now has not been on since beginning of January. She is also in worsening pain and we have been giving her norco. She had pain management referral but states "no one will take her". She was sent to Dr. Legrand [place]o [place] for evaluation of back pain. Per pt didn't do anything and didn't tell her the next steps.     Review of Systems  All other systems reviewed and are negative.      Objective:   Physical Exam  Constitutional: She is oriented to person, place, and time. She appears well-developed and well-nourished.  Obese.   HENT:  Head: Normocephalic and atraumatic.  Cardiovascular: Normal rate, regular rhythm and normal heart sounds.   Pulmonary/Chest: Effort normal and breath sounds normal.  Neurological: She is alert and oriented to person, place, and time.  Psychiatric: She has a normal mood and affect. Her behavior is normal.  Flat affect.           Assessment & Plan:  Depression- PHQ-9 was 24. Pt denies sucidal thoughts. Will try zyprexa to replace abilify. Let me know if cannot afford. She does not remember trying effexor. cymbalta did not really help and cannot afford fetizema. Will try. I would like for pt to start seeing psychiatry downstairs. Follow up if not in psych by then in 4 weeks.   Low back pain with radiation- need to call Dr. Trena Platt office as well as Spine clinic and see what note says about work up and follow up. As far as pain medication will go month by month as we get more information. Also sending a note to nurse to find out what is going with pain clinic referral.

## 2014-06-12 ENCOUNTER — Telehealth: Payer: Self-pay

## 2014-06-12 NOTE — Telephone Encounter (Signed)
Arryn called and wants to give information about which pain management is covered by insurance. She did not leave a list on the message. Could you please call her.

## 2014-06-13 ENCOUNTER — Telehealth: Payer: Self-pay | Admitting: Physician Assistant

## 2014-06-13 NOTE — Telephone Encounter (Signed)
[place] with either provider.

## 2014-06-13 NOTE — Telephone Encounter (Signed)
i see note below trying to get pt in pain clinic? I wanted to check and see what is going on?  Can we get records from Dr. Waynette Buttery and spine clinic pt tells me nothing is being done but I want to see what note says.

## 2014-06-13 NOTE — Telephone Encounter (Signed)
Patient states she never saw Dr Waynette Buttery. He reviewed her chart and decided he would not be able to help her and that she will need pain management.   The in network providers for pain managemant are;  Beverlyn Roux, MD [phone] [place] [phone]

## 2014-06-13 NOTE — Telephone Encounter (Signed)
Janne Lab I have sent today  for records for Ms. Moeller from her visit with Dr. Graciela Husbands.

## 2014-06-13 NOTE — Telephone Encounter (Signed)
Thank you :)

## 2014-06-14 NOTE — Telephone Encounter (Signed)
Gave this referral back to Mardene Celeste to fix since she was the one working on this referral

## 2014-06-19 ENCOUNTER — Encounter: Payer: Self-pay | Admitting: Physician Assistant

## 2014-06-19 DIAGNOSIS — M5442 Lumbago with sciatica, left side: Secondary | ICD-10-CM | POA: Insufficient documentation

## 2014-06-26 ENCOUNTER — Ambulatory Visit (INDEPENDENT_AMBULATORY_CARE_PROVIDER_SITE_OTHER): Payer: 59 | Admitting: Sports Medicine

## 2014-06-26 ENCOUNTER — Encounter: Payer: Self-pay | Admitting: Sports Medicine

## 2014-06-26 ENCOUNTER — Telehealth: Payer: Self-pay

## 2014-06-26 ENCOUNTER — Ambulatory Visit (HOSPITAL_BASED_OUTPATIENT_CLINIC_OR_DEPARTMENT_OTHER)
Admission: RE | Admit: 2014-06-26 | Discharge: 2014-06-26 | Disposition: A | Discharge status: Home / Self Care | Payer: 59 | Source: Ambulatory Visit | Attending: Sports Medicine | Admitting: Sports Medicine

## 2014-06-26 ENCOUNTER — Encounter (HOSPITAL_BASED_OUTPATIENT_CLINIC_OR_DEPARTMENT_OTHER): Payer: Self-pay

## 2014-06-26 VITALS — BP 137/68 | HR 99 | Ht 69.0 in | Wt 302.0 lb

## 2014-06-26 DIAGNOSIS — R109 Unspecified abdominal pain: Secondary | ICD-10-CM | POA: Diagnosis not present

## 2014-06-26 DIAGNOSIS — Z9049 Acquired absence of other specified parts of digestive tract: Secondary | ICD-10-CM | POA: Insufficient documentation

## 2014-06-26 DIAGNOSIS — K760 Fatty (change of) liver, not elsewhere classified: Secondary | ICD-10-CM | POA: Diagnosis not present

## 2014-06-26 DIAGNOSIS — R112 Nausea with vomiting, unspecified: Secondary | ICD-10-CM | POA: Insufficient documentation

## 2014-06-26 DIAGNOSIS — E860 Dehydration: Secondary | ICD-10-CM

## 2014-06-26 HISTORY — DX: Essential (primary) hypertension: I10

## 2014-06-26 MED ORDER — ONDANSETRON HCL 4 MG/2ML IJ SOLN
4.0000 mg | Freq: Once | INTRAMUSCULAR | Status: AC
  Administered 2014-06-26: 4 mg via INTRAVENOUS

## 2014-06-26 MED ORDER — PROMETHAZINE HCL 25 MG/ML IJ SOLN
25.0000 mg | Freq: Once | INTRAMUSCULAR | Status: AC
  Administered 2014-06-26: 25 mg via INTRAVENOUS

## 2014-06-26 MED ORDER — PROMETHAZINE HCL 25 MG PO TABS: 25.0000 mg | ORAL_TABLET | Freq: Four times a day (QID) | ORAL | Status: DC | PRN

## 2014-06-26 MED ORDER — IOHEXOL 300 MG/ML  SOLN
100.0000 mL | Freq: Once | INTRAMUSCULAR | Status: AC | PRN
  Administered 2014-06-26: 100 mL via INTRAVENOUS

## 2014-06-26 NOTE — Progress Notes (Signed)
  Subjective:    CC: nausea and vomiting  HPI:  Patient presents with 3 weeks of worsening nausea and vomiting. The onset of these symptoms coincided with her starting a new medication: Venlafaxine. She continued taking venlafaxine and increasing the dose as prescribed despite the worsening nausea. Over the past two days the patient has been unable to keep down solid or liquid alimentation. She reports that she has GERD at baseline but these symptoms are unlike prior reflux. She reports that her "stomach feels raw". She denies fever, chills, chest pain, SOB, diarrhea, constipation, blood in stool, dark tarry stool, hematemesis, dysuria, hematuria.  Past medical history, Surgical history, Family history not pertinant except as noted below, Social history, Allergies, and medications have been entered into the medical record, reviewed, and no changes needed.   Review of Systems: Negative except as listed in the HPI.  Objective:    General: obese, ill-appearing female sitting in chair. Neuro: Alert and oriented x3, extra-ocular muscles intact, sensation grossly intact.  HEENT: Normocephalic, atraumatic, pupils equal round reactive to light. Sclerae anicteric. Mucous membranes tacky and lips pale.  Skin: Warm and dry, no rashes. No jaundice. Cardiac: Regular rate and rhythm, no murmurs rubs or gallops, no lower extremity edema.  Respiratory: Clear to auscultation bilaterally. Not using accessory muscles, speaking in full sentences. Abdomen: Diffuse tenderness to palpation and guarding, especially over epigastric region, no rebound tenderness, no palpable masses.  20-gauge Angiocath placed into the left cephalic vein by Dr. Dianah Field, a total of 3 L of normal saline was run, we also did a slow IV push of 25 mg of Phenergan as well as 4 mg of Zofran.  Impression and Recommendations:    # Nausea/Vomiting - Suspect symptoms represent an adverse reaction to venlafaxine, but cannot r/o  pancreatitis, acute hepatitis, or PUD at this time. - Discontinue venlafaxine - Will draw labs: CMP, LFTs, amylase, lipase, UA - Phenergan 25 mg IV now - Zofran 4 mg IV now - Will obtain CT abdomen and pelvis - Will prescribe phenergan 25 mg PO Q6H prn for home use  # Moderate Dehydration  - Pt with minimal PO intake over past 3 days, and orthostatic on exam - Pt to recieve 3 L NS bolus over 3 hours in office - Pt encouraged to continue PO hydration at home with electrolyte drinks   Follow up with PCP in 4 days  I spent 40 minutes with this patient, greater than 50% was face-to-face time counseling regarding the above diagnoses  I also spent an additional hour and a half above and beyond the time described above as a prolonged face-to-face encounter.

## 2014-06-26 NOTE — Assessment & Plan Note (Addendum)
Checking blood work, CT of the abdomen and pelvis considering degree of tenderness to palpation, certainly we have a concern for a surgical process in the abdomen.  IV fluid 3 L given in the office with concurrent IV pushes of Phenergan and Zofran. Patient felt significantly better after IV fluids, Phenergan, and Zofran. Oral Phenergan called in. Return to see PCP at the end of the week.

## 2014-06-26 NOTE — Telephone Encounter (Signed)
PA required for CT abdomen and pelvis with contrast - Approved - [id number]

## 2014-06-27 ENCOUNTER — Telehealth: Payer: Self-pay | Admitting: Physician Assistant

## 2014-06-27 LAB — CBC WITH DIFFERENTIAL/PLATELET
Basophils Absolute: 0 10*3/uL (ref 0.0–0.1)
Basophils Relative: 0 % (ref 0–1)
Eosinophils Absolute: 0.2 K/uL (ref 0.0–0.7)
Eosinophils Relative: 2 % (ref 0–5)
HCT: 39.8 % (ref 36.0–46.0)
Hemoglobin: 13.4 g/dL (ref 12.0–15.0)
Lymphocytes Relative: 32 % (ref 12–46)
Lymphs Abs: 3.2 10*3/uL (ref 0.7–4.0)
MCH: 30.5 pg (ref 26.0–34.0)
MCHC: 33.7 g/dL (ref 30.0–36.0)
MCV: 90.7 fL (ref 78.0–100.0)
MPV: 11.3 fL (ref 8.6–12.4)
Monocytes Absolute: 0.5 10*3/uL (ref 0.1–1.0)
Monocytes Relative: 5 % (ref 3–12)
Neutro Abs: 6.1 K/uL (ref 1.7–7.7)
Neutrophils Relative %: 61 % (ref 43–77)
Platelets: 199 10*3/uL (ref 150–400)
RBC: 4.39 MIL/uL (ref 3.87–5.11)
RDW: 13.5 % (ref 11.5–15.5)
WBC: 10 K/uL (ref 4.0–10.5)

## 2014-06-27 LAB — COMPREHENSIVE METABOLIC PANEL
ALT: 50 U/L — ABNORMAL HIGH (ref 0–35)
AST: 41 U/L — ABNORMAL HIGH (ref 0–37)
Albumin: 4 g/dL (ref 3.5–5.2)
Alkaline Phosphatase: 50 U/L (ref 39–117)
Potassium: 4.4 mEq/L (ref 3.5–5.3)
Sodium: 142 mEq/L (ref 135–145)
Total Protein: 6.8 g/dL (ref 6.0–8.3)

## 2014-06-27 LAB — URINALYSIS
Bilirubin Urine: NEGATIVE
Glucose, UA: NEGATIVE mg/dL
Hgb urine dipstick: NEGATIVE
Ketones, ur: NEGATIVE mg/dL
Leukocytes, UA: NEGATIVE
Nitrite: NEGATIVE
Protein, ur: NEGATIVE mg/dL
Specific Gravity, Urine: 1.007 (ref 1.005–1.030)
Urobilinogen, UA: 0.2 mg/dL (ref 0.0–1.0)
pH: 6 (ref 5.0–8.0)

## 2014-06-27 LAB — COMPREHENSIVE METABOLIC PANEL WITH GFR
BUN: 6 mg/dL (ref 6–23)
CO2: 25 meq/L (ref 19–32)
Calcium: 8.9 mg/dL (ref 8.4–10.5)
Chloride: 107 meq/L (ref 96–112)
Creat: 0.52 mg/dL (ref 0.50–1.10)
Glucose, Bld: 77 mg/dL (ref 70–99)
Total Bilirubin: 0.5 mg/dL (ref 0.2–1.2)

## 2014-06-27 LAB — LIPASE: Lipase: 43 U/L (ref 0–75)

## 2014-06-27 LAB — AMYLASE: Amylase: 31 U/L (ref 0–105)

## 2014-06-27 NOTE — Telephone Encounter (Signed)
Spoke to patient gave her results as noted below. Tylah Mancillas,CMA

## 2014-06-27 NOTE — Telephone Encounter (Signed)
Pt called. She wants to know the results from her CT. Thank you.

## 2014-06-30 ENCOUNTER — Encounter: Payer: Self-pay | Admitting: Physician Assistant

## 2014-06-30 ENCOUNTER — Ambulatory Visit (INDEPENDENT_AMBULATORY_CARE_PROVIDER_SITE_OTHER): Payer: 59 | Admitting: Physician Assistant

## 2014-06-30 VITALS — BP 127/80 | HR 78 | Wt 302.0 lb

## 2014-06-30 DIAGNOSIS — M797 Fibromyalgia: Secondary | ICD-10-CM

## 2014-06-30 DIAGNOSIS — F329 Major depressive disorder, single episode, unspecified: Secondary | ICD-10-CM | POA: Diagnosis not present

## 2014-06-30 DIAGNOSIS — F32A Depression, unspecified: Secondary | ICD-10-CM

## 2014-06-30 MED ORDER — HYDROCODONE-ACETAMINOPHEN 5-325 MG PO TABS: 1.0000 | ORAL_TABLET | Freq: Three times a day (TID) | ORAL | Status: DC | PRN

## 2014-06-30 MED ORDER — SERTRALINE HCL 50 MG PO TABS: ORAL_TABLET | ORAL | Status: DC

## 2014-06-30 MED ORDER — VORTIOXETINE HBR 10 MG PO TABS: 1.0000 | ORAL_TABLET | Freq: Every day | ORAL | Status: DC

## 2014-06-30 NOTE — Progress Notes (Signed)
   Subjective:    Patient ID: Kathryn Cline, female    DOB: [birth year], 39 y.o.   MRN: [id number]  HPI Pt presents to the clinic for follow up after Monday visit for dehydration and side effect of effexor. Doing much better now.   She is not on any anti-depressant. Denies any sucidial or homicidal  thoughts. It is a cost issue right now.    Review of Systems  All other systems reviewed and are negative.      Objective:   Physical Exam  Constitutional: She is oriented to person, place, and time. She appears well-developed and well-nourished.  HENT:  Head: Normocephalic and atraumatic.  Cardiovascular: Normal rate, regular rhythm and normal heart sounds.   Pulmonary/Chest: Effort normal and breath sounds normal.  Neurological: She is alert and oriented to person, place, and time.  Skin: Skin is dry.  Psychiatric: She has a normal mood and affect. Her behavior is normal.          Assessment & Plan:  Depression- PHQ-9 was 18. Struggling to find anything patient can afford. abilify doing-well insurance not pay for. Tried zyprexa too expensive. I certainly want to stay away for noroephinephrine receptor drugs pain seems to have some sort of negative side effects with those. Did well with zoloft for years but was not controlled and taken off. Will try brintellix with coupon card. 75m daily. If not able to afford rx given for zoloft again. I do think time to see psychiatrist. appt for march.   Chronic pain/fibromyalgia- continuing to work on pain management. Discussed with patient I do not like continuing narcotic 3 times a day in primary care clinic. PMardene Celesteis working it and waiting for response. Once I hear that at least pain clinic responded can decided to or not to continuing narcotics.

## 2014-06-30 NOTE — Patient Instructions (Signed)
March 15th, 8:30 [address].

## 2014-07-03 ENCOUNTER — Telehealth: Payer: Self-pay

## 2014-07-03 NOTE — Telephone Encounter (Signed)
Received Pa for Brintellix and sent through cover my meds waiting on auth - CF

## 2014-07-03 NOTE — Telephone Encounter (Signed)
Received fax from [employer] for [employer] and it has been approved until 07/04/2015 - CF

## 2014-07-04 ENCOUNTER — Other Ambulatory Visit: Payer: Self-pay | Admitting: Sports Medicine

## 2014-07-04 ENCOUNTER — Other Ambulatory Visit: Payer: Self-pay | Admitting: Physician Assistant

## 2014-07-06 ENCOUNTER — Telehealth: Payer: Self-pay | Admitting: Physician Assistant

## 2014-07-06 NOTE — Telephone Encounter (Signed)
error 

## 2014-07-06 NOTE — Telephone Encounter (Signed)
Kathryn Cline Per Kathryn Cline with [facility] office, Kathryn Cline has an appt today 3/3 @ 2:00 and she is aware of her appt.

## 2014-07-06 NOTE — Telephone Encounter (Signed)
Awesome, thank you 

## 2014-07-10 ENCOUNTER — Telehealth: Payer: Self-pay

## 2014-07-10 NOTE — Telephone Encounter (Signed)
Kathryn Cline left a message stating she is not able to take Brintelllix.

## 2014-07-10 NOTE — Telephone Encounter (Signed)
Allergic reaction? Intolerance.

## 2014-07-11 NOTE — Telephone Encounter (Signed)
brintellix added. There is no office visit or future appts indicating that pt was seen in [place]

## 2014-07-11 NOTE — Telephone Encounter (Signed)
Patient reports the Brintesllix caused vomiting. She did start the Zoloft as directed if the Brintellix did not help.

## 2014-07-11 NOTE — Telephone Encounter (Signed)
Will you please add to intolerance list? Did pt make it to psych visit downstairs yet?

## 2014-07-13 NOTE — Telephone Encounter (Signed)
I had made her appt in office for the next Tuesday after appt. Referral for pysch disappear. Can we call downstairs and find out if pt showed up?

## 2014-07-13 NOTE — Telephone Encounter (Signed)
I called [facility] and patient is scheduled for March 15 at 9 am. This is the first and only appointment patient has been scheduled for.

## 2014-07-14 NOTE — Telephone Encounter (Signed)
Spoke with patient and relayed Jade's message for continuation of Zoloft until patient's 07-18-14 appt.in [place]. Patient understands.

## 2014-07-14 NOTE — Telephone Encounter (Signed)
Ok thanks. She can continue on zoloft until she sees them on the 15th.

## 2014-07-18 ENCOUNTER — Ambulatory Visit (INDEPENDENT_AMBULATORY_CARE_PROVIDER_SITE_OTHER): Payer: 59 | Admitting: Physician Assistant

## 2014-07-18 ENCOUNTER — Encounter (HOSPITAL_COMMUNITY): Payer: Self-pay | Admitting: Physician Assistant

## 2014-07-18 VITALS — BP 129/94 | HR 81 | Ht 69.0 in | Wt 302.0 lb

## 2014-07-18 DIAGNOSIS — Z720 Tobacco use: Secondary | ICD-10-CM

## 2014-07-18 DIAGNOSIS — F331 Major depressive disorder, recurrent, moderate: Secondary | ICD-10-CM
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
## 2014-07-18 NOTE — Patient Instructions (Signed)
1. Continue Zoloft as discussed. 2. Add Vitamin D3 (5000) iu each day. 3. Add B complex 1 each day. 4. Avoid Flexeril unless needed. 5. Ask [name] about Reconditioning Physical therapy. 6. Ask [name] about Celebrex (generic) for joint pain. 7. Schedule with Francine Graven ASAP. 8. Discuss smoking cessation with Jade.

## 2014-07-19 ENCOUNTER — Ambulatory Visit (INDEPENDENT_AMBULATORY_CARE_PROVIDER_SITE_OTHER): Payer: 59 | Admitting: Physician Assistant

## 2014-07-19 ENCOUNTER — Encounter: Payer: Self-pay | Admitting: Physician Assistant

## 2014-07-19 VITALS — BP 142/87 | HR 99 | Temp 98.3°F | Ht 69.0 in | Wt 297.0 lb

## 2014-07-19 DIAGNOSIS — M797 Fibromyalgia: Secondary | ICD-10-CM

## 2014-07-19 DIAGNOSIS — J320 Chronic maxillary sinusitis: Secondary | ICD-10-CM | POA: Diagnosis not present

## 2014-07-19 DIAGNOSIS — M5440 Lumbago with sciatica, unspecified side: Secondary | ICD-10-CM

## 2014-07-19 DIAGNOSIS — G629 Polyneuropathy, unspecified: Secondary | ICD-10-CM | POA: Diagnosis not present

## 2014-07-19 DIAGNOSIS — M545 Low back pain, unspecified: Secondary | ICD-10-CM

## 2014-07-19 MED ORDER — AMOXICILLIN-POT CLAVULANATE 875-125 MG PO TABS: 1.0000 | ORAL_TABLET | Freq: Two times a day (BID) | ORAL | Status: DC

## 2014-07-19 MED ORDER — CELECOXIB 200 MG PO CAPS: 200.0000 mg | ORAL_CAPSULE | Freq: Two times a day (BID) | ORAL | Status: DC

## 2014-07-19 NOTE — Patient Instructions (Addendum)
augmentin for sinus infection.  Stop flexeril and dicolfenac.  Start celebrex.  Taper off norco.  Will get PT of left low back and leg.   Plantar Fasciitis (Heel Spur Syndrome) with Rehab The plantar fascia is a fibrous, ligament-like, soft-tissue structure that spans the bottom of the foot. Plantar fasciitis is a condition that causes pain in the foot due to inflammation of the tissue. SYMPTOMS   Pain and tenderness on the underneath side of the foot.  Pain that worsens with standing or walking. CAUSES  Plantar fasciitis is caused by irritation and injury to the plantar fascia on the underneath side of the foot. Common mechanisms of injury include:  Direct trauma to bottom of the foot.  Damage to a small nerve that runs under the foot where the main fascia attaches to the heel bone.  Stress placed on the plantar fascia due to bone spurs. RISK INCREASES WITH:   Activities that place stress on the plantar fascia (running, jumping, pivoting, or cutting).  Poor strength and flexibility.  Improperly fitted shoes.  Tight calf muscles.  Flat feet.  Failure to warm-up properly before activity.  Obesity. PREVENTION  Warm up and stretch properly before activity.  Allow for adequate recovery between workouts.  Maintain physical fitness:  Strength, flexibility, and endurance.  Cardiovascular fitness.  Maintain a health body weight.  Avoid stress on the plantar fascia.  Wear properly fitted shoes, including arch supports for individuals who have flat feet. PROGNOSIS  If treated properly, then the symptoms of plantar fasciitis usually resolve without surgery. However, occasionally surgery is necessary. RELATED COMPLICATIONS   Recurrent symptoms that may result in a chronic condition.  Problems of the lower back that are caused by compensating for the injury, such as limping.  Pain or weakness of the foot during push-off following surgery.  Chronic inflammation,  scarring, and partial or complete fascia tear, occurring more often from repeated injections. TREATMENT  Treatment initially involves the use of ice and medication to help reduce pain and inflammation. The use of strengthening and stretching exercises may help reduce pain with activity, especially stretches of the Achilles tendon. These exercises may be performed at home or with a therapist. Your caregiver may recommend that you use heel cups of arch supports to help reduce stress on the plantar fascia. Occasionally, corticosteroid injections are given to reduce inflammation. If symptoms persist for greater than 6 months despite non-surgical (conservative), then surgery may be recommended.  MEDICATION   If pain medication is necessary, then nonsteroidal anti-inflammatory medications, such as aspirin and ibuprofen, or other minor pain relievers, such as acetaminophen, are often recommended.  Do not take pain medication within 7 days before surgery.  Prescription pain relievers may be given if deemed necessary by your caregiver. Use only as directed and only as much as you need.  Corticosteroid injections may be given by your caregiver. These injections should be reserved for the most serious cases, because they may only be given a certain number of times. HEAT AND COLD  Cold treatment (icing) relieves pain and reduces inflammation. Cold treatment should be applied for 10 to 15 minutes every 2 to 3 hours for inflammation and pain and immediately after any activity that aggravates your symptoms. Use ice packs or massage the area with a piece of ice (ice massage).  Heat treatment may be used prior to performing the stretching and strengthening activities prescribed by your caregiver, physical therapist, or athletic trainer. Use a heat pack or soak the injury  in warm water. SEEK IMMEDIATE MEDICAL CARE IF:  Treatment seems to offer no benefit, or the condition worsens.  Any medications produce adverse  side effects. EXERCISES RANGE OF MOTION (ROM) AND STRETCHING EXERCISES - Plantar Fasciitis (Heel Spur Syndrome) These exercises may help you when beginning to rehabilitate your injury. Your symptoms may resolve with or without further involvement from your physician, physical therapist or athletic trainer. While completing these exercises, remember:   Restoring tissue flexibility helps normal motion to return to the joints. This allows healthier, less painful movement and activity.  An effective stretch should be held for at least 30 seconds.  A stretch should never be painful. You should only feel a gentle lengthening or release in the stretched tissue. RANGE OF MOTION - Toe Extension, Flexion  Sit with your right / left leg crossed over your opposite knee.  Grasp your toes and gently pull them back toward the top of your foot. You should feel a stretch on the bottom of your toes and/or foot.  Hold this stretch for __________ seconds.  Now, gently pull your toes toward the bottom of your foot. You should feel a stretch on the top of your toes and or foot.  Hold this stretch for __________ seconds. Repeat __________ times. Complete this stretch __________ times per day.  RANGE OF MOTION - Ankle Dorsiflexion, Active Assisted  Remove shoes and sit on a chair that is preferably not on a carpeted surface.  Place right / left foot under knee. Extend your opposite leg for support.  Keeping your heel down, slide your right / left foot back toward the chair until you feel a stretch at your ankle or calf. If you do not feel a stretch, slide your bottom forward to the edge of the chair, while still keeping your heel down.  Hold this stretch for __________ seconds. Repeat __________ times. Complete this stretch __________ times per day.  STRETCH - Gastroc, Standing  Place hands on wall.  Extend right / left leg, keeping the front knee somewhat bent.  Slightly point your toes inward on your  back foot.  Keeping your right / left heel on the floor and your knee straight, shift your weight toward the wall, not allowing your back to arch.  You should feel a gentle stretch in the right / left calf. Hold this position for __________ seconds. Repeat __________ times. Complete this stretch __________ times per day. STRETCH - Soleus, Standing  Place hands on wall.  Extend right / left leg, keeping the other knee somewhat bent.  Slightly point your toes inward on your back foot.  Keep your right / left heel on the floor, bend your back knee, and slightly shift your weight over the back leg so that you feel a gentle stretch deep in your back calf.  Hold this position for __________ seconds. Repeat __________ times. Complete this stretch __________ times per day. STRETCH - Gastrocsoleus, Standing  Note: This exercise can place a lot of stress on your foot and ankle. Please complete this exercise only if specifically instructed by your caregiver.   Place the ball of your right / left foot on a step, keeping your other foot firmly on the same step.  Hold on to the wall or a rail for balance.  Slowly lift your other foot, allowing your body weight to press your heel down over the edge of the step.  You should feel a stretch in your right / left calf.  Hold this  position for __________ seconds.  Repeat this exercise with a slight bend in your right / left knee. Repeat __________ times. Complete this stretch __________ times per day.  STRENGTHENING EXERCISES - Plantar Fasciitis (Heel Spur Syndrome)  These exercises may help you when beginning to rehabilitate your injury. They may resolve your symptoms with or without further involvement from your physician, physical therapist or athletic trainer. While completing these exercises, remember:   Muscles can gain both the endurance and the strength needed for everyday activities through controlled exercises.  Complete these exercises as  instructed by your physician, physical therapist or athletic trainer. Progress the resistance and repetitions only as guided. STRENGTH - Towel Curls  Sit in a chair positioned on a non-carpeted surface.  Place your foot on a towel, keeping your heel on the floor.  Pull the towel toward your heel by only curling your toes. Keep your heel on the floor.  If instructed by your physician, physical therapist or athletic trainer, add ____________________ at the end of the towel. Repeat __________ times. Complete this exercise __________ times per day. STRENGTH - Ankle Inversion  Secure one end of a rubber exercise band/tubing to a fixed object (table, pole). Loop the other end around your foot just before your toes.  Place your fists between your knees. This will focus your strengthening at your ankle.  Slowly, pull your big toe up and in, making sure the band/tubing is positioned to resist the entire motion.  Hold this position for __________ seconds.  Have your muscles resist the band/tubing as it slowly pulls your foot back to the starting position. Repeat __________ times. Complete this exercises __________ times per day.  Document Released: 04/21/2005 Document Revised: 07/14/2011 Document Reviewed: 08/03/2008 [facility] Patient Information 2015 [place], [place]. This information is not intended to replace advice given to you by your health care provider. Make sure you discuss any questions you have with your health care provider.

## 2014-07-19 NOTE — Progress Notes (Signed)
   Subjective:    Patient ID: Kathryn Cline, female    DOB: 1975/10/04, 39 y.o.   MRN: [id number]  HPI  Pt presents to the clinic to follow up after visit downstairs with pyschologist. She wants her to top flexeril, norco, and dicolfenac. She opts to continue zoloft.   She is seeing pain clinic and got 2 injections in her back last week. Low back pain still ongoing would like to consider PT. She did get membership at [employer] to start in near future.   She does wonder if there is anything to be done with the numbness, tingling and pain of bilateral feet. Worse with weight bearing. Tried and failed neurontin, lyrica, amitripyline and cymbalta. norco only at bedtime as needed.   Pt also has had productive cough, sinus pressure, ear pain, nasal congestion for over a week. mucinex tried but no relief. No fever, chills, nausea, or body aches.     Review of Systems  All other systems reviewed and are negative.      Objective:   Physical Exam  Constitutional: She is oriented to person, place, and time. She appears well-developed and well-nourished.  Bilateral TM"s erythematous.   Tenderness over maxillary and frontal sinuses.   Bilateral nasal turbinates are red and swollen.   HENT:  Head: Normocephalic and atraumatic.  Eyes: Conjunctivae are normal. Right eye exhibits no discharge. Left eye exhibits no discharge.  Neck: Normal range of motion. Neck supple.  Cardiovascular: Normal rate, regular rhythm and normal heart sounds.   Pulmonary/Chest: Effort normal and breath sounds normal. She has no wheezes.  Lymphadenopathy:    She has no cervical adenopathy.  Neurological: She is alert and oriented to person, place, and time.  Psychiatric: She has a normal mood and affect. Her behavior is normal.          Assessment & Plan:    Low back pain with left sided scaiatia/chronic pain seeing pain clinic. Taper off norco to very as needed. Goal only 1-3 times a month. Will order  PT. Stop diclofenac and flexeril. Started celebrex.   Depression- managed downstairs BH. Continue on zoloft.   Neuropathy of bilateral feet- i do think mostly neuropathy could be some inflammation of fascia as well. Pt has optimized therapy for neuropathy with intolerance. Wear supportive shoes. HO given with exercises. Perhaps celebrex will help some.   Sinusitis- augmentin for 10 days. HO given. Continue on zyrtec daily. Consider nasal saline rinses.

## 2014-08-05 ENCOUNTER — Other Ambulatory Visit: Payer: Self-pay | Admitting: Physician Assistant

## 2014-08-07 ENCOUNTER — Ambulatory Visit (INDEPENDENT_AMBULATORY_CARE_PROVIDER_SITE_OTHER): Payer: 59 | Admitting: Physical Therapy

## 2014-08-07 DIAGNOSIS — M545 Low back pain: Secondary | ICD-10-CM

## 2014-08-07 DIAGNOSIS — R29898 Other symptoms and signs involving the musculoskeletal system: Secondary | ICD-10-CM | POA: Diagnosis not present

## 2014-08-07 NOTE — Therapy (Signed)
[place], [place], [address] Phone: [phone]   Fax:  [phone]  Physical Therapy Evaluation  Patient Details  Name: Kathryn Cline MRN: [id number] Date of Birth: 1976/03/03 Referring Provider:  Donella Stade, PA-C  Encounter Date: 08/07/2014      PT End of Session - 08/07/14 1157    Visit Number 1   Number of Visits 16   Date for PT Re-Evaluation 10/06/14   PT Start Time 1120   PT Stop Time 1210   PT Time Calculation (min) 50 min   Activity Tolerance Patient tolerated treatment well   Behavior During Therapy [facility] for tasks assessed/performed      Past Medical History  Diagnosis Date  . Neuropathy   . Fibromyalgia   . GERD (gastroesophageal reflux disease)   . Allergy   . Hypertension     Past Surgical History  Procedure Laterality Date  . Appendectomy    . Abdominal hysterectomy    . Wrist surgery    . Left foot surgery    . Cyst removal trunk    . Cholecystectomy      There were no vitals filed for this visit.  Visit Diagnosis:  Midline low back pain, with sciatica presence unspecified - Plan: PT plan of care cert/re-cert  Weakness of both lower extremities - Plan: PT plan of care cert/re-cert      Subjective Assessment - 08/07/14 1120    Subjective Pt is a 39 y/o female who presents to OPPT with pain in back radiating to hips and legs x ~ 9 years.  Pt reports pain since 2007 with attempts to get disability since then.     Pertinent History HTN, asthma, fibromyalgia, neuropathy   Limitations House hold activities;Walking;Standing;Sitting   How long can you sit comfortably? 15 min   How long can you stand comfortably? 10 min   How long can you walk comfortably? 15 min   Diagnostic tests MRI: bulging disc, arthritis and bone detoriation   Patient Stated Goals improve function; improve mobility "get out more"   Currently in Pain? Yes   Pain Score 7    Pain Location  Back   Pain Orientation Mid;Lower;Left;Right  L>R   Pain Descriptors / Indicators Sharp;Constant   Pain Type Chronic pain   Pain Radiating Towards hips/legs (reports pain down ant and post of leg)   Pain Onset More than a month ago   Pain Frequency Constant   Aggravating Factors  walking, prolonged static positioning, housework   Pain Relieving Factors lying down, ice, TENS            [facility] PT Assessment - 08/07/14 1125    Assessment   Medical Diagnosis LBP   Onset Date --  approx 2007   Next MD Visit 08/18/14   Prior Therapy OPPT at this facility 1-2 years ago   Precautions   Precautions None   Restrictions   Weight Bearing Restrictions No   Balance Screen   Has the patient fallen in the past 6 months No   Has the patient had a decrease in activity level because of a fear of falling?  Yes   Is the patient reluctant to leave their home because of a fear of falling?  Yes   [place] Private residence   Living Arrangements Parent;Spouse/significant other;Children  parents, husband, adult daughter   Available Help at Discharge Available 24 hours/day;Family   Type of Home House  Home Access Stairs to enter   Entrance Stairs-Number of Steps 5   Entrance Stairs-Rails Right;Left   Home Layout One level   Prior Function   Level of Independence Independent with basic ADLs;Independent with gait;Independent with transfers   [place] Unemployed   Vocation Requirements trying to get disability; was working at [employer] in 2007   Leisure walking on treadmill at gym 2x/wk x 15 min;    Cognition   Overall Cognitive Status Within Functional Limits for tasks assessed   Observation/Other Assessments   Focus on Therapeutic Outcomes (FOTO)  29 (71% limited; predicted 51% limited)   AROM   AROM Assessment Site Lumbar   Lumbar Flexion 24  with pain   Lumbar Extension 20  with pain   Lumbar - Right Side Bend 14   Lumbar - Left Side Bend 13  pain worse  than R   Lumbar - Right Rotation limited  ~ 50%   Lumbar - Left Rotation limited ~ 50% with pain   Strength   Overall Strength Comments submaximal effort noted with hip flexion and knee flexion.  Pt able to perform with different cues.   Strength Assessment Site Hip;Knee;Ankle   Right Hip Flexion 4/5   Right Hip Extension 4/5   Right Hip ABduction 4/5   Right Hip ADduction 3/5   Left Hip Flexion 3/5   Left Hip Extension 3+/5   Left Hip ABduction 3+/5   Left Hip ADduction 3/5   Right Knee Flexion 4/5   Right Knee Extension 4/5   Left Knee Flexion 3+/5   Left Knee Extension 4/5   Right Ankle Dorsiflexion 4/5   Left Ankle Dorsiflexion 4/5   Flexibility   Soft Tissue Assessment /Muscle Length yes   Hamstrings tightness bil   Palpation   Palpation Tenderness with lumbar P/A glides; tenderness to palpation along bil lumbar paraspinals and SIJ   Special Tests    Special Tests Lumbar  SLR positive on L for pain in back   Ambulation/Gait   Ambulation/Gait Yes   Gait Pattern Step-through pattern;Decreased hip/knee flexion - right;Decreased hip/knee flexion - left;Lateral trunk lean to right;Lateral trunk lean to left;Wide base of support;Antalgic                   [facility] Adult PT Treatment/Exercise - 08/07/14 1125    Posture/Postural Control   Posture/Postural Control Postural limitations   Postural Limitations Rounded Shoulders;Forward head;Increased lumbar lordosis;Increased thoracic kyphosis   Modalities   Modalities Cryotherapy;Electrical Stimulation   Cryotherapy   Number Minutes Cryotherapy 15 Minutes   Cryotherapy Location Back   Type of Cryotherapy Ice pack   Electrical Stimulation   Electrical Stimulation Location low back   Electrical Stimulation Action IFC    Electrical Stimulation Parameters to tolerance    Electrical Stimulation Goals Pain                PT Education - 08/07/14 1157    Education provided Yes   Education Details clinical  findings and POC/goals of care   Person(s) Educated Patient   Methods Explanation   Comprehension Verbalized understanding          PT Short Term Goals - 08/07/14 1201    PT SHORT TERM GOAL #1   Title independent with HEP (09/04/14)   Time 4   Period Weeks   Status New   PT SHORT TERM GOAL #2   Title improve ROM by 5 degrees of lumbar spine for improved mobility (09/04/14)  Time 4   Period Weeks   Status New   PT SHORT TERM GOAL #3   Title report pain < 5/10 for improved function (09/04/14)   Time 4   Period Weeks   Status New   PT SHORT TERM GOAL #4   Title tolerate sitting/standing/walking at least 20 min each position without increase in pain for improved function (09/04/14)   Time 4   Period Weeks   Status New           PT Long Term Goals - 08/07/14 1204    PT LONG TERM GOAL #1   Title independent with advanced HEP (10/03/14)   Time 8   Period Weeks   Status New   PT LONG TERM GOAL #2   Title verbalize understanding of posture/body mechanics to reduce the risk of reinjury (10/03/14)   Time 4   Period Weeks   Status New   PT LONG TERM GOAL #3   Title lumbar ROM improved by 10 degrees all motions for improved function (10/03/14)   Time 8   Period Weeks   Status New   PT LONG TERM GOAL #4   Title tolerate walking > 30 min without increase in pain for improved function (10/03/14)   Time 8   Period Weeks   Status New   PT LONG TERM GOAL #5   Title report pain < 3/10 for improved pain (10/03/14)   Time 8   Period Weeks   Status New               Plan - 08/07/14 1159    Clinical Impression Statement Pt presents to OPPT with low back pain, lower extremity and core weakness.  Will benefit from PT to maximize function and reduce pain.     Pt will benefit from skilled therapeutic intervention in order to improve on the following deficits Decreased endurance;Decreased balance;Pain;Obesity;Decreased strength;Decreased mobility;Abnormal gait;Decreased range of  motion;Impaired flexibility;Improper body mechanics;Postural dysfunction;Difficulty walking   Rehab Potential Good   PT Frequency 2x / week   PT Duration 8 weeks   PT Treatment/Interventions ADLs/Self Care Home Management;Moist Heat;Therapeutic activities;Patient/family education;Passive range of motion;Therapeutic exercise;Traction;Ultrasound;Gait training;Balance training;Manual techniques;Neuromuscular re-education;Cryotherapy;Electrical Stimulation;Functional mobility training   PT Next Visit Plan continue modalities PRN; issue HEP for core strengthening and flexibility, ? traction   Consulted and Agree with Plan of Care Patient         Problem List Patient Active Problem List   Diagnosis Date Noted  . Dehydration 06/26/2014  . Left-sided low back pain with left-sided sciatica 06/19/2014  . Severe obesity (BMI >= 40) 11/17/2013  . Depression 09/21/2013  . Osteoarthritis of both hips 08/25/2013  . Hypertriglyceridemia 08/24/2013  . Foot fracture, right 08/22/2013  . Fibromyalgia 07/18/2013  . Neuropathy 07/18/2013  . Low back pain with radiation 07/18/2013  . Migraines 07/18/2013  . Atrophic vaginitis 02/26/2012   Laureen Abrahams, PT, DPT 08/07/2014 12:16 PM  [facility] [place], [place], [address] Phone: [phone]   Fax:  [phone]

## 2014-08-08 ENCOUNTER — Ambulatory Visit (INDEPENDENT_AMBULATORY_CARE_PROVIDER_SITE_OTHER): Payer: 59 | Admitting: Physician Assistant

## 2014-08-08 ENCOUNTER — Encounter: Payer: Self-pay | Admitting: Physician Assistant

## 2014-08-08 VITALS — BP 139/90 | HR 90 | Wt 288.0 lb

## 2014-08-08 DIAGNOSIS — R300 Dysuria: Secondary | ICD-10-CM

## 2014-08-08 DIAGNOSIS — N898 Other specified noninflammatory disorders of vagina: Secondary | ICD-10-CM | POA: Diagnosis not present

## 2014-08-08 DIAGNOSIS — L298 Other pruritus: Secondary | ICD-10-CM

## 2014-08-08 LAB — POCT URINALYSIS DIPSTICK
Bilirubin, UA: NEGATIVE
Glucose, UA: NEGATIVE
Ketones, UA: NEGATIVE
NITRITE UA: NEGATIVE
PROTEIN UA: NEGATIVE
RBC UA: NEGATIVE
Spec Grav, UA: 1.015
Urobilinogen, UA: 0.2
pH, UA: 5.5

## 2014-08-08 LAB — WET PREP FOR TRICH, YEAST, CLUE
Clue Cells Wet Prep HPF POC: NONE SEEN
Trich, Wet Prep: NONE SEEN
WBC, Wet Prep HPF POC: NONE SEEN
YEAST WET PREP: NONE SEEN

## 2014-08-08 MED ORDER — CELECOXIB 200 MG PO CAPS: 200.0000 mg | ORAL_CAPSULE | Freq: Two times a day (BID) | ORAL | Status: DC

## 2014-08-08 MED ORDER — CIPROFLOXACIN HCL 500 MG PO TABS: 500.0000 mg | ORAL_TABLET | Freq: Two times a day (BID) | ORAL | Status: DC

## 2014-08-08 NOTE — Progress Notes (Signed)
   Subjective:    Patient ID: Kathryn Cline, female    DOB: 1975/08/19, 39 y.o.   MRN: [id number]  HPI  Patient is a 39 yo female who presents to the clinic with dysuria that started this am. She has had some difficultly starting stream for last 2 days. No blood notice in urine. Just finished abx. Admits to having some vaginal itching and slight discharge. Not done anything to make better. Nothing makes worse. No fever, chills, abdominal pain or flank pain.   She needs refill on celebrex. Has noticed some improvement. Start PT yesterday. Still takes norco as needed at bedtime.     Review of Systems  All other systems reviewed and are negative.      Objective:   Physical Exam  Constitutional: She is oriented to person, place, and time. She appears well-developed and well-nourished.  HENT:  Head: Normocephalic and atraumatic.  Cardiovascular: Normal rate, regular rhythm and normal heart sounds.   Pulmonary/Chest: Effort normal and breath sounds normal.  No CVA tenderness.   Abdominal: Soft. Bowel sounds are normal. She exhibits no distension and no mass. There is no tenderness. There is no rebound and no guarding.  Neurological: She is alert and oriented to person, place, and time.  Skin: Skin is dry.  Psychiatric: She has a normal mood and affect. Her behavior is normal.          Assessment & Plan:  Dysuria/vaginal itching/discharge- ordered stat wet prep. .. Results for orders placed or performed in visit on 08/08/14  POCT urinalysis dipstick  Result Value Ref Range   Color, UA YELLOW    Clarity, UA CLEAR    Glucose, UA NEGATIVE    Bilirubin, UA NEGATIVE    Ketones, UA NEGATIVE    Spec Grav, UA 1.015    Blood, UA NEGATIVE    pH, UA 5.5    Protein, UA NEGATIVE    Urobilinogen, UA 0.2    Nitrite, UA NEGATIVE    Leukocytes, UA Trace    Will culture. No overt signs of infection. Printed cipro for 3 days for pt to hold until wet prep results back. Since symptomatic can  start if wet prep negative or culture comes back positive. Increase hydration. Follow up if symptoms persist.   Left sided low back pain with sciatica/fibromyaglia/osteoarthritis- brand only celebrex was sent to pharmacy today with 5 refills. Per pt states will be cheaper with card she has. Continue PT. Follow up with pain management regarding once daily at bedtime norco.

## 2014-08-08 NOTE — Patient Instructions (Signed)
Dysuria Dysuria is the medical term for pain with urination. There are many causes for dysuria, but urinary tract infection is the most common. If a urinalysis was performed it can show that there is a urinary tract infection. A urine culture confirms that you or your child is sick. You will need to follow up with a healthcare provider because:  If a urine culture was done you will need to know the culture results and treatment recommendations.  If the urine culture was positive, you or your child will need to be put on antibiotics or know if the antibiotics prescribed are the right antibiotics for your urinary tract infection.  If the urine culture is negative (no urinary tract infection), then other causes may need to be explored or antibiotics need to be stopped. Today laboratory work may have been done and there does not seem to be an infection. If cultures were done they will take at least 24 to 48 hours to be completed. Today x-rays may have been taken and they read as normal. No cause can be found for the problems. The x-rays may be re-read by a radiologist and you will be contacted if additional findings are made. You or your child may have been put on medications to help with this problem until you can see your primary caregiver. If the problems get better, see your primary caregiver if the problems return. If you were given antibiotics (medications which kill germs), take all of the mediations as directed for the full course of treatment.  If laboratory work was done, you need to find the results. Leave a telephone number where you can be reached. If this is not possible, make sure you find out how you are to get test results. HOME CARE INSTRUCTIONS   Drink lots of fluids. For adults, drink eight, 8 ounce glasses of clear juice or water a day. For children, replace fluids as suggested by your caregiver.  Empty the bladder often. Avoid holding urine for long periods of time.  After a bowel  movement, women should cleanse front to back, using each tissue only once.  Empty your bladder before and after sexual intercourse.  Take all the medicine given to you until it is gone. You may feel better in a few days, but TAKE ALL MEDICINE.  Avoid caffeine, tea, alcohol and carbonated beverages, because they tend to irritate the bladder.  In men, alcohol may irritate the prostate.  Only take over-the-counter or prescription medicines for pain, discomfort, or fever as directed by your caregiver.  If your caregiver has given you a follow-up appointment, it is very important to keep that appointment. Not keeping the appointment could result in a chronic or permanent injury, pain, and disability. If there is any problem keeping the appointment, you must call back to this facility for assistance. SEEK IMMEDIATE MEDICAL CARE IF:   Back pain develops.  A fever develops.  There is nausea (feeling sick to your stomach) or vomiting (throwing up).  Problems are no better with medications or are getting worse. MAKE SURE YOU:   Understand these instructions.  Will watch your condition.  Will get help right away if you are not doing well or get worse. Document Released: 01/18/2004 Document Revised: 07/14/2011 Document Reviewed: 11/25/2007 [facility] Patient Information 2015 [place], [place]. This information is not intended to replace advice given to you by your health care provider. Make sure you discuss any questions you have with your health care provider.

## 2014-08-09 ENCOUNTER — Ambulatory Visit (HOSPITAL_COMMUNITY): Payer: Self-pay | Admitting: Physician Assistant

## 2014-08-09 ENCOUNTER — Telehealth (HOSPITAL_COMMUNITY): Payer: Self-pay | Admitting: *Deleted

## 2014-08-09 DIAGNOSIS — F331 Major depressive disorder, recurrent, moderate: Secondary | ICD-10-CM

## 2014-08-09 MED ORDER — SERTRALINE HCL 50 MG PO TABS
ORAL_TABLET | ORAL | Status: DC
Start: 2014-08-09 — End: 2014-09-15

## 2014-08-09 MED ORDER — SERTRALINE HCL 50 MG PO TABS: ORAL_TABLET | ORAL | Status: DC

## 2014-08-09 NOTE — Telephone Encounter (Addendum)
Pt called for a refill for Zoloft 90m. Per NNena Polio PA-C, pt is authorized for a refill for Zoloft 554m Qty 30. Prescription was sent to pharmacy. [facility] received prescription for Zoloft 5066mCalled and informed pt of prescription status. Pt states and shows understanding.

## 2014-08-10 ENCOUNTER — Ambulatory Visit (INDEPENDENT_AMBULATORY_CARE_PROVIDER_SITE_OTHER): Payer: 59 | Admitting: Physical Therapy

## 2014-08-10 DIAGNOSIS — R29898 Other symptoms and signs involving the musculoskeletal system: Secondary | ICD-10-CM

## 2014-08-10 DIAGNOSIS — M545 Low back pain: Secondary | ICD-10-CM | POA: Diagnosis not present

## 2014-08-10 LAB — URINE CULTURE: Colony Count: 50000

## 2014-08-10 NOTE — Therapy (Signed)
[place], [place], [address] Phone: [phone]   Fax:  [phone]  Physical Therapy Treatment  Patient Details  Name: Kathryn Cline MRN: [id number] Date of Birth: 1976/01/29 Referring Provider:  Donella Stade, PA-C  Encounter Date: 08/10/2014      PT End of Session - 08/10/14 1135    Visit Number 2   Number of Visits 16   Date for PT Re-Evaluation 10/06/14   PT Start Time 1100   PT Stop Time 1146   PT Time Calculation (min) 46 min   Activity Tolerance Patient tolerated treatment well;No increased pain   Behavior During Therapy [facility] for tasks assessed/performed      Past Medical History  Diagnosis Date  . Neuropathy   . Fibromyalgia   . GERD (gastroesophageal reflux disease)   . Allergy   . Hypertension     Past Surgical History  Procedure Laterality Date  . Appendectomy    . Abdominal hysterectomy    . Wrist surgery    . Left foot surgery    . Cyst removal trunk    . Cholecystectomy      There were no vitals filed for this visit.  Visit Diagnosis:  Midline low back pain, with sciatica presence unspecified  Weakness of both lower extremities      Subjective Assessment - 08/10/14 1104    Subjective back is hurting a lot today, was up until 3:30 last night due to pain   Currently in Pain? Yes   Pain Score 8    Pain Location Back   Pain Orientation Right   Pain Descriptors / Indicators Constant;Sharp   Pain Radiating Towards hip and L leg   Pain Onset More than a month ago                       [facility] Adult PT Treatment/Exercise - 08/10/14 1107    Lumbar Exercises: Stretches   Passive Hamstring Stretch 2 reps;30 seconds   Passive Hamstring Stretch Limitations bil with strap   Single Knee to Chest Stretch 2 reps;30 seconds   Lower Trunk Rotation 3 reps;30 seconds   Lower Trunk Rotation Limitations bil   Lumbar Exercises: Aerobic   Stationary Bike  NuStep Level 3 x 8 min   Lumbar Exercises: Supine   Ab Set 10 reps;5 seconds   Clam 10 reps   Clam Limitations alt; cues for TA contraction   Bridge 10 reps;5 seconds   Modalities   Modalities Cryotherapy;Electrical Stimulation   Cryotherapy   Number Minutes Cryotherapy 15 Minutes   Cryotherapy Location Back   Type of Cryotherapy Ice pack   Electrical Stimulation   Electrical Stimulation Location low back   Electrical Stimulation Action IFC   Electrical Stimulation Parameters to tolerance x 15 min   Electrical Stimulation Goals Pain                PT Education - 08/10/14 1135    Education provided Yes   Education Details HEP   Person(s) Educated Patient   Methods Explanation;Demonstration;Handout   Comprehension Verbalized understanding;Returned demonstration;Need further instruction          PT Short Term Goals - 08/10/14 1135    PT SHORT TERM GOAL #1   Title independent with HEP (09/04/14)   Status On-going   PT SHORT TERM GOAL #2   Title improve ROM by 5 degrees of lumbar spine for improved mobility (09/04/14)   Status  On-going   PT SHORT TERM GOAL #3   Title report pain < 5/10 for improved function (09/04/14)   Status On-going   PT SHORT TERM GOAL #4   Title tolerate sitting/standing/walking at least 20 min each position without increase in pain for improved function (09/04/14)   Status On-going           PT Long Term Goals - 08/10/14 1136    PT LONG TERM GOAL #1   Title independent with advanced HEP (10/03/14)   Status On-going   PT LONG TERM GOAL #2   Title verbalize understanding of posture/body mechanics to reduce the risk of reinjury (10/03/14)   Status On-going   PT LONG TERM GOAL #3   Title lumbar ROM improved by 10 degrees all motions for improved function (10/03/14)   Status On-going   PT LONG TERM GOAL #4   Title tolerate walking > 30 min without increase in pain for improved function (10/03/14)   Status On-going               Plan -  08/10/14 1135    PT Next Visit Plan review HEP, ? traction, continue core and hip strengthening   Consulted and Agree with Plan of Care Patient        Problem List Patient Active Problem List   Diagnosis Date Noted  . Dehydration 06/26/2014  . Left-sided low back pain with left-sided sciatica 06/19/2014  . Severe obesity (BMI >= 40) 11/17/2013  . Depression 09/21/2013  . Osteoarthritis of both hips 08/25/2013  . Hypertriglyceridemia 08/24/2013  . Foot fracture, right 08/22/2013  . Fibromyalgia 07/18/2013  . Neuropathy 07/18/2013  . Low back pain with radiation 07/18/2013  . Migraines 07/18/2013  . Atrophic vaginitis 02/26/2012   Laureen Abrahams, PT, DPT 08/10/2014 11:49 AM  [facility] [place], [place], [address] Phone: [phone]   Fax:  [phone]

## 2014-08-10 NOTE — Patient Instructions (Signed)
  Knee to Chest (Flexion)   Pull knee toward chest. Feel stretch in lower back or buttock area. Breathing deeply, Hold __30__ seconds. Repeat with other knee. Repeat _2-3___ times. Do __2-3__ sessions per day.  http://gt2.exer.us/226   Copyright  VHI. All rights reserved.    Lower Trunk Rotation Stretch   Keeping back flat and feet together, rotate knees to left side. Hold ___30_ seconds. Repeat __3_ times per set. . Do __2__ sessions per day.  http://orth.exer.us/122   Copyright  VHI. All rights reserved.    Hamstring Step 2   Left foot relaxed, knee straight, other leg bent, foot flat. Raise straight leg further upward to maximal range. Hold _30__ seconds. Relax leg completely down. Repeat _2-3__ times.  Copyright  VHI. All rights reserved.   Pelvic Tilt (Flexion)   With feet flat and knees bent, flatten lower back into bed. Tighten stomach muscles. Hold _5___ seconds. Repeat _10___ times. Do _2-3___ sessions per day.  http://gt2.exer.us/230   Copyright  VHI. All rights reserved.   Knee Drop   Keep pelvis stable. Without rotating hips, slowly drop knee to side, pause, return to center, bring knee across midline toward opposite hip. Feel obliques engaging. Repeat for ___10_ times each leg.  Perform 2-3 sessions per day.  Copyright  VHI. All rights reserved.     Laureen Abrahams, PT, DPT 08/10/2014 11:35 AM  [facility] [place], [place] [address]  [phone] (office) [phone] (fax)

## 2014-08-15 ENCOUNTER — Encounter: Payer: Self-pay | Admitting: Physical Therapy

## 2014-08-15 ENCOUNTER — Ambulatory Visit (INDEPENDENT_AMBULATORY_CARE_PROVIDER_SITE_OTHER): Payer: 59 | Admitting: Physical Therapy

## 2014-08-15 DIAGNOSIS — R29898 Other symptoms and signs involving the musculoskeletal system: Secondary | ICD-10-CM | POA: Diagnosis not present

## 2014-08-15 DIAGNOSIS — M545 Low back pain: Secondary | ICD-10-CM | POA: Diagnosis not present

## 2014-08-15 NOTE — Therapy (Signed)
[place], [place], [address] Phone: [phone]   Fax:  [phone]  Physical Therapy Treatment  Patient Details  Name: Kathryn Cline MRN: [id number] Date of Birth: [birth year] Referring Provider:  Donella Stade, PA-C  Encounter Date: 08/15/2014      PT End of Session - 08/15/14 1115    Visit Number 3   Number of Visits 16   Date for PT Re-Evaluation 10/06/14   PT Start Time 1101   PT Stop Time 9371   PT Time Calculation (min) 51 min      Past Medical History  Diagnosis Date  . Neuropathy   . Fibromyalgia   . GERD (gastroesophageal reflux disease)   . Allergy   . Hypertension     Past Surgical History  Procedure Laterality Date  . Appendectomy    . Abdominal hysterectomy    . Wrist surgery    . Left foot surgery    . Cyst removal trunk    . Cholecystectomy      There were no vitals filed for this visit.  Visit Diagnosis:  Midline low back pain, with sciatica presence unspecified  Weakness of both lower extremities      Subjective Assessment - 08/15/14 1106    Subjective The pain is really bad today, all the way down the Lt leg. The weather isn't helping.  She is attempting to perform the HEP without relief.  Sees pain MD on Thursday, they are trying wean her off meds   Currently in Pain? Yes   Pain Score 9    Pain Location Back   Pain Orientation Lower   Pain Descriptors / Indicators Aching;Shooting;Sharp;Jabbing   Pain Type Chronic pain   Pain Radiating Towards into Lt LE to foot   Pain Onset More than a month ago   Pain Frequency Constant   Aggravating Factors  walking, standing, everthing   Pain Relieving Factors TENS and ice while she is on them.                        [facility] Adult PT Treatment/Exercise - 08/15/14 0001    Lumbar Exercises: Prone   Other Prone Lumbar Exercises POE without relief.    Other Prone Lumbar Exercises TA contractions  with 5 sec holds x10   Modalities   Modalities Traction;Electrical Stimulation;Cryotherapy   Cryotherapy   Number Minutes Cryotherapy 15 Minutes   Cryotherapy Location Back   Type of Cryotherapy Ice pack   Electrical Stimulation   Electrical Stimulation Location low back   Electrical Stimulation Action IFC   Electrical Stimulation Parameters to tolerance   Electrical Stimulation Goals Pain   Traction   Type of Traction Lumbar   Min (lbs) 25   Max (lbs) 50   Hold Time 60   Rest Time 20   Time 15   Manual Therapy   Manual Therapy Joint mobilization  pt hypersensitive to palpation in the Lt gluts/piriformis    Joint Mobilization unable to tolerate grade I or II mobs due to pain in the lumbar spine.                   PT Short Term Goals - 08/15/14 1116    PT SHORT TERM GOAL #1   Title independent with HEP (09/04/14)   Status On-going   PT SHORT TERM GOAL #2   Title improve ROM by 5 degrees of lumbar spine for improved mobility (  09/04/14)   Status On-going   PT SHORT TERM GOAL #3   Title report pain < 5/10 for improved function (09/04/14)   Status On-going   PT SHORT TERM GOAL #4   Title tolerate sitting/standing/walking at least 20 min each position without increase in pain for improved function (09/04/14)   Status On-going           PT Long Term Goals - 08/15/14 1116    PT LONG TERM GOAL #1   Title independent with advanced HEP (10/03/14)   Status On-going   PT LONG TERM GOAL #2   Title verbalize understanding of posture/body mechanics to reduce the risk of reinjury (10/03/14)   Status On-going   PT LONG TERM GOAL #3   Title lumbar ROM improved by 10 degrees all motions for improved function (10/03/14)   Status On-going   PT LONG TERM GOAL #4   Title tolerate walking > 30 min without increase in pain for improved function (10/03/14)   Status On-going   PT LONG TERM GOAL #5   Title report pain < 3/10 for improved pain (10/03/14)   Status On-going                Plan - 08/15/14 1118    Clinical Impression Statement Pt had relief of symptoms with lumbar traction in prone.  No goals met, she presented in a lot of pain with gait deviations.    Pt will benefit from skilled therapeutic intervention in order to improve on the following deficits Decreased endurance;Decreased balance;Pain;Obesity;Decreased strength;Decreased mobility;Abnormal gait;Decreased range of motion;Impaired flexibility;Improper body mechanics;Postural dysfunction;Difficulty walking   Rehab Potential Good   PT Frequency 2x / week   PT Duration 8 weeks   PT Treatment/Interventions Cryotherapy;Electrical Stimulation;Manual techniques;Traction;Patient/family education   PT Next Visit Plan progress traction pull    Consulted and Agree with Plan of Care Patient        Problem List Patient Active Problem List   Diagnosis Date Noted  . Dehydration 06/26/2014  . Left-sided low back pain with left-sided sciatica 06/19/2014  . Severe obesity (BMI >= 40) 11/17/2013  . Depression 09/21/2013  . Osteoarthritis of both hips 08/25/2013  . Hypertriglyceridemia 08/24/2013  . Foot fracture, right 08/22/2013  . Fibromyalgia 07/18/2013  . Neuropathy 07/18/2013  . Low back pain with radiation 07/18/2013  . Migraines 07/18/2013  . Atrophic vaginitis 02/26/2012    Jeral Pinch PT 08/15/2014, 11:38 AM  [facility] [place], [place], [address] Phone: [phone]   Fax:  [phone]

## 2014-08-17 ENCOUNTER — Telehealth: Payer: Self-pay | Admitting: *Deleted

## 2014-08-17 NOTE — Telephone Encounter (Signed)
[facility] [place] for urology referral.

## 2014-08-17 NOTE — Telephone Encounter (Signed)
Spoke with patient & gave results. She stated that she is still hurting  & wonders if she could see someone about her bladder?

## 2014-08-18 ENCOUNTER — Telehealth: Payer: Self-pay | Admitting: *Deleted

## 2014-08-18 ENCOUNTER — Ambulatory Visit (INDEPENDENT_AMBULATORY_CARE_PROVIDER_SITE_OTHER): Payer: 59 | Admitting: Physical Therapy

## 2014-08-18 DIAGNOSIS — R29898 Other symptoms and signs involving the musculoskeletal system: Secondary | ICD-10-CM

## 2014-08-18 DIAGNOSIS — M545 Low back pain: Secondary | ICD-10-CM

## 2014-08-18 DIAGNOSIS — R300 Dysuria: Secondary | ICD-10-CM

## 2014-08-18 NOTE — Therapy (Signed)
[place], [place], [address] Phone: [phone]   Fax:  [phone]  Physical Therapy Treatment  Patient Details  Name: Kathryn Cline MRN: [id number] Date of Birth: December 16, 1975 Referring Provider:  Donella Stade, PA-C  Encounter Date: 08/18/2014      PT End of Session - 08/18/14 1151    Visit Number 4   Number of Visits 16   Date for PT Re-Evaluation 10/06/14   PT Start Time 1104   PT Stop Time 1151   PT Time Calculation (min) 47 min   Activity Tolerance Patient tolerated treatment well;No increased pain   Behavior During Therapy [facility] for tasks assessed/performed      Past Medical History  Diagnosis Date  . Neuropathy   . Fibromyalgia   . GERD (gastroesophageal reflux disease)   . Allergy   . Hypertension     Past Surgical History  Procedure Laterality Date  . Appendectomy    . Abdominal hysterectomy    . Wrist surgery    . Left foot surgery    . Cyst removal trunk    . Cholecystectomy      There were no vitals filed for this visit.  Visit Diagnosis:  Midline low back pain, with sciatica presence unspecified  Weakness of both lower extremities      Subjective Assessment - 08/18/14 1131    Subjective Pain today in lowback and down left leg.   Currently in Pain? Yes   Pain Score 8    Pain Location Back   Pain Orientation Lower   Pain Descriptors / Indicators Aching;Shooting;Sharp;Jabbing   Pain Type Chronic pain   Pain Radiating Towards into LLE to foot   Pain Frequency Constant                       [facility] Adult PT Treatment/Exercise - 08/18/14 0001    Exercises   Exercises Knee/Hip   Lumbar Exercises: Prone   Other Prone Lumbar Exercises POE x 3 min decreased pain to 4/10 in leg   Other Prone Lumbar Exercises Press ups x 10 decreased leg pain to 3/10   Knee/Hip Exercises: Standing   Wall Squat 2 sets;10 reps   Modalities   Modalities Traction   traction abolished leg pain; LBP 6/10   Traction   Type of Traction Lumbar   Min (lbs) 55   Max (lbs) 30   Hold Time 60   Rest Time 20   Time 15                PT Education - 08/18/14 1134    Education provided Yes   Education Details ADL modification and explanation of discs and why to avoid flexion.   Person(s) Educated Patient   Methods Explanation;Demonstration;Handout   Comprehension Verbalized understanding;Returned demonstration          PT Short Term Goals - 08/15/14 1116    PT SHORT TERM GOAL #1   Title independent with HEP (09/04/14)   Status On-going   PT SHORT TERM GOAL #2   Title improve ROM by 5 degrees of lumbar spine for improved mobility (09/04/14)   Status On-going   PT SHORT TERM GOAL #3   Title report pain < 5/10 for improved function (09/04/14)   Status On-going   PT SHORT TERM GOAL #4   Title tolerate sitting/standing/walking at least 20 min each position without increase in pain for improved function (09/04/14)   Status  On-going           PT Long Term Goals - 08/15/14 1116    PT LONG TERM GOAL #1   Title independent with advanced HEP (10/03/14)   Status On-going   PT LONG TERM GOAL #2   Title verbalize understanding of posture/body mechanics to reduce the risk of reinjury (10/03/14)   Status On-going   PT LONG TERM GOAL #3   Title lumbar ROM improved by 10 degrees all motions for improved function (10/03/14)   Status On-going   PT LONG TERM GOAL #4   Title tolerate walking > 30 min without increase in pain for improved function (10/03/14)   Status On-going   PT LONG TERM GOAL #5   Title report pain < 3/10 for improved pain (10/03/14)   Status On-going               Plan - 08/18/14 1159    Clinical Impression Statement Patient had relief of LE sx with traction and was able to decrease leg pain prior to traction with POE and press ups. Patient is unable to get off the floor if she kneels down, so will benefit from LE functional  strengtheningl.   PT Next Visit Plan progress traction, extension protocol and LE strengthening   Consulted and Agree with Plan of Care Patient        Problem List Patient Active Problem List   Diagnosis Date Noted  . Dehydration 06/26/2014  . Left-sided low back pain with left-sided sciatica 06/19/2014  . Severe obesity (BMI >= 40) 11/17/2013  . Depression 09/21/2013  . Osteoarthritis of both hips 08/25/2013  . Hypertriglyceridemia 08/24/2013  . Foot fracture, right 08/22/2013  . Fibromyalgia 07/18/2013  . Neuropathy 07/18/2013  . Low back pain with radiation 07/18/2013  . Migraines 07/18/2013  . Atrophic vaginitis 02/26/2012    Madelyn Flavors PT  08/18/2014, 12:03 PM  [facility] [place], [place], [address] Phone: [phone]   Fax:  [phone]

## 2014-08-18 NOTE — Patient Instructions (Addendum)
ADL modifications for low back.  Back Wall Slide   With feet 10____ inches from wall, lean as much of back against the wall as possible. Gently squat down, keeping back against wall. Hold _5___ seconds while counting out loud. Repeat 10-20____ times. Do _2___ sessions per day.  http://gt2.exer.us/564   Copyright  VHI. All rights reserved.  On Elbows (Prone)   Rise up on elbows as high as possible, keeping hips on floor. Hold __5__ minutes. Do every 2 hours.   http://orth.exer.us/93   Copyright  VHI. All rights reserved.  Madelyn Flavors, PT 08/18/2014 11:42 AM  [facility] [place], [place] [address]  [phone] (office) [phone] (fax)

## 2014-08-18 NOTE — Telephone Encounter (Signed)
Urology referral placed

## 2014-08-22 ENCOUNTER — Ambulatory Visit (INDEPENDENT_AMBULATORY_CARE_PROVIDER_SITE_OTHER): Payer: 59 | Admitting: Physical Therapy

## 2014-08-22 ENCOUNTER — Encounter: Payer: Self-pay | Admitting: Physical Therapy

## 2014-08-22 DIAGNOSIS — M545 Low back pain: Secondary | ICD-10-CM | POA: Diagnosis not present

## 2014-08-22 DIAGNOSIS — R29898 Other symptoms and signs involving the musculoskeletal system: Secondary | ICD-10-CM | POA: Diagnosis not present

## 2014-08-23 ENCOUNTER — Encounter: Payer: Self-pay | Admitting: Physician Assistant

## 2014-08-23 DIAGNOSIS — M545 Low back pain: Secondary | ICD-10-CM

## 2014-08-23 DIAGNOSIS — G8929 Other chronic pain: Secondary | ICD-10-CM | POA: Insufficient documentation

## 2014-08-23 NOTE — Therapy (Signed)
[place], [place], [address] Phone: [phone]   Fax:  [phone]  Physical Therapy Treatment  Patient Details  Name: Kathryn Cline MRN: [id number] Date of Birth: 02-25-1976 Referring Provider:  Donella Stade, PA-C  Encounter Date: 08/22/2014      PT End of Session - 08/22/14 1121    Visit Number 5   Number of Visits 16   Date for PT Re-Evaluation 10/06/14   PT Start Time 1117   PT Stop Time 1207   PT Time Calculation (min) 50 min   Activity Tolerance Patient limited by pain      Past Medical History  Diagnosis Date  . Neuropathy   . Fibromyalgia   . GERD (gastroesophageal reflux disease)   . Allergy   . Hypertension     Past Surgical History  Procedure Laterality Date  . Appendectomy    . Abdominal hysterectomy    . Wrist surgery    . Left foot surgery    . Cyst removal trunk    . Cholecystectomy      There were no vitals filed for this visit.  Visit Diagnosis:  Midline low back pain, with sciatica presence unspecified  Weakness of both lower extremities      Subjective Assessment - 08/22/14 1118    Subjective Pt goes to see an urologist on the 9th as her MD thinks her bladder may have fallen.    Pain Score 7    Pain Location Back   Pain Orientation Lower;Left   Pain Descriptors / Indicators Shooting;Aching   Pain Type Chronic pain   Pain Radiating Towards to Lt hip only, not in the foot anymore   Pain Onset More than a month ago   Pain Frequency Constant   Aggravating Factors  moving around   Pain Relieving Factors lying on her stomach, prone press ups. Ice and TENS               [facility] Adult PT Treatment/Exercise - 08/23/14 0001    Lumbar Exercises: Aerobic   Stationary Bike NuStep Level 3 x 8 min   Lumbar Exercises: Supine   Ab Set 15 reps  then PPT with knees in/out   Bridge 20 reps   Lumbar Exercises: Prone   Straight Leg Raise 20 reps   Opposite Arm/Leg Raise Right arm/Left leg;Left arm/Right leg;10 reps  very hard for pt.    Other Prone Lumbar Exercises pelvic press x10 with 5 sec holds   Traction   Type of Traction Lumbar   Min (lbs) 35   Max (lbs) 70   Hold Time 60   Rest Time 20   Time 20   Manual Therapy   Manual Therapy Myofascial release  TPR to Lt gluts and piriformis                              PT Short Term Goals - 08/22/14 1128    PT SHORT TERM GOAL #1   Title independent with HEP (09/04/14)   Status Achieved   PT SHORT TERM GOAL #2   Title improve ROM by 5 degrees of lumbar spine for improved mobility (09/04/14)   Status On-going   PT SHORT TERM GOAL #3   Title report pain < 5/10 for improved function (09/04/14)   Status On-going   PT SHORT TERM GOAL #4   Title tolerate sitting/standing/walking at least 20 min each  position without increase in pain for improved function (09/04/14)  at 15 minutes today   Status On-going           PT Long Term Goals - 08/22/14 1135    PT LONG TERM GOAL #1   Title independent with advanced HEP (10/03/14)   Status On-going   PT LONG TERM GOAL #2   Title verbalize understanding of posture/body mechanics to reduce the risk of reinjury (10/03/14)   Status On-going   PT LONG TERM GOAL #3   Title lumbar ROM improved by 10 degrees all motions for improved function (10/03/14)   Status On-going   PT LONG TERM GOAL #4   Title tolerate walking > 30 min without increase in pain for improved function (10/03/14)   Status On-going   PT LONG TERM GOAL #5   Title report pain < 3/10 for improved pain (10/03/14)   Status On-going               Plan - 08/22/14 1148    Clinical Impression Statement Pt continues to have good relief with the use of traction.  She is very weak in her core and hips.  If pt does have a prolasped bladder this could be contributing to back pain.     Pt will benefit from skilled therapeutic intervention in order to improve on  the following deficits Decreased endurance;Decreased balance;Pain;Obesity;Decreased strength;Decreased mobility;Abnormal gait;Decreased range of motion;Impaired flexibility;Improper body mechanics;Postural dysfunction;Difficulty walking   Rehab Potential Good   PT Frequency 2x / week   PT Duration 8 weeks   PT Treatment/Interventions Patient/family education;Therapeutic exercise;Traction   PT Next Visit Plan progress HEP to add extension based stability ther ex   Consulted and Agree with Plan of Care Patient        Problem List Patient Active Problem List   Diagnosis Date Noted  . Dehydration 06/26/2014  . Left-sided low back pain with left-sided sciatica 06/19/2014  . Severe obesity (BMI >= 40) 11/17/2013  . Depression 09/21/2013  . Osteoarthritis of both hips 08/25/2013  . Hypertriglyceridemia 08/24/2013  . Foot fracture, right 08/22/2013  . Fibromyalgia 07/18/2013  . Neuropathy 07/18/2013  . Low back pain with radiation 07/18/2013  . Migraines 07/18/2013  . Atrophic vaginitis 02/26/2012    SHAVER,SUE 08/23/2014, 11:30 AM  [facility] [place], [place], [address] Phone: [phone]   Fax:  [phone]

## 2014-08-23 NOTE — Therapy (Deleted)
[place], [place], [address] Phone: [phone]   Fax:  [phone]  Physical Therapy Treatment  Patient Details  Name: Kathryn Cline MRN: [id number] Date of Birth: 09-Nov-1975 Referring Provider:  Donella Stade, PA-C  Encounter Date: 08/22/2014      PT End of Session - 08/22/14 1121    Visit Number 5   Number of Visits 16   Date for PT Re-Evaluation 10/06/14   PT Start Time 1117   PT Stop Time 1207   PT Time Calculation (min) 50 min   Activity Tolerance Patient limited by pain      Past Medical History  Diagnosis Date  . Neuropathy   . Fibromyalgia   . GERD (gastroesophageal reflux disease)   . Allergy   . Hypertension     Past Surgical History  Procedure Laterality Date  . Appendectomy    . Abdominal hysterectomy    . Wrist surgery    . Left foot surgery    . Cyst removal trunk    . Cholecystectomy      There were no vitals filed for this visit.  Visit Diagnosis:  Midline low back pain, with sciatica presence unspecified  Weakness of both lower extremities      Subjective Assessment - 08/22/14 1118    Subjective Pt goes to see an urologist on the 9th as her MD thinks her bladder may have fallen.    Pain Score 7    Pain Location Back   Pain Orientation Lower;Left   Pain Descriptors / Indicators Shooting;Aching   Pain Type Chronic pain   Pain Radiating Towards to Lt hip only, not in the foot anymore   Pain Onset More than a month ago   Pain Frequency Constant   Aggravating Factors  moving around   Pain Relieving Factors lying on her stomach, prone press ups. Ice and TENS                                     PT Short Term Goals - 08/22/14 1128    PT SHORT TERM GOAL #1   Title independent with HEP (09/04/14)   Status Achieved   PT SHORT TERM GOAL #2   Title improve ROM by 5 degrees of lumbar spine for improved mobility (09/04/14)   Status  On-going   PT SHORT TERM GOAL #3   Title report pain < 5/10 for improved function (09/04/14)   Status On-going   PT SHORT TERM GOAL #4   Title tolerate sitting/standing/walking at least 20 min each position without increase in pain for improved function (09/04/14)  at 15 minutes today   Status On-going           PT Long Term Goals - 08/22/14 1135    PT LONG TERM GOAL #1   Title independent with advanced HEP (10/03/14)   Status On-going   PT LONG TERM GOAL #2   Title verbalize understanding of posture/body mechanics to reduce the risk of reinjury (10/03/14)   Status On-going   PT LONG TERM GOAL #3   Title lumbar ROM improved by 10 degrees all motions for improved function (10/03/14)   Status On-going   PT LONG TERM GOAL #4   Title tolerate walking > 30 min without increase in pain for improved function (10/03/14)   Status On-going   PT LONG TERM GOAL #5  Title report pain < 3/10 for improved pain (10/03/14)   Status On-going               Plan - 08/22/14 1148    Clinical Impression Statement Pt continues to have good relief with the use of traction.  She is very weak in her core and hips.  If pt does have a prolasped bladder this could be contributing to back pain.     Pt will benefit from skilled therapeutic intervention in order to improve on the following deficits Decreased endurance;Decreased balance;Pain;Obesity;Decreased strength;Decreased mobility;Abnormal gait;Decreased range of motion;Impaired flexibility;Improper body mechanics;Postural dysfunction;Difficulty walking   Rehab Potential Good   PT Frequency 2x / week   PT Duration 8 weeks   PT Treatment/Interventions Patient/family education;Therapeutic exercise;Traction   PT Next Visit Plan progress HEP to add extension based stability ther ex   Consulted and Agree with Plan of Care Patient        Problem List Patient Active Problem List   Diagnosis Date Noted  . Dehydration 06/26/2014  . Left-sided low back  pain with left-sided sciatica 06/19/2014  . Severe obesity (BMI >= 40) 11/17/2013  . Depression 09/21/2013  . Osteoarthritis of both hips 08/25/2013  . Hypertriglyceridemia 08/24/2013  . Foot fracture, right 08/22/2013  . Fibromyalgia 07/18/2013  . Neuropathy 07/18/2013  . Low back pain with radiation 07/18/2013  . Migraines 07/18/2013  . Atrophic vaginitis 02/26/2012    Soleia Badolato,SUE 08/23/2014, 11:17 AM  [facility] [place], [place], [address] Phone: [phone]   Fax:  [phone]

## 2014-08-24 ENCOUNTER — Telehealth: Payer: Self-pay

## 2014-08-24 NOTE — Telephone Encounter (Signed)
Kathryn Cline wants something for her pelvic/bladder pain. She states she has tried ibuprofen 800 mg every 8 hours. I advised her to rest, apply heat and take the ibuprofen as directed. Patient is aware that [name] will be off until tomorrow.

## 2014-08-25 ENCOUNTER — Emergency Department (HOSPITAL_BASED_OUTPATIENT_CLINIC_OR_DEPARTMENT_OTHER)
Admission: EM | Admit: 2014-08-25 | Discharge: 2014-08-25 | Disposition: A | Discharge status: Home / Self Care | Payer: 59 | Attending: Emergency Medicine | Admitting: Emergency Medicine

## 2014-08-25 ENCOUNTER — Ambulatory Visit (INDEPENDENT_AMBULATORY_CARE_PROVIDER_SITE_OTHER): Payer: 59 | Admitting: Physical Therapy

## 2014-08-25 ENCOUNTER — Encounter (HOSPITAL_BASED_OUTPATIENT_CLINIC_OR_DEPARTMENT_OTHER): Payer: Self-pay | Admitting: *Deleted

## 2014-08-25 ENCOUNTER — Encounter (INDEPENDENT_AMBULATORY_CARE_PROVIDER_SITE_OTHER): Payer: Self-pay

## 2014-08-25 DIAGNOSIS — K219 Gastro-esophageal reflux disease without esophagitis: Secondary | ICD-10-CM | POA: Insufficient documentation

## 2014-08-25 DIAGNOSIS — N390 Urinary tract infection, site not specified: Secondary | ICD-10-CM | POA: Insufficient documentation

## 2014-08-25 DIAGNOSIS — M797 Fibromyalgia: Secondary | ICD-10-CM | POA: Insufficient documentation

## 2014-08-25 DIAGNOSIS — Z792 Long term (current) use of antibiotics: Secondary | ICD-10-CM | POA: Diagnosis not present

## 2014-08-25 DIAGNOSIS — M545 Low back pain: Secondary | ICD-10-CM | POA: Diagnosis not present

## 2014-08-25 DIAGNOSIS — R000 Tachycardia, unspecified: Secondary | ICD-10-CM | POA: Diagnosis not present

## 2014-08-25 DIAGNOSIS — Z79899 Other long term (current) drug therapy: Secondary | ICD-10-CM | POA: Diagnosis not present

## 2014-08-25 DIAGNOSIS — I10 Essential (primary) hypertension: Secondary | ICD-10-CM | POA: Insufficient documentation

## 2014-08-25 DIAGNOSIS — Z720 Tobacco use: Secondary | ICD-10-CM | POA: Insufficient documentation

## 2014-08-25 DIAGNOSIS — R29898 Other symptoms and signs involving the musculoskeletal system: Secondary | ICD-10-CM

## 2014-08-25 DIAGNOSIS — R1110 Vomiting, unspecified: Secondary | ICD-10-CM | POA: Insufficient documentation

## 2014-08-25 DIAGNOSIS — G629 Polyneuropathy, unspecified: Secondary | ICD-10-CM | POA: Diagnosis not present

## 2014-08-25 DIAGNOSIS — R1030 Lower abdominal pain, unspecified: Secondary | ICD-10-CM | POA: Diagnosis present

## 2014-08-25 LAB — COMPREHENSIVE METABOLIC PANEL
ALBUMIN: 4.3 g/dL (ref 3.5–5.2)
ALK PHOS: 59 U/L (ref 39–117)
ALT: 48 U/L — ABNORMAL HIGH (ref 0–35)
ANION GAP: 9 (ref 5–15)
AST: 41 U/L — AB (ref 0–37)
BUN: 10 mg/dL (ref 6–23)
CHLORIDE: 105 mmol/L (ref 96–112)
CO2: 25 mmol/L (ref 19–32)
Calcium: 9.2 mg/dL (ref 8.4–10.5)
Creatinine, Ser: 0.67 mg/dL (ref 0.50–1.10)
GFR calc Af Amer: >90 mL/min (ref >90)
GFR calc non Af Amer: >90 mL/min (ref >90)
Glucose, Bld: 105 mg/dL — ABNORMAL HIGH (ref 70–99)
Potassium: 3.6 mmol/L (ref 3.5–5.1)
SODIUM: 139 mmol/L (ref 135–145)
TOTAL PROTEIN: 7 g/dL (ref 6.0–8.3)
Total Bilirubin: 0.6 mg/dL (ref 0.3–1.2)

## 2014-08-25 LAB — CBC WITH DIFFERENTIAL/PLATELET
BASOS ABS: 0 10*3/uL (ref 0.0–0.1)
Basophils Relative: 0 % (ref 0–1)
EOS ABS: 0.1 10*3/uL (ref 0.0–0.7)
Eosinophils Relative: 1 % (ref 0–5)
HCT: 41.6 % (ref 36.0–46.0)
Hemoglobin: 13.9 g/dL (ref 12.0–15.0)
LYMPHS ABS: 3.4 10*3/uL (ref 0.7–4.0)
Lymphocytes Relative: 27 % (ref 12–46)
MCH: 31.2 pg (ref 26.0–34.0)
MCHC: 33.4 g/dL (ref 30.0–36.0)
MCV: 93.3 fL (ref 78.0–100.0)
Monocytes Absolute: 0.8 10*3/uL (ref 0.1–1.0)
Monocytes Relative: 6 % (ref 3–12)
NEUTROS ABS: 8.2 10*3/uL — AB (ref 1.7–7.7)
Neutrophils Relative %: 66 % (ref 43–77)
Platelets: 234 10*3/uL (ref 150–400)
RBC: 4.46 MIL/uL (ref 3.87–5.11)
RDW: 14 % (ref 11.5–15.5)
WBC: 12.5 10*3/uL — AB (ref 4.0–10.5)

## 2014-08-25 LAB — URINALYSIS, ROUTINE W REFLEX MICROSCOPIC
GLUCOSE, UA: NEGATIVE mg/dL
HGB URINE DIPSTICK: NEGATIVE
Ketones, ur: 15 mg/dL — AB
Nitrite: POSITIVE — AB
Protein, ur: NEGATIVE mg/dL
SPECIFIC GRAVITY, URINE: 1.015 (ref 1.005–1.030)
Urobilinogen, UA: 4 mg/dL — ABNORMAL HIGH (ref 0.0–1.0)
pH: 5 (ref 5.0–8.0)

## 2014-08-25 LAB — URINE MICROSCOPIC-ADD ON

## 2014-08-25 MED ORDER — DEXTROSE 5 % IV SOLN
1.0000 g | Freq: Once | INTRAVENOUS | Status: AC
  Administered 2014-08-25: 1 g via INTRAVENOUS

## 2014-08-25 MED ORDER — CEPHALEXIN 500 MG PO CAPS: 500.0000 mg | ORAL_CAPSULE | Freq: Four times a day (QID) | ORAL | Status: DC

## 2014-08-25 MED ORDER — CEFTRIAXONE SODIUM 1 G IJ SOLR
INTRAMUSCULAR | Status: AC
  Administered 2014-08-25: 1000 mg
  Filled 2014-08-25: qty 10

## 2014-08-25 MED ORDER — FLUCONAZOLE 200 MG PO TABS: 200.0000 mg | ORAL_TABLET | Freq: Every day | ORAL | Status: DC

## 2014-08-25 MED ORDER — MORPHINE SULFATE 4 MG/ML IJ SOLN
4.0000 mg | Freq: Once | INTRAMUSCULAR | Status: AC
  Administered 2014-08-25: 4 mg via INTRAVENOUS
  Filled 2014-08-25: qty 1

## 2014-08-25 MED ORDER — ONDANSETRON HCL 4 MG/2ML IJ SOLN
4.0000 mg | Freq: Once | INTRAMUSCULAR | Status: AC
  Administered 2014-08-25: 4 mg via INTRAVENOUS
  Filled 2014-08-25: qty 2

## 2014-08-25 MED ORDER — SODIUM CHLORIDE 0.9 % IV BOLUS (SEPSIS)
1000.0000 mL | Freq: Once | INTRAVENOUS | Status: AC
Start: 2014-08-25 — End: 2014-08-25
  Administered 2014-08-25: 1000 mL via INTRAVENOUS

## 2014-08-25 NOTE — Therapy (Signed)
[place] [address] [place], [place], [address] Phone: [phone]   Fax:  [phone]  Physical Therapy Treatment  Patient Details  Name: Kathryn Cline MRN: [id number] Date of Birth: 1975/06/16 Referring Provider:  Donella Stade, PA-C  Encounter Date: 08/25/2014      PT End of Session - 08/25/14 1101    Visit Number 6   Number of Visits 16   Date for PT Re-Evaluation 10/06/14   PT Start Time 1101   PT Stop Time 1129   PT Time Calculation (min) 28 min   Activity Tolerance Patient limited by pain  Unable to perform any therex beyond NuStep. Could not tolerate traction due to increased pain in stomach.   Behavior During Therapy [facility] for tasks assessed/performed      Past Medical History  Diagnosis Date  . Neuropathy   . Fibromyalgia   . GERD (gastroesophageal reflux disease)   . Allergy   . Hypertension     Past Surgical History  Procedure Laterality Date  . Appendectomy    . Abdominal hysterectomy    . Wrist surgery    . Left foot surgery    . Cyst removal trunk    . Cholecystectomy      There were no vitals filed for this visit.  Visit Diagnosis:  Midline low back pain, with sciatica presence unspecified  Weakness of both lower extremities      Subjective Assessment - 08/25/14 1105    Subjective Patient in a lot of pain today. She tried to get into ER but it was 6 hour wait and she couldn't sit that long. Called her PCP and waiting for return call. Sees urologist Monday.   Currently in Pain? Yes   Pain Score 7    Pain Location Back  Patient reports she doesn't know if its her back or bladder issue causing the pain.   Pain Orientation Left;Lower   Pain Descriptors / Indicators Aching   Multiple Pain Sites Yes   Pain Score 9   Pain Location Bladder   Pain Orientation Right;Left   Pain Descriptors / Indicators Burning   Pain Type Acute pain   Pain Onset 1 to 4 weeks ago   Pain  Frequency Constant   Aggravating Factors  --  patient not having urge to urinate, but tries to.                          [place] Adult PT Treatment/Exercise - 08/25/14 0001    Lumbar Exercises: Aerobic   Stationary Bike NuStep L3x8 min   Lumbar Exercises: Supine   Ab Set 10 reps;5 seconds  using pelvic floor cueing; too painful to complete.   Bridge 5 reps  too painful to perform more reps.   Traction   Type of Traction Lumbar   Min (lbs) 35   Max (lbs) 70   Hold Time 60   Rest Time 20   Time 5  Pt could not tolerate traction beyond 15 #, so tx stopped.                  PT Short Term Goals - 08/22/14 1128    PT SHORT TERM GOAL #1   Title independent with HEP (09/04/14)   Status Achieved   PT SHORT TERM GOAL #2   Title improve ROM by 5 degrees of lumbar spine for improved mobility (09/04/14)   Status On-going   PT SHORT  TERM GOAL #3   Title report pain < 5/10 for improved function (09/04/14)   Status On-going   PT SHORT TERM GOAL #4   Title tolerate sitting/standing/walking at least 20 min each position without increase in pain for improved function (09/04/14)  at 15 minutes today   Status On-going           PT Long Term Goals - 08/22/14 1135    PT LONG TERM GOAL #1   Title independent with advanced HEP (10/03/14)   Status On-going   PT LONG TERM GOAL #2   Title verbalize understanding of posture/body mechanics to reduce the risk of reinjury (10/03/14)   Status On-going   PT LONG TERM GOAL #3   Title lumbar ROM improved by 10 degrees all motions for improved function (10/03/14)   Status On-going   PT LONG TERM GOAL #4   Title tolerate walking > 30 min without increase in pain for improved function (10/03/14)   Status On-going   PT LONG TERM GOAL #5   Title report pain < 3/10 for improved pain (10/03/14)   Status On-going               Plan - 08/25/14 1127    Clinical Impression Statement Patient did not tolerate therex today other than  NuStep. Traction was attempted but had to be discontinued at 15 # of pull because pain was increasing iin patients stomach with the increased tension. Patient plans to f/u with PCP today.    PT Next Visit Plan progress HEP to add extension based stability ther ex if patient can tolerate treatment.        Problem List Patient Active Problem List   Diagnosis Date Noted  . Chronic low back pain 08/23/2014  . Dehydration 06/26/2014  . Left-sided low back pain with left-sided sciatica 06/19/2014  . Severe obesity (BMI >= 40) 11/17/2013  . Depression 09/21/2013  . Osteoarthritis of both hips 08/25/2013  . Hypertriglyceridemia 08/24/2013  . Foot fracture, right 08/22/2013  . Fibromyalgia 07/18/2013  . Neuropathy 07/18/2013  . Low back pain with radiation 07/18/2013  . Migraines 07/18/2013  . Atrophic vaginitis 02/26/2012   Madelyn Flavors PT  08/25/2014, 1:16 PM  [facility] [place], [place], [address] Phone: [phone]   Fax:  [phone]

## 2014-08-25 NOTE — ED Notes (Addendum)
Abdominal pain, no appetite, and nausea. Dysuria.

## 2014-08-25 NOTE — ED Provider Notes (Signed)
CSN: [id number]     Arrival date & time 08/25/14  1824 History  This chart was scribed for Blanchie Dessert, MD by Peyton Bottoms, ED Scribe. This patient was seen in room MH01/MH01 and the patient's care was started at 6:41 PM.   Chief Complaint  Patient presents with  . Abdominal Pain   Patient is a 39 y.o. female presenting with abdominal pain and vomiting. The history is provided by the patient. No language interpreter was used.  Abdominal Pain Pain location:  Suprapubic Pain quality: aching   Pain radiates to:  Back Pain severity:  Moderate Onset quality:  Gradual Duration:  5 weeks Timing:  Constant Progression:  Waxing and waning Chronicity:  New Associated symptoms: vomiting   Emesis Severity:  Moderate Associated symptoms: abdominal pain    HPI Comments: Kathryn Cline is a 39 y.o. female with a PMHx of neuropathy, fibromyalgia, GERD, hypertension, abdominal hysterectomy, appendectomy and left foot surgery, who presents to the Emergency Department complaining of moderate lower back pain, abdominal pain, dysuria and a "burning sensation in stomach" onset 5 days ago. Patient was seen for symptoms at [facility] 4 days ago and was discharged with no further treatment. Patient reports gradual worsening of symptoms since being discharged. Patient reports associated nausea, vomiting, vaginal irritation, and vaginal drainage. She also reports more pain to right lower back than left. She denies associated odor from vaginal drainage and describes it to be "watery". Patient states that she is was put on Ciprofloxacin by PCP Dr. Alden Hipp 3 weeks ago for 3 days however symptoms recurred. Patient denies associated fever or rashes. She denies hx of kidney stones or diabetes. She reports hx of similar symptoms in her teenage years which led to a diagnosis for pyelonephritis. Patient is scheduled to be seen by a Urologist on Monday but states that her pain is too severe to wait to be  seen.  Past Medical History  Diagnosis Date  . Neuropathy   . Fibromyalgia   . GERD (gastroesophageal reflux disease)   . Allergy   . Hypertension    Past Surgical History  Procedure Laterality Date  . Appendectomy    . Abdominal hysterectomy    . Wrist surgery    . Left foot surgery    . Cyst removal trunk    . Cholecystectomy     Family History  Problem Relation Age of Onset  . Heart attack Brother   . Hypertension Brother   . Heart attack Maternal Grandmother   . Hypertension Maternal Grandmother   . Cancer Maternal Grandfather   . Heart attack Maternal Grandfather   . Hypertension Maternal Grandfather   . Cancer Paternal Grandmother   . Heart attack Paternal Grandmother   . Hypertension Paternal Grandmother   . Stroke Paternal Grandmother   . Heart attack Paternal Grandfather   . Hypertension Paternal Grandfather   . Hypertension Brother    History  Substance Use Topics  . Smoking status: Current Every Day Smoker  . Smokeless tobacco: Not on file  . Alcohol Use: No   OB History    No data available     Review of Systems  Gastrointestinal: Positive for vomiting and abdominal pain.   A complete 10 system review of systems was obtained and all systems are negative except as noted in the HPI and PMH.    Allergies  Viibryd; Amitriptyline; Azithromycin; Brintellix; Bupropion; Effexor; Gabapentin; Lyrica; and Vicodin  Home Medications   Prior to Admission medications   Medication  Sig Start Date End Date Taking? Authorizing Provider  AMBULATORY NON FORMULARY MEDICATION Stress B-complex    Historical Provider, MD  AMBULATORY NON FORMULARY MEDICATION Tens units   Dx: chronic pain, fibromyalgia 02/03/14   [name] [name], PA-C  celecoxib (CELEBREX) 200 MG capsule Take 1 capsule (200 mg total) by mouth 2 (two) times daily. 08/08/14   [name] [name], PA-C  cetirizine (ZYRTEC) 10 MG tablet Take 10 mg by mouth daily.    Historical Provider, MD  cholecalciferol  (VITAMIN D) 1000 UNITS tablet Take 3,000 Units by mouth daily.    Historical Provider, MD  ciprofloxacin (CIPRO) 500 MG tablet Take 1 tablet (500 mg total) by mouth 2 (two) times daily. 08/08/14   [name] [name], PA-C  clonazePAM (KLONOPIN) 0.5 MG tablet Take 0.5-1 tablets (0.25-0.5 mg total) by mouth daily as needed for anxiety. 05/04/14   Hali Marry, MD  diclofenac sodium (VOLTAREN) 1 % GEL Apply 4 g topically 4 (four) times daily. 08/19/13   [name] [name], PA-C  furosemide (LASIX) 40 MG tablet TAKE ONE TABLET BY MOUTH ONCE DAILY 02/03/14   [name] [name], PA-C  HYDROcodone-acetaminophen (NORCO/VICODIN) 5-325 MG per tablet Take 1 tablet by mouth every 8 (eight) hours as needed for moderate pain. 06/30/14   [name] [name], PA-C  lisinopril (PRINIVIL,ZESTRIL) 20 MG tablet TAKE ONE TABLET BY MOUTH ONCE DAILY 08/07/14   [name] [name], PA-C  pantoprazole (PROTONIX) 40 MG tablet Take 1 tablet (40 mg total) by mouth 2 (two) times daily. 06/09/14   Donella Stade, PA-C  promethazine (PHENERGAN) 25 MG tablet Take 1 tablet (25 mg total) by mouth every 6 (six) hours as needed for nausea or vomiting. 06/26/14   [name], MD  rizatriptan (MAXALT) 5 MG tablet Take 5 mg by mouth as needed for migraine. May repeat in 2 hours if needed    Historical Provider, MD  sertraline (ZOLOFT) 50 MG tablet Take 1/2 tablet daily for 7 days, then increase to 1 full tablet daily. 08/09/14   Ruben Im, PA-C  sertraline (ZOLOFT) 50 MG tablet Take 1/2 tablet daily for 7 days, then increase to 1 full tablet daily. 08/09/14   Ruben Im, PA-C  topiramate (TOPAMAX) 50 MG tablet Take 50 mg by mouth daily.    Historical Provider, MD   Triage Vitals: BP 137/81 mmHg  Pulse 103  Temp(Src) 98.9 F (37.2 C) (Oral)  Resp 18  Ht 5' 9"  (1.753 m)  Wt 288 lb (130.636 kg)  BMI 42.51 kg/m2  SpO2 97%  Physical Exam  Constitutional: She is oriented to person, place, and time. She appears well-developed and  well-nourished. No distress.  HENT:  Head: Normocephalic and atraumatic.  Eyes: Conjunctivae and EOM are normal.  Neck: Neck supple. No tracheal deviation present.  Cardiovascular: Tachycardia present.   Pulmonary/Chest: Effort normal. No respiratory distress.  Abdominal: There is tenderness. There is guarding. There is no rebound.  Suprapubic tenderness with mild guarding. No rebound. Bilateral CVA tenderness.  Musculoskeletal: Normal range of motion.  Neurological: She is alert and oriented to person, place, and time.  Skin: Skin is warm and dry.  Psychiatric: She has a normal mood and affect. Her behavior is normal.  Nursing note and vitals reviewed.  ED Course  Procedures (including critical care time)  DIAGNOSTIC STUDIES: Oxygen Saturation is 97% on RA, normal by my interpretation.    COORDINATION OF CARE: 6:51 PM- Discussed plans to order diagnostic lab work and  urinalysis. Will give patient IV fluids, Zofran and Morphine. Pt advised of plan for treatment and pt agrees.  Labs Review Labs Reviewed  URINALYSIS, ROUTINE W REFLEX MICROSCOPIC - Abnormal; Notable for the following:    Color, Urine RED (*)    Bilirubin Urine MODERATE (*)    Ketones, ur 15 (*)    Urobilinogen, UA 4.0 (*)    Nitrite POSITIVE (*)    Leukocytes, UA MODERATE (*)    All other components within normal limits  CBC WITH DIFFERENTIAL/PLATELET - Abnormal; Notable for the following:    WBC 12.5 (*)    All other components within normal limits  COMPREHENSIVE METABOLIC PANEL - Abnormal; Notable for the following:    Glucose, Bld 105 (*)    AST 41 (*)    ALT 48 (*)    All other components within normal limits  URINE MICROSCOPIC-ADD ON - Abnormal; Notable for the following:    Squamous Epithelial / LPF FEW (*)    Bacteria, UA MANY (*)    All other components within normal limits   Imaging Review No results found.   EKG Interpretation None     MDM   Final diagnoses:  UTI (lower urinary tract  infection)     patient with symptoms most concerning for UTI versus pyelonephritis today. She is having nausea, vomiting, flank pain, suprapubic pain and dysuria. Patient does complain of a mild watery vaginal discharge but denies any bleeding and is status post hysterectomy. No new sexual partners or concern for STD at this time. Patient initially states that her symptoms have been going on 7-10 days however she was seen at the beginning of April for dysuria and at that time treated with Cipro for 3 days. However she states symptoms resolved momentarily and then returned and have been gradually worsening. She was seen at [facility] and at that time had normal urine and labs. She has an appointment with urology for Monday to evaluate for bladder prolapse or abnormality in bladder emptying however due to worsening symptoms.    on exam patient has suprapubic and bilateral flank tenderness. Low suspicion for appendicitis, diverticulitis, pancreatitis at this time.   CBC, CMP, UA pending. Patient given IV fluids, pain and nausea medicine.  7:48 PM CBC with leukocytosis of 12,000, normal CMP and UA consistent with infection. Will treat with rocephin and keflex.  I personally performed the services described in this documentation, which was scribed in my presence.  The recorded information has been reviewed and considered.   Blanchie Dessert, MD 08/25/14 2019

## 2014-08-25 NOTE — Telephone Encounter (Signed)
You already have norco. I have seen ER visit and urology note with chronic cystitis. Follow up with urology. Maybe they need to order different testing to see if some intersitial cystitis is going on. We could certainly give a strong NSAID. Have you tried meloxicam?

## 2014-08-28 NOTE — Telephone Encounter (Signed)
Patient seen in office

## 2014-08-29 ENCOUNTER — Encounter: Payer: Self-pay | Admitting: Physical Therapy

## 2014-09-01 ENCOUNTER — Encounter: Payer: Self-pay | Admitting: Physical Therapy

## 2014-09-01 ENCOUNTER — Ambulatory Visit (INDEPENDENT_AMBULATORY_CARE_PROVIDER_SITE_OTHER): Payer: 59 | Admitting: Physical Therapy

## 2014-09-01 DIAGNOSIS — R29898 Other symptoms and signs involving the musculoskeletal system: Secondary | ICD-10-CM

## 2014-09-01 DIAGNOSIS — M545 Low back pain: Secondary | ICD-10-CM

## 2014-09-01 NOTE — Therapy (Signed)
[place], [place], [address] Phone: [phone]   Fax:  [phone]  Physical Therapy Treatment  Patient Details  Name: Kathryn Cline MRN: [id number] Date of Birth: 1975/12/22 Referring Provider:  Donella Stade, PA-C  Encounter Date: 09/01/2014      PT End of Session - 09/01/14 1138    Visit Number 7   Number of Visits 16   Date for PT Re-Evaluation 10/06/14   PT Start Time 8280   PT Stop Time 1150   PT Time Calculation (min) 48 min      Past Medical History  Diagnosis Date  . Neuropathy   . Fibromyalgia   . GERD (gastroesophageal reflux disease)   . Allergy   . Hypertension     Past Surgical History  Procedure Laterality Date  . Appendectomy    . Abdominal hysterectomy    . Wrist surgery    . Left foot surgery    . Cyst removal trunk    . Cholecystectomy      There were no vitals filed for this visit.  Visit Diagnosis:  Midline low back pain, with sciatica presence unspecified  Weakness of both lower extremities      Subjective Assessment - 09/01/14 1107    Currently in Pain? Yes   Pain Score 2    Pain Location Back   Pain Type Chronic pain   Pain Frequency Occasional   Aggravating Factors  walking   Pain Relieving Factors lying with LEs elevated                         [facility] Adult PT Treatment/Exercise - 09/01/14 0001    Lumbar Exercises: Aerobic   Stationary Bike NuStep level 4 x 5 minutes   Lumbar Exercises: Supine   Ab Set 10 reps;5 seconds  the PPT with knees in/out and PPT with heel slide with cuing   Lumbar Exercises: Prone   Straight Leg Raise 20 reps   Opposite Arm/Leg Raise --  difficult!  5x Bilat   Other Prone Lumbar Exercises pelvic press x 10 with 5 sec hold   Modalities   Modalities Traction   Traction   Type of Traction Lumbar   Min (lbs) 35   Max (lbs) 70   Hold Time 60   Rest Time 20   Time 20   Manual Therapy    Manual Therapy Joint mobilization  grade I-II mobes Sacrum, L5 for pain relief                  PT Short Term Goals - 09/01/14 1139    PT SHORT TERM GOAL #1   Title independent with HEP (09/04/14)   Status Achieved   PT SHORT TERM GOAL #2   Title improve ROM by 5 degrees of lumbar spine for improved mobility (09/04/14)   Status On-going   PT SHORT TERM GOAL #3   Title report pain < 5/10 for improved function (09/04/14)   Status Achieved   PT SHORT TERM GOAL #4   Title tolerate sitting/standing/walking at least 20 min each position without increase in pain for improved function (09/04/14)   Status On-going           PT Long Term Goals - 09/01/14 1139    PT LONG TERM GOAL #1   Title independent with advanced HEP (10/03/14)   Status On-going   PT LONG TERM GOAL #2   Title  verbalize understanding of posture/body mechanics to reduce the risk of reinjury (10/03/14)   Status On-going   PT LONG TERM GOAL #3   Title lumbar ROM improved by 10 degrees all motions for improved function (10/03/14)   Status On-going   PT LONG TERM GOAL #4   Title tolerate walking > 30 min without increase in pain for improved function (10/03/14)   Status On-going               Plan - 09/01/14 1138    Clinical Impression Statement Pt much improved since UTI is resolved. Able to tolerate full treatment session today   Pt will benefit from skilled therapeutic intervention in order to improve on the following deficits Decreased endurance;Decreased balance;Pain;Obesity;Decreased strength;Decreased mobility;Abnormal gait;Decreased range of motion;Impaired flexibility;Improper body mechanics;Postural dysfunction;Difficulty walking   Rehab Potential Good   PT Frequency 2x / week   PT Duration 8 weeks   PT Treatment/Interventions Patient/family education;Therapeutic exercise;Traction   PT Next Visit Plan progress HEP as tolerated   Consulted and Agree with Plan of Care Patient        Problem  List Patient Active Problem List   Diagnosis Date Noted  . Chronic low back pain 08/23/2014  . Dehydration 06/26/2014  . Left-sided low back pain with left-sided sciatica 06/19/2014  . Severe obesity (BMI >= 40) 11/17/2013  . Depression 09/21/2013  . Osteoarthritis of both hips 08/25/2013  . Hypertriglyceridemia 08/24/2013  . Foot fracture, right 08/22/2013  . Fibromyalgia 07/18/2013  . Neuropathy 07/18/2013  . Low back pain with radiation 07/18/2013  . Migraines 07/18/2013  . Atrophic vaginitis 02/26/2012   Isabelle Course, PT, DPT  09/01/2014, 11:41 AM  [facility] [place], [place], [address] Phone: [phone]   Fax:  [phone]

## 2014-09-06 ENCOUNTER — Ambulatory Visit (INDEPENDENT_AMBULATORY_CARE_PROVIDER_SITE_OTHER): Payer: 59 | Admitting: Physical Therapy

## 2014-09-06 DIAGNOSIS — R29898 Other symptoms and signs involving the musculoskeletal system: Secondary | ICD-10-CM | POA: Diagnosis not present

## 2014-09-06 DIAGNOSIS — M545 Low back pain: Secondary | ICD-10-CM | POA: Diagnosis not present

## 2014-09-06 NOTE — Therapy (Addendum)
[place], [place], [address] Phone: [phone]   Fax:  [phone]  Physical Therapy Treatment  Patient Details  Name: Kathryn Cline MRN: [id number] Date of Birth: 04-Jul-1975 Referring Provider:  Donella Stade, PA-C  Encounter Date: 09/06/2014      PT End of Session - 09/12/14 1105    Visit Number 10   Number of Visits 16   Date for PT Re-Evaluation 10/06/14   PT Start Time 1104   PT Stop Time 1207   PT Time Calculation (min) 63 min   Activity Tolerance Patient limited by pain;Patient limited by fatigue      Past Medical History  Diagnosis Date  . Neuropathy   . Fibromyalgia   . GERD (gastroesophageal reflux disease)   . Allergy   . Hypertension     Past Surgical History  Procedure Laterality Date  . Appendectomy    . Abdominal hysterectomy    . Wrist surgery    . Left foot surgery    . Cyst removal trunk    . Cholecystectomy      There were no vitals filed for this visit.  Visit Diagnosis:  Midline low back pain, with sciatica presence unspecified  Weakness of both lower extremities      Subjective Assessment - 09/12/14 1105    Subjective Pt reports initially had pain relief with Ktape, but had reaction from tape (was itchy and had "welts"). Removed tape yesterday.  Rode 45 min in car; made back pain worsen.    Currently in Pain? Yes   Pain Score 6    Pain Location Hip   Pain Orientation Left   Pain Descriptors / Indicators Aching   Aggravating Factors  walking, prolonged sitting    Pain Relieving Factors lying with LE elevated, stretches                             Sacral mobs, grade III Jeral Pinch, PT 09/13/14 1500 late entry       PT Short Term Goals - 09/01/14 1139    PT SHORT TERM GOAL #1   Title independent with HEP (09/04/14)   Status Achieved   PT SHORT TERM GOAL #2   Title improve ROM by 5 degrees of lumbar spine for  improved mobility (09/04/14)   Status On-going   PT SHORT TERM GOAL #3   Title report pain < 5/10 for improved function (09/04/14)   Status Achieved   PT SHORT TERM GOAL #4   Title tolerate sitting/standing/walking at least 20 min each position without increase in pain for improved function (09/04/14)   Status On-going           PT Long Term Goals - 09/01/14 1139    PT LONG TERM GOAL #1   Title independent with advanced HEP (10/03/14)   Status On-going   PT LONG TERM GOAL #2   Title verbalize understanding of posture/body mechanics to reduce the risk of reinjury (10/03/14)   Status On-going   PT LONG TERM GOAL #3   Title lumbar ROM improved by 10 degrees all motions for improved function (10/03/14)   Status On-going   PT LONG TERM GOAL #4   Title tolerate walking > 30 min without increase in pain for improved function (10/03/14)   Status On-going               Plan - 09/12/14 1203  Clinical Impression Statement Pt was unable to tolerate kinesiotape due to irritation of skin. Pt tolerated all exercises without increase in pain, however fatigued quickly. Lumbar ROM remains the same. Pt reported decrease in pain to 3/10 post tx.    Pt will benefit from skilled therapeutic intervention in order to improve on the following deficits Decreased endurance;Decreased balance;Pain;Obesity;Decreased strength;Decreased mobility;Abnormal gait;Decreased range of motion;Impaired flexibility;Improper body mechanics;Postural dysfunction;Difficulty walking   Rehab Potential Good   PT Frequency 2x / week   PT Duration 8 weeks   PT Treatment/Interventions Patient/family education;Therapeutic exercise;Traction;Manual techniques   PT Next Visit Plan Continue core/ LE strengthening as tolerated. Assess tolerance to new HEP exercises.    Consulted and Agree with Plan of Care Patient        Problem List Patient Active Problem List   Diagnosis Date Noted  . Chronic low back pain 08/23/2014  .  Dehydration 06/26/2014  . Left-sided low back pain with left-sided sciatica 06/19/2014  . Severe obesity (BMI >= 40) 11/17/2013  . Depression 09/21/2013  . Osteoarthritis of both hips 08/25/2013  . Hypertriglyceridemia 08/24/2013  . Foot fracture, right 08/22/2013  . Fibromyalgia 07/18/2013  . Neuropathy 07/18/2013  . Low back pain with radiation 07/18/2013  . Migraines 07/18/2013  . Atrophic vaginitis 02/26/2012    Kerin Perna, PTA 09/13/2014 2:58 PM  [place], [place], [address] Phone: [phone]   Fax:  [phone]

## 2014-09-06 NOTE — Patient Instructions (Addendum)
Outer Hip Stretch: Reclined IT Band Stretch (Strap)   Strap around opposite foot, pull across only as far as possible with shoulders on mat. Hold for _5___ breaths. Repeat _3___ times each leg.   Straight Leg Raise   Tighten stomach and slowly raise locked Left  leg _6___ inches from floor. Repeat __5__ times per set. Do __2-3__ sets per session. Do _1___ sessions per day.   [facility] [place], [place] [address]  [phone] (office) [phone] (fax)

## 2014-09-08 ENCOUNTER — Ambulatory Visit (INDEPENDENT_AMBULATORY_CARE_PROVIDER_SITE_OTHER): Payer: 59 | Admitting: Physical Therapy

## 2014-09-08 DIAGNOSIS — M545 Low back pain: Secondary | ICD-10-CM

## 2014-09-08 DIAGNOSIS — R29898 Other symptoms and signs involving the musculoskeletal system: Secondary | ICD-10-CM

## 2014-09-08 NOTE — Therapy (Signed)
[place], [place], [address] Phone: [phone]   Fax:  [phone]  Physical Therapy Treatment  Patient Details  Name: Kathryn Cline MRN: [id number] Date of Birth: 31-Dec-1975 Referring Provider:  Donella Stade, PA-C  Encounter Date: 09/08/2014      PT End of Session - 09/08/14 1110    Visit Number 9   Number of Visits 16   Date for PT Re-Evaluation 10/06/14   PT Start Time 1107   PT Stop Time 1205   PT Time Calculation (min) 58 min   Activity Tolerance Patient tolerated treatment well      Past Medical History  Diagnosis Date  . Neuropathy   . Fibromyalgia   . GERD (gastroesophageal reflux disease)   . Allergy   . Hypertension     Past Surgical History  Procedure Laterality Date  . Appendectomy    . Abdominal hysterectomy    . Wrist surgery    . Left foot surgery    . Cyst removal trunk    . Cholecystectomy      There were no vitals filed for this visit.  Visit Diagnosis:  Midline low back pain, with sciatica presence unspecified  Weakness of both lower extremities      Subjective Assessment - 09/08/14 1107    Subjective Today feeling worse than last session; doesn't note anything strenuous to cause pain. Pt reports weather is playing a role in pain today.    Pertinent History HTN, asthma, fibromyalgia, neuropathy   Currently in Pain? Yes   Pain Score 6    Pain Location Hip   Pain Orientation Left   Pain Descriptors / Indicators Aching            [facility] PT Assessment - 09/08/14 0001    Assessment   Medical Diagnosis LBP                     [facility] Adult PT Treatment/Exercise - 09/08/14 0001    Exercises   Exercises Knee/Hip;Lumbar   Lumbar Exercises: Aerobic   Stationary Bike NuStep L4: 15mn  (arms and legs)   Lumbar Exercises: Prone   Other Prone Lumbar Exercises prone press ups x 5 reps    Other Prone Lumbar Exercises pelvic press 5 sec hold  x 10, with unilateral knee flexion x 5 each leg   Lumbar Exercises: Quadruped   Madcat/Old Horse 5 reps   Straight Leg Raise 5 reps each leg  2 sets (challenging)   Knee/Hip Exercises: Seated   Other Seated Knee Exercises Seated on Red therapy ball:  gentle bounces, pelvic tilts side/side, ant/post, CW/CCW   Other Seated Knee Exercises Seated on dyna-disc: seated marching x 20 steps x 2 sets; opp arm and leg x 10 (challenge)    Traction   Type of Traction Lumbar   Min (lbs) 35   Max (lbs) 70   Hold Time 60   Rest Time 20   Time 20   Manual Therapy   Manual Therapy Other (comment)   Myofascial Release kinesiotape applied to Lt piriformis to assist with decreased sensitivity and pain                 PT Education - 09/08/14 1153    Education provided Yes   Education Details HEP - added quadruped exercises   Person(s) Educated Patient   Methods Handout;Explanation   Comprehension Verbalized understanding;Returned demonstration  PT Short Term Goals - 09/01/14 1139    PT SHORT TERM GOAL #1   Title independent with HEP (09/04/14)   Status Achieved   PT SHORT TERM GOAL #2   Title improve ROM by 5 degrees of lumbar spine for improved mobility (09/04/14)   Status On-going   PT SHORT TERM GOAL #3   Title report pain < 5/10 for improved function (09/04/14)   Status Achieved   PT SHORT TERM GOAL #4   Title tolerate sitting/standing/walking at least 20 min each position without increase in pain for improved function (09/04/14)   Status On-going           PT Long Term Goals - 09/01/14 1139    PT LONG TERM GOAL #1   Title independent with advanced HEP (10/03/14)   Status On-going   PT LONG TERM GOAL #2   Title verbalize understanding of posture/body mechanics to reduce the risk of reinjury (10/03/14)   Status On-going   PT LONG TERM GOAL #3   Title lumbar ROM improved by 10 degrees all motions for improved function (10/03/14)   Status On-going   PT LONG TERM GOAL #4    Title tolerate walking > 30 min without increase in pain for improved function (10/03/14)   Status On-going               Plan - 09/08/14 1258    Clinical Impression Statement Pt tolerated all new exercises without increase in pain.  Pt reported decrease in LB pain/hip at end of session (to 3/10).     Pt will benefit from skilled therapeutic intervention in order to improve on the following deficits Decreased endurance;Decreased balance;Pain;Obesity;Decreased strength;Decreased mobility;Abnormal gait;Decreased range of motion;Impaired flexibility;Improper body mechanics;Postural dysfunction;Difficulty walking   Rehab Potential Good   PT Frequency 2x / week   PT Duration 8 weeks   PT Next Visit Plan Continue core/ LE strengthening as tolerated. Assess tolerance to new HEP exercises.    Consulted and Agree with Plan of Care Patient        Problem List Patient Active Problem List   Diagnosis Date Noted  . Chronic low back pain 08/23/2014  . Dehydration 06/26/2014  . Left-sided low back pain with left-sided sciatica 06/19/2014  . Severe obesity (BMI >= 40) 11/17/2013  . Depression 09/21/2013  . Osteoarthritis of both hips 08/25/2013  . Hypertriglyceridemia 08/24/2013  . Foot fracture, right 08/22/2013  . Fibromyalgia 07/18/2013  . Neuropathy 07/18/2013  . Low back pain with radiation 07/18/2013  . Migraines 07/18/2013  . Atrophic vaginitis 02/26/2012    Kerin Perna, PTA 09/08/2014 1:04 PM  [place], [place], [address] Phone: [phone]   Fax:  [phone]

## 2014-09-08 NOTE — Patient Instructions (Signed)
Angry Cat, All Fours   Kneel on hands and knees. Tuck chin and tighten stomach. Exhale and round back upward. Inhale and arch back downward. Hold each position __5_ seconds. Repeat _2-5__ times per session. Do 1___ sessions per day.     Bracing With Arm Raise (Quadruped)  On hands and knees find neutral spine. Tighten pelvic floor and abdominals and hold. Alternately lift arm to shoulder level. Repeat _5__ times each arm. Do ___ times a day.  Quadruped Alternate Hip Extension   Shift weight to one side and raise opposite leg. Keep trunk steady. _5-10__ reps per set, __1_ sets per day, _2-4__ days per week Repeat with other leg.  [facility] [place], [place] [address]  [phone] (office) [phone] (fax)

## 2014-09-11 ENCOUNTER — Other Ambulatory Visit: Payer: Self-pay | Admitting: Physician Assistant

## 2014-09-12 ENCOUNTER — Ambulatory Visit (INDEPENDENT_AMBULATORY_CARE_PROVIDER_SITE_OTHER): Payer: 59 | Admitting: Physical Therapy

## 2014-09-12 DIAGNOSIS — R29898 Other symptoms and signs involving the musculoskeletal system: Secondary | ICD-10-CM

## 2014-09-12 DIAGNOSIS — M545 Low back pain: Secondary | ICD-10-CM | POA: Diagnosis not present

## 2014-09-12 NOTE — Therapy (Signed)
[place], [place], [address] Phone: [phone]   Fax:  [phone]  Physical Therapy Treatment  Patient Details  Name: Kathryn Cline MRN: [id number] Date of Birth: 1975-09-22 Referring Provider:  Donella Stade, PA-C  Encounter Date: 09/12/2014      PT End of Session - 09/12/14 1105    Visit Number 10   Number of Visits 16   Date for PT Re-Evaluation 10/06/14   PT Start Time 1104   PT Stop Time 1207   PT Time Calculation (min) 63 min   Activity Tolerance Patient limited by pain;Patient limited by fatigue      Past Medical History  Diagnosis Date  . Neuropathy   . Fibromyalgia   . GERD (gastroesophageal reflux disease)   . Allergy   . Hypertension     Past Surgical History  Procedure Laterality Date  . Appendectomy    . Abdominal hysterectomy    . Wrist surgery    . Left foot surgery    . Cyst removal trunk    . Cholecystectomy      There were no vitals filed for this visit.  Visit Diagnosis:  Midline low back pain, with sciatica presence unspecified  Weakness of both lower extremities      Subjective Assessment - 09/12/14 1105    Subjective Pt reports initially had pain relief with Ktape, but had reaction from tape (was itchy and had "welts"). Removed tape yesterday.  Rode 45 min in car; made back pain worsen.    Currently in Pain? Yes   Pain Score 6    Pain Location Hip   Pain Orientation Left   Pain Descriptors / Indicators Aching   Aggravating Factors  walking, prolonged sitting    Pain Relieving Factors lying with LE elevated, stretches             [facility] PT Assessment - 09/12/14 0001    Assessment   Medical Diagnosis LBP   AROM   Lumbar Flexion 10  with pain    Lumbar Extension 18   with pain    Lumbar - Right Side Bend 30   Lumbar - Left Side Bend 18  with pain                      [facility] Adult PT Treatment/Exercise - 09/12/14 0001     Lumbar Exercises: Aerobic   Stationary Bike NuStep L4: 69mn  (arms and legs)   Lumbar Exercises: Quadruped   Madcat/Old Horse 5 reps   Straight Leg Raise 5 reps   Knee/Hip Exercises: Stretches   Gastroc Stretch 2 reps;20 seconds   Gastroc Stretch Limitations pt c/o pain in Lt LB with Lt calf stretch    Soleus Stretch 2 reps;20 seconds   Knee/Hip Exercises: Standing   Heel Raises 15 reps  with toe raises   Functional Squat 10 reps;2 sets  (mini - with UE support).  Sit to stands with core engaged and focus on glutes x 6 reps.    EMagazine features editorAction IFC    Electrical Stimulation Parameters to tolerance x 15 min    Electrical Stimulation Goals Pain   Traction   Type of Traction Lumbar   Min (lbs) 35   Max (lbs) 70   Hold Time 60   Rest Time 20   Time 20   Manual Therapy  Manual Therapy Other (comment);Myofascial release   Myofascial Release MFR to Rt piriformis/glute    Other Manual Therapy TPR to Lt piriformis with contract relax                   PT Short Term Goals - 09/01/14 1139    PT SHORT TERM GOAL #1   Title independent with HEP (09/04/14)   Status Achieved   PT SHORT TERM GOAL #2   Title improve ROM by 5 degrees of lumbar spine for improved mobility (09/04/14)   Status On-going   PT SHORT TERM GOAL #3   Title report pain < 5/10 for improved function (09/04/14)   Status Achieved   PT SHORT TERM GOAL #4   Title tolerate sitting/standing/walking at least 20 min each position without increase in pain for improved function (09/04/14)   Status On-going           PT Long Term Goals - 09/01/14 1139    PT LONG TERM GOAL #1   Title independent with advanced HEP (10/03/14)   Status On-going   PT LONG TERM GOAL #2   Title verbalize understanding of posture/body mechanics to reduce the risk of reinjury (10/03/14)   Status On-going   PT LONG TERM GOAL #3   Title lumbar ROM  improved by 10 degrees all motions for improved function (10/03/14)   Status On-going   PT LONG TERM GOAL #4   Title tolerate walking > 30 min without increase in pain for improved function (10/03/14)   Status On-going               Plan - 09/12/14 1203    Clinical Impression Statement Pt was unable to tolerate kinesiotape due to irritation of skin. Pt tolerated all exercises without increase in pain, however fatigued quickly. Lumbar ROM remains the same. Pt reported decrease in pain to 3/10 post tx.    Pt will benefit from skilled therapeutic intervention in order to improve on the following deficits Decreased endurance;Decreased balance;Pain;Obesity;Decreased strength;Decreased mobility;Abnormal gait;Decreased range of motion;Impaired flexibility;Improper body mechanics;Postural dysfunction;Difficulty walking   Rehab Potential Good   PT Frequency 2x / week   PT Duration 8 weeks   PT Treatment/Interventions Patient/family education;Therapeutic exercise;Traction;Manual techniques   PT Next Visit Plan Continue core/ LE strengthening as tolerated. Assess tolerance to new HEP exercises.    Consulted and Agree with Plan of Care Patient        Problem List Patient Active Problem List   Diagnosis Date Noted  . Chronic low back pain 08/23/2014  . Dehydration 06/26/2014  . Left-sided low back pain with left-sided sciatica 06/19/2014  . Severe obesity (BMI >= 40) 11/17/2013  . Depression 09/21/2013  . Osteoarthritis of both hips 08/25/2013  . Hypertriglyceridemia 08/24/2013  . Foot fracture, right 08/22/2013  . Fibromyalgia 07/18/2013  . Neuropathy 07/18/2013  . Low back pain with radiation 07/18/2013  . Migraines 07/18/2013  . Atrophic vaginitis 02/26/2012   Kerin Perna, PTA 09/12/2014 1:10 PM  [facility] [place], [place], [address] Phone: [phone]   Fax:  [phone]

## 2014-09-14 ENCOUNTER — Ambulatory Visit (INDEPENDENT_AMBULATORY_CARE_PROVIDER_SITE_OTHER): Payer: 59 | Admitting: Physical Therapy

## 2014-09-14 DIAGNOSIS — R29898 Other symptoms and signs involving the musculoskeletal system: Secondary | ICD-10-CM

## 2014-09-14 DIAGNOSIS — M545 Low back pain: Secondary | ICD-10-CM | POA: Diagnosis not present

## 2014-09-14 NOTE — Therapy (Signed)
[place], [place], [address] Phone: [phone]   Fax:  [phone]  Physical Therapy Treatment  Patient Details  Name: Kathryn Cline MRN: [id number] Date of Birth: [birth year] Referring Provider:  Donella Stade, PA-C  Encounter Date: 09/14/2014      PT End of Session - 09/14/14 1640    Visit Number 11   Number of Visits 16   Date for PT Re-Evaluation 10/06/14   PT Start Time 8099   PT Stop Time 8338   PT Time Calculation (min) 63 min   Activity Tolerance Patient limited by fatigue;Patient limited by pain      Past Medical History  Diagnosis Date  . Neuropathy   . Fibromyalgia   . GERD (gastroesophageal reflux disease)   . Allergy   . Hypertension     Past Surgical History  Procedure Laterality Date  . Appendectomy    . Abdominal hysterectomy    . Wrist surgery    . Left foot surgery    . Cyst removal trunk    . Cholecystectomy      There were no vitals filed for this visit.  Visit Diagnosis:  Midline low back pain, with sciatica presence unspecified  Weakness of both lower extremities      Subjective Assessment - 09/14/14 1540    Subjective Pt reports she gets pain relief  from traction and treatment, but pain increases with ADLs which require bending (picking up and washing clothes)    Currently in Pain? Yes   Pain Score 6    Pain Location Back   Pain Orientation Left;Mid   Pain Descriptors / Indicators Aching   Multiple Pain Sites Yes   Pain Score 6   Pain Location Hip   Pain Orientation Left   Pain Descriptors / Indicators Burning;Constant                         [facility] Adult PT Treatment/Exercise - 09/14/14 0001    Lumbar Exercises: Stretches   Lower Trunk Rotation 4 reps  to each side   ITB Stretch 20 seconds;2 reps   Lumbar Exercises: Aerobic   Stationary Bike NuStep L4: 61mn  (arms and legs)   Lumbar Exercises: Quadruped   Madcat/Old  Horse 10 reps   Single Arm Raise 5 reps  each side    Straight Leg Raise 5 reps   Other Quadruped Lumbar Exercises c-curve with spine (side to side)    Other Quadruped Lumbar Exercises fire hydrants x 5 each leg    Knee/Hip Exercises: Stretches   Gastroc Stretch 2 reps;20 seconds   Soleus Stretch 2 reps;20 seconds   Knee/Hip Exercises: Standing   Heel Raises 15 reps  with toe raises    SLS up to 6 sec Lt, up to 5 sec Rt x 4 trials each leg (challenge)   Other Standing Knee Exercises sit to stands from elevated high low table (mid-thigh height) x 10 reps x 2 sets    Other Standing Knee Exercises Hip abduction x 5 reps each leg x 2 sets.    EMagazine features editorAction IFC   Electrical Stimulation Parameters to tolerance x 20 min    Electrical Stimulation Goals Pain   Traction   Type of Traction Lumbar   Min (lbs) 35  38   Max (lbs) 75   Hold Time 60  Rest Time 20   Time 20                PT Education - 09/14/14 1713    Education provided Yes   Education Details Self care: instructed pt on self massage to Lt piriformis. Encouraged pt to perform stretches to decrease back pain.    Person(s) Educated Patient   Methods Explanation;Demonstration   Comprehension Verbalized understanding          PT Short Term Goals - 09/01/14 1139    PT SHORT TERM GOAL #1   Title independent with HEP (09/04/14)   Status Achieved   PT SHORT TERM GOAL #2   Title improve ROM by 5 degrees of lumbar spine for improved mobility (09/04/14)   Status On-going   PT SHORT TERM GOAL #3   Title report pain < 5/10 for improved function (09/04/14)   Status Achieved   PT SHORT TERM GOAL #4   Title tolerate sitting/standing/walking at least 20 min each position without increase in pain for improved function (09/04/14)   Status On-going           PT Long Term Goals - 09/01/14 1139    PT LONG TERM GOAL #1   Title  independent with advanced HEP (10/03/14)   Status On-going   PT LONG TERM GOAL #2   Title verbalize understanding of posture/body mechanics to reduce the risk of reinjury (10/03/14)   Status On-going   PT LONG TERM GOAL #3   Title lumbar ROM improved by 10 degrees all motions for improved function (10/03/14)   Status On-going   PT LONG TERM GOAL #4   Title tolerate walking > 30 min without increase in pain for improved function (10/03/14)   Status On-going               Plan - 09/14/14 1641    Clinical Impression Statement Pt reported decrease of pain to 4/10 with ther ex and 2/10 after estim and traction. Pt tolerated 5 additional reps with existing exercises before requiring rest break.    Pt will benefit from skilled therapeutic intervention in order to improve on the following deficits Decreased endurance;Decreased balance;Pain;Obesity;Decreased strength;Decreased mobility;Abnormal gait;Decreased range of motion;Impaired flexibility;Improper body mechanics;Postural dysfunction;Difficulty walking   Rehab Potential Good   PT Frequency 2x / week   PT Duration 8 weeks   PT Treatment/Interventions Patient/family education;Therapeutic exercise;Traction;Manual techniques   PT Next Visit Plan Continue core/ LE strengthening as tolerated.    Consulted and Agree with Plan of Care Patient        Problem List Patient Active Problem List   Diagnosis Date Noted  . Chronic low back pain 08/23/2014  . Dehydration 06/26/2014  . Left-sided low back pain with left-sided sciatica 06/19/2014  . Severe obesity (BMI >= 40) 11/17/2013  . Depression 09/21/2013  . Osteoarthritis of both hips 08/25/2013  . Hypertriglyceridemia 08/24/2013  . Foot fracture, right 08/22/2013  . Fibromyalgia 07/18/2013  . Neuropathy 07/18/2013  . Low back pain with radiation 07/18/2013  . Migraines 07/18/2013  . Atrophic vaginitis 02/26/2012   Kerin Perna, PTA 09/14/2014 5:14 PM  [facility] [place], [place], [address] Phone: [phone]   Fax:  [phone]

## 2014-09-15 ENCOUNTER — Encounter (HOSPITAL_COMMUNITY): Payer: Self-pay | Admitting: Physician Assistant

## 2014-09-15 ENCOUNTER — Ambulatory Visit (INDEPENDENT_AMBULATORY_CARE_PROVIDER_SITE_OTHER): Payer: 59 | Admitting: Physician Assistant

## 2014-09-15 ENCOUNTER — Encounter (HOSPITAL_COMMUNITY): Payer: Self-pay | Admitting: Licensed Clinical Social Worker

## 2014-09-15 ENCOUNTER — Ambulatory Visit (INDEPENDENT_AMBULATORY_CARE_PROVIDER_SITE_OTHER): Payer: 59 | Admitting: Licensed Clinical Social Worker

## 2014-09-15 VITALS — BP 138/91 | HR 92 | Wt 288.0 lb

## 2014-09-15 DIAGNOSIS — F331 Major depressive disorder, recurrent, moderate: Secondary | ICD-10-CM | POA: Diagnosis not present

## 2014-09-15 DIAGNOSIS — F328 Other depressive episodes: Secondary | ICD-10-CM

## 2014-09-15 DIAGNOSIS — G4700 Insomnia, unspecified: Secondary | ICD-10-CM | POA: Diagnosis not present

## 2014-09-15 DIAGNOSIS — F341 Dysthymic disorder: Secondary | ICD-10-CM

## 2014-09-15 DIAGNOSIS — Z720 Tobacco use: Secondary | ICD-10-CM | POA: Diagnosis not present

## 2014-09-15 MED ORDER — HYDROXYZINE HCL 25 MG PO TABS: ORAL_TABLET | ORAL | Status: DC

## 2014-09-15 MED ORDER — SERTRALINE HCL 50 MG PO TABS: 75.0000 mg | ORAL_TABLET | Freq: Every day | ORAL | Status: DC
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
## 2014-09-15 NOTE — Progress Notes (Signed)
[facility] MD Progress Note  09/15/2014 2:04 PM Maison Agrusa  MRN:  [id number] Subjective:  Kathryn Cline is in to follow up on her dysthymia and anxiety.Patient notes that she may be a little better, but still notes some moments of irritability. Notes decreased panic attacks, less frequent, less intense. Notes less sadness, mood swings are not as bad as they were, but are still present. She is not sleeping well, her appetite is decreased, she is only eating 1 time a day. She denies new problems or side effects to the medication. Principal Problem: Depression Diagnosis:   Patient Active Problem List   Diagnosis Date Noted  . Chronic low back pain [M54.5, G89.29] 08/23/2014  . Dehydration [E86.0] 06/26/2014  . Left-sided low back pain with left-sided sciatica [M54.42] 06/19/2014  . Severe obesity (BMI >= 40) [E66.01] 11/17/2013  . Depression [F32.9] 09/21/2013  . Osteoarthritis of both hips [M16.0] 08/25/2013  . Hypertriglyceridemia [E78.1] 08/24/2013  . Foot fracture, right [S92.901A] 08/22/2013  . Fibromyalgia [M79.7] 07/18/2013  . Neuropathy [G62.9] 07/18/2013  . Low back pain with radiation [M54.40] 07/18/2013  . Migraines [G43.909] 07/18/2013  . Atrophic vaginitis [N95.2] 02/26/2012    Past Medical History:  Past Medical History  Diagnosis Date  . Neuropathy   . Fibromyalgia   . GERD (gastroesophageal reflux disease)   . Allergy   . Hypertension     Past Surgical History  Procedure Laterality Date  . Appendectomy    . Abdominal hysterectomy    . Wrist surgery    . Left foot surgery    . Cyst removal trunk    . Cholecystectomy     Family History:  Family History  Problem Relation Age of Onset  . Heart attack Brother   . Hypertension Brother   . Heart attack Maternal Grandmother   . Hypertension Maternal Grandmother   . Cancer Maternal Grandfather   . Heart attack Maternal Grandfather   . Hypertension Maternal Grandfather   . Cancer Paternal Grandmother   . Heart attack  Paternal Grandmother   . Hypertension Paternal Grandmother   . Stroke Paternal Grandmother   . Heart attack Paternal Grandfather   . Hypertension Paternal Grandfather   . Hypertension Brother    Social History:  History  Alcohol Use No     History  Drug Use No    History   Social History  . Marital Status: Married    Spouse Name: N/A  . Number of Children: N/A  . Years of Education: N/A   Social History Main Topics  . Smoking status: Current Every Day Smoker  . Smokeless tobacco: Never Used  . Alcohol Use: No  . Drug Use: No  . Sexual Activity:    Partners: Male   Other Topics Concern  . None   Social History Narrative   Additional History:    Sleep: Poor  Appetite:  Poor   Assessment:   Musculoskeletal: Strength & Muscle Tone: within normal limits Gait & Station: normal Patient leans: normal   Psychiatric Specialty Exam: Physical Exam  ROS  Blood pressure 138/91, pulse 92, weight 288 lb (130.636 kg).Body mass index is 42.51 kg/(m^2).  General Appearance: Fairly Groomed  Engineer, water::  Good  Speech:  Clear and Coherent  Volume:  Normal  Mood:  Anxious and Depressed  Affect:  Congruent  Thought Process:  Coherent, Goal Directed, Intact, Linear and Logical  Orientation:  Full (Time, Place, and Person)  Thought Content:  WDL  Suicidal Thoughts:  No  Homicidal  Thoughts:  No  Memory:  Immediate;   Good Recent;   Good Remote;   Good  Judgement:  Intact  Insight:  Present  Psychomotor Activity:  Normal  Concentration:  Fair  Recall:  [place] of Knowledge:Good  Language: Good  Akathisia:  No  Handed:  Right  AIMS (if indicated):     Assets:  Communication Skills Desire for Improvement Financial Resources/Insurance [place] Talents/Skills Transportation Vocational/Educational  ADL's:  Intact  Cognition: WNL  Sleep:        Current Medications: Current Outpatient Prescriptions  Medication  Sig Dispense Refill  . celecoxib (CELEBREX) 200 MG capsule Take 1 capsule (200 mg total) by mouth 2 (two) times daily. 60 capsule 5  . cetirizine (ZYRTEC) 10 MG tablet Take 10 mg by mouth daily.    . cholecalciferol (VITAMIN D) 1000 UNITS tablet Take 3,000 Units by mouth daily.    . diclofenac sodium (VOLTAREN) 1 % GEL Apply 4 g topically 4 (four) times daily. 2 Tube 3  . furosemide (LASIX) 40 MG tablet TAKE ONE TABLET BY MOUTH ONCE DAILY 30 tablet 6  . HYDROcodone-acetaminophen (NORCO/VICODIN) 5-325 MG per tablet Take 1 tablet by mouth every 8 (eight) hours as needed for moderate pain. 90 tablet 0  . lisinopril (PRINIVIL,ZESTRIL) 20 MG tablet TAKE ONE TABLET BY MOUTH ONCE DAILY 30 tablet 0  . pantoprazole (PROTONIX) 40 MG tablet Take 1 tablet (40 mg total) by mouth 2 (two) times daily. 60 tablet 5  . promethazine (PHENERGAN) 25 MG tablet Take 1 tablet (25 mg total) by mouth every 6 (six) hours as needed for nausea or vomiting. 30 tablet 2  . rizatriptan (MAXALT) 5 MG tablet Take 5 mg by mouth as needed for migraine. May repeat in 2 hours if needed    . sertraline (ZOLOFT) 50 MG tablet Take 1/2 tablet daily for 7 days, then increase to 1 full tablet daily. 30 tablet 0  . topiramate (TOPAMAX) 50 MG tablet Take 50 mg by mouth daily.    . AMBULATORY NON FORMULARY MEDICATION Stress B-complex    . AMBULATORY NON FORMULARY MEDICATION Tens units   Dx: chronic pain, fibromyalgia 1 Device 0  . cephALEXin (KEFLEX) 500 MG capsule Take 1 capsule (500 mg total) by mouth 4 (four) times daily. (Patient not taking: Reported on 09/15/2014) 28 capsule 0  . ciprofloxacin (CIPRO) 500 MG tablet Take 1 tablet (500 mg total) by mouth 2 (two) times daily. (Patient not taking: Reported on 09/15/2014) 6 tablet 0  . clonazePAM (KLONOPIN) 0.5 MG tablet Take 0.5-1 tablets (0.25-0.5 mg total) by mouth daily as needed for anxiety. (Patient not taking: Reported on 09/15/2014) 15 tablet 0  . sertraline (ZOLOFT) 50 MG tablet Take  1/2 tablet daily for 7 days, then increase to 1 full tablet daily. (Patient not taking: Reported on 09/15/2014) 30 tablet 0   No current facility-administered medications for this visit.    Lab Results: No results found for this or any previous visit (from the past 48 hour(s)).  Physical Findings: AIMS:  CIWA:   COWS:    Treatment Plan Summary: Depression not yet treated to goal. Insomnia related to mental illness  Medical Decision Making:  Established Problem, Stable/Improving (1), Review of Psycho-Social Stressors (1) and New Problem, with no additional work-up planned (3)  Depression 1, Increase Zoloft to 75 mg po qd. 2. Continue Bcomplex and Vitamin D3  Insomnia 1. Initiate Hydroxyzine pamoate 76m at hs.  Follow up  3 weeks.   Vinicio Lynk 09/15/2014, 2:04 PM

## 2014-09-15 NOTE — Patient Instructions (Signed)
1. Take all of your medications as discussed with your provider. (Please check your AVS, for the list.) 2. Call this office for any questions or problems. 3. Be sure to get plenty of rest and try for 7-9 hours of quality sleep each night. 4. Try to get regular exercise, at least 15-30 minutes each day.  A good walk will help tremendously! 5. Remember to do your mindfulness each day, breath deeply in and out, while having quiet reflection, prayer, meditation, or positive visualization. Unplug and turn off all electronic devices each day for your own personal time without interruption. This works! There are studies to back this up! 6. Be sure to take your B complex and Vitamin D3 each day. This will improve your overall wellbeing and boost your immune system as well. 7. Try to eat a nutritious healthy diet and avoid excessive alcohol and ALL tobacco products. 8. Be sure to keep all of your appointments with your outpatient therapist. If you do not have one, our office will be happy to assist you with this. 9. Be sure to keep your next follow up appointment in 3 weeks.

## 2014-09-18 ENCOUNTER — Ambulatory Visit (INDEPENDENT_AMBULATORY_CARE_PROVIDER_SITE_OTHER): Payer: 59 | Admitting: Physical Therapy

## 2014-09-18 DIAGNOSIS — M545 Low back pain: Secondary | ICD-10-CM | POA: Diagnosis not present

## 2014-09-18 DIAGNOSIS — R29898 Other symptoms and signs involving the musculoskeletal system: Secondary | ICD-10-CM | POA: Diagnosis not present

## 2014-09-18 NOTE — Therapy (Signed)
[place], [place], [address] Phone: [phone]   Fax:  [phone]  Physical Therapy Treatment  Patient Details  Name: Kathryn Cline MRN: [id number] Date of Birth: Jan 19, 1976 Referring Provider:  Donella Stade, PA-C  Encounter Date: 09/18/2014      PT End of Session - 09/18/14 1153    Visit Number 12   Number of Visits 16   Date for PT Re-Evaluation 10/06/14   PT Start Time 1150   PT Stop Time 1257   PT Time Calculation (min) 67 min   Activity Tolerance Patient tolerated treatment well;Patient limited by pain      Past Medical History  Diagnosis Date  . Neuropathy   . Fibromyalgia   . GERD (gastroesophageal reflux disease)   . Allergy   . Hypertension     Past Surgical History  Procedure Laterality Date  . Appendectomy    . Abdominal hysterectomy    . Wrist surgery    . Left foot surgery    . Cyst removal trunk    . Cholecystectomy      There were no vitals filed for this visit.  Visit Diagnosis:  Midline low back pain, with sciatica presence unspecified  Weakness of both lower extremities      Subjective Assessment - 09/18/14 1154    Subjective Pt reports she had busy weekend. Noticed increased pain with long drive (45 min), prolonged sitting (1-2hrs), and sitting on soft couch for hour to watch movie. Pt reports she continues to use heat, TENS, and stretches to give pain relief.    Currently in Pain? Yes   Pain Score 7    Pain Location Back   Pain Orientation Left   Pain Descriptors / Indicators Aching   Pain Radiating Towards back to back of Lt knee when walking only.    Aggravating Factors  walking, prolonged sitting    Pain Relieving Factors TENs, heat, stretches.             [facility] PT Assessment - 09/18/14 0001    Assessment   Medical Diagnosis LBP                     [facility] Adult PT Treatment/Exercise - 09/18/14 0001    Lumbar Exercises:  Stretches   Lower Trunk Rotation  Piriformis stretch Cross body stretch  Hamstring stretch 6 reps, 5 sec hold each way  to each side X 30 sec each side  X 30 sec x 2 reps each leg.  X 30 sec each leg    Lumbar Exercises: Aerobic   Stationary Bike NuStep L4: 53mn  (arms and legs)   Lumbar Exercises: Supine   Ab Set 5 reps  10 seconds x 2 sets, req tactile cues   Clam 10 reps  with red band around thighs, green band x 10   Lumbar Exercises: Quadruped   Single Arm Raise 10 reps;Right;Left   Straight Leg Raise Opposite arm and leg  10 reps each leg  5 reps each side (challenging)    Other Quadruped Lumbar Exercises c-curve with spine (side to side)    Knee/Hip Exercises: Standing   Heel Raises Side to side lunge 20 reps with toe raises (UE support)  10 each direction, required some VC/demo for form   Knee/Hip Exercises: Supine   Other Supine Knee Exercises butterfly adductor stretch x 60 sec   pt reports this exercise gives relief in hip   Modalities  Modalities Traction;   Traction   Type of Traction Lumbar   Min (lbs) 38   Max (lbs) 75   Hold Time 60   Rest Time 20   Time 20                PT Education - 09/18/14 1231    Education provided Yes   Education Details HEP.  Added piriformis stretch    Person(s) Educated Patient   Methods Handout;Explanation   Comprehension Verbalized understanding          PT Short Term Goals - 09/01/14 1139    PT SHORT TERM GOAL #1   Title independent with HEP (09/04/14)   Status Achieved   PT SHORT TERM GOAL #2   Title improve ROM by 5 degrees of lumbar spine for improved mobility (09/04/14)   Status On-going   PT SHORT TERM GOAL #3   Title report pain < 5/10 for improved function (09/04/14)   Status Achieved   PT SHORT TERM GOAL #4   Title tolerate sitting/standing/walking at least 20 min each position without increase in pain for improved function (09/04/14)   Status On-going           PT Long Term Goals -  09/01/14 1139    PT LONG TERM GOAL #1   Title independent with advanced HEP (10/03/14)   Status On-going   PT LONG TERM GOAL #2   Title verbalize understanding of posture/body mechanics to reduce the risk of reinjury (10/03/14)   Status On-going   PT LONG TERM GOAL #3   Title lumbar ROM improved by 10 degrees all motions for improved function (10/03/14)   Status On-going   PT LONG TERM GOAL #4   Title tolerate walking > 30 min without increase in pain for improved function (10/03/14)   Status On-going               Plan - 09/18/14 1212    Clinical Impression Statement Pt reports that her overall pain has decreased after therapy to where she feels maybe she is "over doing it" outside therapy sessions. Pt tolerated increased reps of some ther exercises, decreased rest breaks- reporting decreased pain after ther ex and further decrease of pain with traction .     Pt will benefit from skilled therapeutic intervention in order to improve on the following deficits Decreased endurance;Decreased balance;Pain;Obesity;Decreased strength;Decreased mobility;Abnormal gait;Decreased range of motion;Impaired flexibility;Improper body mechanics;Postural dysfunction;Difficulty walking   Rehab Potential Good   PT Frequency 2x / week   PT Duration 8 weeks   PT Treatment/Interventions Patient/family education;Therapeutic exercise;Traction;Manual techniques   PT Next Visit Plan Continue core/ LE strengthening as tolerated.    Consulted and Agree with Plan of Care Patient        Problem List Patient Active Problem List   Diagnosis Date Noted  . Chronic low back pain 08/23/2014  . Dehydration 06/26/2014  . Left-sided low back pain with left-sided sciatica 06/19/2014  . Severe obesity (BMI >= 40) 11/17/2013  . Depression 09/21/2013  . Osteoarthritis of both hips 08/25/2013  . Hypertriglyceridemia 08/24/2013  . Foot fracture, right 08/22/2013  . Fibromyalgia 07/18/2013  . Neuropathy 07/18/2013   . Low back pain with radiation 07/18/2013  . Migraines 07/18/2013  . Atrophic vaginitis 02/26/2012   Kerin Perna, PTA 09/18/2014 12:44 PM  [place], [place], [address] Phone: [phone]   Fax:  [phone]

## 2014-09-18 NOTE — Patient Instructions (Signed)
Piriformis (Supine)  Cross legs, right on top. Gently pull other knee toward chest until stretch is felt in buttock/hip of top leg. Hold _30___ seconds. Repeat _2___ times per set. Do _1___ sets per session. Do _2___ sessions per day.  Hip Stretch  Put right ankle over left knee. Let right knee fall downward, but keep ankle in place. Feel the stretch in hip. May push down gently with hand to feel stretch. Hold __30__ seconds while counting out loud. Repeat with other leg. Repeat _2___ times. Do __2__ sessions per day.  [facility] [place], [place] [address]  [phone] (office) [phone] (fax)

## 2014-09-22 ENCOUNTER — Ambulatory Visit (INDEPENDENT_AMBULATORY_CARE_PROVIDER_SITE_OTHER): Payer: 59 | Admitting: Physical Therapy

## 2014-09-22 ENCOUNTER — Ambulatory Visit (INDEPENDENT_AMBULATORY_CARE_PROVIDER_SITE_OTHER): Payer: 59 | Admitting: Licensed Clinical Social Worker

## 2014-09-22 DIAGNOSIS — M545 Low back pain: Secondary | ICD-10-CM | POA: Diagnosis not present

## 2014-09-22 DIAGNOSIS — F341 Dysthymic disorder: Secondary | ICD-10-CM

## 2014-09-22 DIAGNOSIS — R29898 Other symptoms and signs involving the musculoskeletal system: Secondary | ICD-10-CM

## 2014-09-22 NOTE — Therapy (Signed)
[place], [place], [address] Phone: [phone]   Fax:  [phone]  Physical Therapy Treatment  Patient Details  Name: Kathryn Cline MRN: [id number] Date of Birth: [birth year] Referring Provider:  Donella Stade, PA-C  Encounter Date: 09/22/2014      PT End of Session - 09/22/14 1151    Visit Number 13   Number of Visits 16   Date for PT Re-Evaluation 10/06/14   PT Start Time 1324   PT Stop Time 4010   PT Time Calculation (min) 70 min   Activity Tolerance Patient tolerated treatment well;No increased pain      Past Medical History  Diagnosis Date  . Neuropathy   . Fibromyalgia   . GERD (gastroesophageal reflux disease)   . Allergy   . Hypertension     Past Surgical History  Procedure Laterality Date  . Appendectomy    . Abdominal hysterectomy    . Wrist surgery    . Left foot surgery    . Cyst removal trunk    . Cholecystectomy      There were no vitals filed for this visit.  Visit Diagnosis:  Midline low back pain, with sciatica presence unspecified  Weakness of both lower extremities      Subjective Assessment - 09/22/14 1151    Subjective Pt reports her Lt side, (lower back/hip) feels like a big knot.  "May have over did it, went to gym -20 min total- after last therapy session". Michela Pitcher could barely get out of bed yesterday.    Pain Score 6    Pain Location Back   Pain Orientation Left   Pain Descriptors / Indicators Aching   Pain Score 6   Pain Location Hip   Pain Orientation Left   Pain Descriptors / Indicators Burning;Nagging            [facility] PT Assessment - 09/22/14 0001    Assessment   Medical Diagnosis LBP   Next MD Visit PRN                     [facility] Adult PT Treatment/Exercise - 09/22/14 0001    Lumbar Exercises: Seated   Other Seated Lumbar Exercises Seated marching with trans abd engaged x 20.    Other Seated Lumbar Exercises  Seated hamstring stretch 30 sec x 2 reps each side. Seated piriformis stretch x 30 sec x 2    Knee/Hip Exercises: Stretches   Gastroc Stretch 20 seconds;2 reps   Soleus Stretch 20 seconds: 2 reps   Knee/Hip Exercises: Standing   Heel Raises 15 reps   Side Lunges 1 set;10 reps   Functional Squat 10 reps, x 2 sets. VC and demo for proper form.    SLS up to 10 sec Lt and Rt x 4 trials each leg    Aerobic :  Nu Step L3 (arms and legs) x 5 min SPM (60-70)       Other Standing Knee Exercises Side stepping with yellow band at ankle x 5 ft each direction x 3 sets with UE support for balance.    Knee/Hip Exercises: Seated   Long Arc Quad Right;Left;2 sets;10 reps   Long Arc Quad Weight 5 lbs.   Other Seated Knee Exercises Seated marching with 5# on ankles x 20 steps.    Other Seated Knee Exercises Seated cat/camel stretch x 5 reps each direction .    Modalities   Modalities Traction;Electrical Stimulation  Acupuncturist Location Lt piriformis and Bilat low back paraspinals   Electrical Stimulation Action pre-mod to each area   Electrical Stimulation Parameters to tolerance, x 20 min    Electrical Stimulation Goals Pain   Traction   Type of Traction Lumbar   Min (lbs) 38   Max (lbs) 75   Hold Time 06   Rest Time 20   Time 20                  PT Short Term Goals - 09/01/14 1139    PT SHORT TERM GOAL #1   Title independent with HEP (09/04/14)   Status Achieved   PT SHORT TERM GOAL #2   Title improve ROM by 5 degrees of lumbar spine for improved mobility (09/04/14)   Status On-going   PT SHORT TERM GOAL #3   Title report pain < 5/10 for improved function (09/04/14)   Status Achieved   PT SHORT TERM GOAL #4   Title tolerate sitting/standing/walking at least 20 min each position without increase in pain for improved function (09/04/14)   Status On-going           PT Long Term Goals - 09/22/14 1243    PT LONG TERM GOAL #1   Title independent  with advanced HEP (10/03/14)   Time 8   Period Weeks   Status On-going   PT LONG TERM GOAL #2   Title verbalize understanding of posture/body mechanics to reduce the risk of reinjury (10/03/14)   Time 4   Period Weeks   Status On-going   PT LONG TERM GOAL #3   Title lumbar ROM improved by 10 degrees all motions for improved function (10/03/14)   Time 8   Period Weeks   Status On-going   PT LONG TERM GOAL #4   Title tolerate walking > 30 min without increase in pain for improved function (10/03/14)   Time 8   Period Weeks   Status Partially Met  can walk 15 min - reported 09/22/14   PT LONG TERM GOAL #5   Title report pain < 3/10 for improved pain (10/03/14)   Time 8   Period Weeks   Status On-going               Plan - 09/22/14 1311    Clinical Impression Statement Pt demonstrated increased activity tolerance; able to perform exercise without need for rest breaks in between activities. Pt reported resolution of pain at end of session, "only soreness now"    Pt will benefit from skilled therapeutic intervention in order to improve on the following deficits Decreased endurance;Decreased balance;Pain;Obesity;Decreased strength;Decreased mobility;Abnormal gait;Decreased range of motion;Impaired flexibility;Improper body mechanics;Postural dysfunction;Difficulty walking   Rehab Potential Good   PT Frequency 2x / week   PT Duration 8 weeks   PT Treatment/Interventions Patient/family education;Therapeutic exercise;Traction;Manual techniques   PT Next Visit Plan Continue core/ LE strengthening as tolerated.    Consulted and Agree with Plan of Care Patient        Problem List Patient Active Problem List   Diagnosis Date Noted  . Chronic low back pain 08/23/2014  . Dehydration 06/26/2014  . Left-sided low back pain with left-sided sciatica 06/19/2014  . Severe obesity (BMI >= 40) 11/17/2013  . Depression 09/21/2013  . Osteoarthritis of both hips 08/25/2013  .  Hypertriglyceridemia 08/24/2013  . Foot fracture, right 08/22/2013  . Fibromyalgia 07/18/2013  . Neuropathy 07/18/2013  . Low back pain with radiation  07/18/2013  . Migraines 07/18/2013  . Atrophic vaginitis 02/26/2012    Kerin Perna, PTA 09/22/2014 1:18 PM  [facility] [place], [place], [address] Phone: [phone]   Fax:  [phone]

## 2014-09-22 NOTE — Psych (Signed)
   THERAPIST PROGRESS NOTE  Session Time: 3:00pm-4:00pm  Participation Level: Active  Behavioral Response: DisheveledAlert Depressed and tearful  Type of Therapy: Individual Therapy  Treatment Goals addressed: Depression and anxiety  Interventions: Supportive and Meditation: focused breathing  Summary: Described how she has experienced ongoing painful feelings related to losses of important people in her life.  She said, "I try not to deal with it because it is too hard."  Acknowledged that she doesn't know how to deal with it effectively.  Cooperative about practicing the focused breathing.  Indicated this was something new for her.  Expressed intentions of practicing the skill.      Suicidal/Homicidal: Denied both  Therapist Response: Discussed several losses patient has had in her life and how they have affected her.  Talked about why it is typically not effective to prevent yourself from feeling painful feelings.  Introduced patient to a skill called focused breathing.  Had her listen to a recording.  Explained how the exercise can be helpful to practice at times when you feel overwhelmed.  Plan: Will focus on psychoeducation about diagnoses at next session in a week.  Diagnosis: Persistent Depressive Disorder with anxious distress       Armandina Stammer 09/22/2014

## 2014-09-27 ENCOUNTER — Encounter: Payer: Self-pay | Admitting: Rehabilitative and Restorative Service Providers"

## 2014-09-29 ENCOUNTER — Ambulatory Visit (INDEPENDENT_AMBULATORY_CARE_PROVIDER_SITE_OTHER): Payer: 59 | Admitting: Physical Therapy

## 2014-09-29 ENCOUNTER — Ambulatory Visit (INDEPENDENT_AMBULATORY_CARE_PROVIDER_SITE_OTHER): Payer: 59 | Admitting: Licensed Clinical Social Worker

## 2014-09-29 DIAGNOSIS — R29898 Other symptoms and signs involving the musculoskeletal system: Secondary | ICD-10-CM | POA: Diagnosis not present

## 2014-09-29 DIAGNOSIS — F341 Dysthymic disorder: Secondary | ICD-10-CM

## 2014-09-29 DIAGNOSIS — M545 Low back pain: Secondary | ICD-10-CM

## 2014-09-29 NOTE — Progress Notes (Signed)
THERAPIST PROGRESS NOTE  Session Time: 1:00pm-2:00pm  Participation Level: Active  Behavioral Response: Disheveled Alert      Blunted Affect  Type of Therapy: Individual Therapy  Treatment Goals addressed: Depression and anxiety  Interventions: Psycho-education and behavioral activation  Therapist Interventions: Discussed how patient coped with a particularly stressful situation in the past week. Educated patient about the symptoms of depression.  Discussed potential causes.  Talked about how staying active is important for recovering from depression.  Encouraged patient to choose goals for staying active based on the severity of her symptoms.    Summary:  Reported that her cat went missing earlier this week.  She took action searching the area, going to animal shelters, and even posted on a lost pet website.  Relieved last night when the cat returned.   Indicated she was not familiar with the symptoms of depression.   Expressed a belief that her depression is caused at least in part to losses of significant people in her life and having issues with chronic pain.  Receptive to suggestions to increase activity level.    Reported she has been practicing focused breathing and it has been helpful.        Suicidal/Homicidal: Denied both    Plan:   Scheduled to return in approximately 2 weeks.    Diagnosis:Persistent Depressive Disorder with anxious distress      Garnette Scheuermann, LCSW

## 2014-09-29 NOTE — Therapy (Signed)
[facility]  [place], [place], [address] Phone: [phone]   Fax:  [phone]  Physical Therapy Treatment  Patient Details  Name: Kathryn Cline MRN: [id number] Date of Birth: [birth year] Referring Provider:  Donella Stade, PA-C  Encounter Date: 09/29/2014      PT End of Session - 09/29/14 1412    Visit Number 14   Number of Visits 16   Date for PT Re-Evaluation 10/06/14   PT Start Time 1410   PT Stop Time 1506   PT Time Calculation (min) 56 min      Past Medical History  Diagnosis Date  . Neuropathy   . Fibromyalgia   . GERD (gastroesophageal reflux disease)   . Allergy   . Hypertension     Past Surgical History  Procedure Laterality Date  . Appendectomy    . Abdominal hysterectomy    . Wrist surgery    . Left foot surgery    . Cyst removal trunk    . Cholecystectomy      There were no vitals filed for this visit.  Visit Diagnosis:  Midline low back pain, with sciatica presence unspecified  Weakness of both lower extremities      Subjective Assessment - 09/29/14 1421    Subjective Pt reports she has "done some walking" (time/distance unknown).  Has been doing lots of stretching, providing some relief. Plans to buy SI belt for relief. Pt reports, "I feel so good when I leave here that I think I sometimes over do it later"    Currently in Pain? Yes   Pain Score 4    Pain Location Sacrum   Pain Orientation Left   Pain Descriptors / Indicators Burning;Aching   Pain Radiating Towards (localized to SI joint)   Aggravating Factors  prolonged sitting    Pain Relieving Factors TENS heat, ice, stretches.             [facility] PT Assessment - 09/29/14 0001    Assessment   Medical Diagnosis LBP   Next MD Visit PRN             [facility] Adult PT Treatment/Exercise - 09/29/14 0001    Lumbar Exercises: Aerobic   Stationary Bike NuStep L3:10 min    Lumbar Exercises: Standing   Heel  Raises 20 reps   Lumbar Exercises: Quadruped   Madcat/Old Horse 10 reps   Knee/Hip Exercises: Stretches   Gastroc Stretch 20 seconds;2 reps  each leg    Soleus Stretch 2 reps;20 seconds   Knee/Hip Exercises: Standing   Heel Raises 20 reps   Side Lunges 1 set;10 reps  each direction    Functional Squat 10 reps   SLS up to 20 sec each leg after 4 trials.    Other Standing Knee Exercises Side stepping with yellow band at ankle x  8 ft each direction x 3 sets.    Modalities   Modalities Traction   Traction   Type of Traction Lumbar   Min (lbs) 40   Max (lbs) 80   Hold Time 60   Rest Time 20   Time 20                  PT Short Term Goals - 09/01/14 1139    PT SHORT TERM GOAL #1   Title independent with HEP (09/04/14)   Status Achieved   PT SHORT TERM GOAL #2   Title improve ROM by 5 degrees of lumbar  spine for improved mobility (09/04/14)   Status On-going   PT SHORT TERM GOAL #3   Title report pain < 5/10 for improved function (09/04/14)   Status Achieved   PT SHORT TERM GOAL #4   Title tolerate sitting/standing/walking at least 20 min each position without increase in pain for improved function (09/04/14)   Status On-going           PT Long Term Goals - 09/22/14 1243    PT LONG TERM GOAL #1   Title independent with advanced HEP (10/03/14)   Time 8   Period Weeks   Status On-going   PT LONG TERM GOAL #2   Title verbalize understanding of posture/body mechanics to reduce the risk of reinjury (10/03/14)   Time 4   Period Weeks   Status On-going   PT LONG TERM GOAL #3   Title lumbar ROM improved by 10 degrees all motions for improved function (10/03/14)   Time 8   Period Weeks   Status On-going   PT LONG TERM GOAL #4   Title tolerate walking > 30 min without increase in pain for improved function (10/03/14)   Time 8   Period Weeks   Status Partially Met  can walk 15 min - reported 09/22/14   PT LONG TERM GOAL #5   Title report pain < 3/10 for improved pain  (10/03/14)   Time 8   Period Weeks   Status On-going               Plan - 09/29/14 1452    Clinical Impression Statement Pt reporting overall reduction in pain; taking less pain medicine at night as well.  Pt tolerated exercises without increased pain.     Pt will benefit from skilled therapeutic intervention in order to improve on the following deficits Decreased endurance;Decreased balance;Pain;Obesity;Decreased strength;Decreased mobility;Abnormal gait;Decreased range of motion;Impaired flexibility;Improper body mechanics;Postural dysfunction;Difficulty walking   Rehab Potential Good   PT Frequency 2x / week   PT Duration 8 weeks   PT Treatment/Interventions Patient/family education;Therapeutic exercise;Traction;Manual techniques   PT Next Visit Plan Continue core/ LE strengthening as tolerated.    Consulted and Agree with Plan of Care Patient        Problem List Patient Active Problem List   Diagnosis Date Noted  . Chronic low back pain 08/23/2014  . Dehydration 06/26/2014  . Left-sided low back pain with left-sided sciatica 06/19/2014  . Severe obesity (BMI >= 40) 11/17/2013  . Depression 09/21/2013  . Osteoarthritis of both hips 08/25/2013  . Hypertriglyceridemia 08/24/2013  . Foot fracture, right 08/22/2013  . Fibromyalgia 07/18/2013  . Neuropathy 07/18/2013  . Low back pain with radiation 07/18/2013  . Migraines 07/18/2013  . Atrophic vaginitis 02/26/2012    Kerin Perna, PTA 09/29/2014 4:04 PM  [facility] [place], [place], [address] Phone: [phone]   Fax:  [phone]

## 2014-10-04 ENCOUNTER — Ambulatory Visit (INDEPENDENT_AMBULATORY_CARE_PROVIDER_SITE_OTHER): Payer: 59 | Admitting: Physical Therapy

## 2014-10-04 DIAGNOSIS — M545 Low back pain: Secondary | ICD-10-CM | POA: Diagnosis not present

## 2014-10-04 DIAGNOSIS — R29898 Other symptoms and signs involving the musculoskeletal system: Secondary | ICD-10-CM

## 2014-10-04 NOTE — Therapy (Signed)
[facility] [address] [place], [place], [address] Phone: [phone]   Fax:  [phone]  Physical Therapy Treatment  Patient Details  Name: Kathryn Cline MRN: [id number] Date of Birth: 01/11/1976 Referring Provider:  [name], [name], PA-C  Encounter Date: 10/04/2014      PT End of Session - 10/04/14 1120    Visit Number 15   Number of Visits 16   Date for PT Re-Evaluation 10/06/14   PT Start Time 1102   PT Stop Time 1201   PT Time Calculation (min) 59 min   Activity Tolerance Patient tolerated treatment well      Past Medical History  Diagnosis Date  . Neuropathy   . Fibromyalgia   . GERD (gastroesophageal reflux disease)   . Allergy   . Hypertension     Past Surgical History  Procedure Laterality Date  . Appendectomy    . Abdominal hysterectomy    . Wrist surgery    . Left foot surgery    . Cyst removal trunk    . Cholecystectomy      There were no vitals filed for this visit.  Visit Diagnosis:  Midline low back pain, with sciatica presence unspecified  Weakness of both lower extremities      Subjective Assessment - 10/04/14 1106    Subjective Pt reports she did some housecleaning, "I may have over done it".     Currently in Pain? Yes   Pain Score 5    Pain Location Sacrum   Pain Orientation Left   Pain Descriptors / Indicators Burning;Aching   Pain Radiating Towards (localized to SI joint)    Aggravating Factors  Prolonged sitting    Pain Relieving Factors TENS, heat, ice, stretches.             [facility] PT Assessment - 10/04/14 0001    Assessment   Medical Diagnosis LBP   Next MD Visit PRN   AROM   Lumbar Flexion 30   Lumbar Extension 25   Lumbar - Right Side Bend 40   Lumbar - Left Side Bend 40   Strength   Right Hip Flexion 4/5   Right Hip Extension 5/5   Right Hip ABduction --  5-/5   Left Hip Flexion 4/5   Left Hip Extension 5/5   Left Hip ABduction --  5-/5    Right Knee Flexion 4+/5   Left Knee Flexion 4/5                     [facility] Adult PT Treatment/Exercise - 10/04/14 0001    Lumbar Exercises: Aerobic   Stationary Bike NuStep: L5 x 6 min (arms and legs    Lumbar Exercises: Supine   Straight Leg Raise 10 reps  each leg   Lumbar Exercises: Sidelying   Clam 10 reps  each leg    Hip Abduction 10 reps  each leg    Lumbar Exercises: Prone   Straight Leg Raise 10 reps  each side   Other Prone Lumbar Exercises Hip ext with knee flex x 10 reps each leg    Other Prone Lumbar Exercises prone press ups x 5 slow reps    Cryotherapy   Number Minutes Cryotherapy 15 Minutes   Cryotherapy Location Lumbar Spine   Type of Cryotherapy Ice pack   Electrical Stimulation   Electrical Stimulation Location bilat piriformis and LB paraspinals    Electrical Stimulation Action IFC, 15 min    Electrical   Stimulation Parameters to tolerance    Electrical Stimulation Goals Pain   Manual Therapy   Manual Therapy Joint mobilization;Myofascial release   Joint Mobilization sacral mobs grade III  mobs bilat SI joint areas/sacrum/lower lumbar vertebrae   Other Manual Therapy manualrelease work to Lt piriformis/hip musculature     Joint mobilization preformed by Celyn P. Holt, PT, MPH       PT Education - 10/04/14 1209    Education provided Yes   Education Details Increased side stepping resistance to red band- band issued.    Person(s) Educated Patient   Methods Explanation   Comprehension Verbalized understanding          PT Short Term Goals - 09/01/14 1139    PT SHORT TERM GOAL #1   Title independent with HEP (09/04/14)   Status Achieved   PT SHORT TERM GOAL #2   Title improve ROM by 5 degrees of lumbar spine for improved mobility (09/04/14)   Status On-going   PT SHORT TERM GOAL #3   Title report pain < 5/10 for improved function (09/04/14)   Status Achieved   PT SHORT TERM GOAL #4   Title tolerate sitting/standing/walking at least 20  min each position without increase in pain for improved function (09/04/14)   Status On-going           PT Long Term Goals - 09/22/14 1243    PT LONG TERM GOAL #1   Title independent with advanced HEP (10/03/14)   Time 8   Period Weeks   Status On-going   PT LONG TERM GOAL #2   Title verbalize understanding of posture/body mechanics to reduce the risk of reinjury (10/03/14)   Time 4   Period Weeks   Status On-going   PT LONG TERM GOAL #3   Title lumbar ROM improved by 10 degrees all motions for improved function (10/03/14)   Time 8   Period Weeks   Status On-going   PT LONG TERM GOAL #4   Title tolerate walking > 30 min without increase in pain for improved function (10/03/14)   Time 8   Period Weeks   Status Partially Met  can walk 15 min - reported 09/22/14   PT LONG TERM GOAL #5   Title report pain < 3/10 for improved pain (10/03/14)   Time 8   Period Weeks   Status On-going               Plan - 10/04/14 1136    Clinical Impression Statement Pt demostrated improved lumbar ROM and hip strength this visit.  Pt tolerated all exercises with some mild reported pain with Lt hip extension. Pt noted some soreness, with decreased stiffness after joint mobilization. Has met STG #2, LTG #3.     Pt will benefit from skilled therapeutic intervention in order to improve on the following deficits Decreased endurance;Decreased balance;Pain;Obesity;Decreased strength;Decreased mobility;Abnormal gait;Decreased range of motion;Impaired flexibility;Improper body mechanics;Postural dysfunction;Difficulty walking   Rehab Potential Good   PT Frequency 2x / week   PT Duration 8 weeks   PT Treatment/Interventions Patient/family education;Therapeutic exercise;Traction;Manual techniques   PT Next Visit Plan Include education on body mechanics for lifting/chores. Assess response to treatment without traction. Assess need for continuation of therapy vs. d/c.    Consulted and Agree with Plan of  Care Patient        Problem List Patient Active Problem List   Diagnosis Date Noted  . Chronic low back pain 08/23/2014  . Dehydration 06/26/2014  .   Left-sided low back pain with left-sided sciatica 06/19/2014  . Severe obesity (BMI >= 40) 11/17/2013  . Depression 09/21/2013  . Osteoarthritis of both hips 08/25/2013  . Hypertriglyceridemia 08/24/2013  . Foot fracture, right 08/22/2013  . Fibromyalgia 07/18/2013  . Neuropathy 07/18/2013  . Low back pain with radiation 07/18/2013  . Migraines 07/18/2013  . Atrophic vaginitis 02/26/2012   Jennifer Carlson-Long, PTA 10/04/2014 12:10 PM  [facility] [address] [place], [place], [address] Phone: [phone]   Fax:  [phone]      

## 2014-10-06 ENCOUNTER — Encounter: Payer: Self-pay | Admitting: Physical Therapy

## 2014-10-09 ENCOUNTER — Ambulatory Visit (INDEPENDENT_AMBULATORY_CARE_PROVIDER_SITE_OTHER): Payer: 59 | Admitting: Physical Therapy

## 2014-10-09 DIAGNOSIS — M545 Low back pain: Secondary | ICD-10-CM | POA: Diagnosis not present

## 2014-10-09 DIAGNOSIS — R29898 Other symptoms and signs involving the musculoskeletal system: Secondary | ICD-10-CM | POA: Diagnosis not present

## 2014-10-09 NOTE — Therapy (Signed)
[place], [place], [address] Phone: [phone]   Fax:  [phone]  Physical Therapy Treatment  Patient Details  Name: Kathryn Cline MRN: [id number] Date of Birth: Jun 28, 1975 Referring Provider:  Donella Stade, PA-C  Encounter Date: 10/09/2014      PT End of Session - 10/09/14 1159    Visit Number 16   Number of Visits 28   Date for PT Re-Evaluation 11/17/14   PT Start Time 1155   PT Stop Time 1250   PT Time Calculation (min) 55 min   Activity Tolerance Patient limited by fatigue;Patient limited by pain      Past Medical History  Diagnosis Date  . Neuropathy   . Fibromyalgia   . GERD (gastroesophageal reflux disease)   . Allergy   . Hypertension     Past Surgical History  Procedure Laterality Date  . Appendectomy    . Abdominal hysterectomy    . Wrist surgery    . Left foot surgery    . Cyst removal trunk    . Cholecystectomy      There were no vitals filed for this visit.  Visit Diagnosis:  Midline low back pain, with sciatica presence unspecified  Weakness of both lower extremities      Subjective Assessment - 10/09/14 1200    Subjective Pt reports she felt good from last session (Wed) through Friday night. Went to concert and feels it was too much; stayed in bed next two day. Has been experiencing leg cramps.    Currently in Pain? Yes   Pain Score 7    Pain Location Back   Pain Orientation Right;Left   Pain Radiating Towards from sacrum to toes.             [facility] PT Assessment - 10/09/14 0001    Assessment   Medical Diagnosis LBP   Next MD Visit PRN   Observation/Other Assessments   Focus on Therapeutic Outcomes (FOTO)  72% limited. (scored 65% limited on 09/08/14)                     [facility] PT Treatment/Exercise - 10/09/14 0001    Lumbar Exercises: Aerobic   Stationary Bike NuStep L3: 7 min    Lumbar Exercises: Standing   Heel Raises  20 reps   Side Lunge --  2 reps each way, poor form.    Other Standing Lumbar Exercises side stepping with red band at ankles, x 5 ft x 4 reps each way; marching with red band at ankles x 20 steps ( in place)    Lumbar Exercises: Prone   Other Prone Lumbar Exercises prone press ups x 5 slow reps    Lumbar Exercises: Quadruped   Madcat/Old Horse 5 reps   Knee/Hip Exercises: Stretches   Gastroc Stretch 1 rep;20 seconds   Knee/Hip Exercises: Standing   Functional Squat 10 reps;5 reps   Traction   Type of Traction Lumbar   Min (lbs) 45   Max (lbs) 85   Hold Time 60   Rest Time 20   Time 20                PT Education - 10/09/14 1309    Education provided Yes   Education Details self care: pt issued and educated on posture and body mechanics for ADLs and chores.    Person(s) Educated Patient   Methods Handout;Explanation   Comprehension Verbalized understanding  PT Short Term Goals - 10/09/14 1244    PT SHORT TERM GOAL #1   Title independent with HEP (09/04/14)   Time 4   Period Weeks   Status Achieved   PT SHORT TERM GOAL #2   Title improve ROM by 5 degrees of lumbar spine for improved mobility (09/04/14)   Time 4   Period Weeks   Status Achieved   PT SHORT TERM GOAL #3   Title report pain < 5/10 for improved function (09/04/14)   Time 4   Period Weeks   Status Achieved   PT SHORT TERM GOAL #4   Title tolerate sitting/standing/walking at least 20 min each position without increase in pain for improved function (09/04/14)   Time 4   Period Weeks   Status On-going           PT Long Term Goals - 10/09/14 1240    PT LONG TERM GOAL #1   Title independent with advanced HEP (10/03/14)   Time 8   Period Weeks   Status On-going   PT LONG TERM GOAL #2   Title verbalize understanding of posture/body mechanics to reduce the risk of reinjury (10/03/14)   Time 4   Period Weeks   Status Achieved   PT LONG TERM GOAL #3   Title lumbar ROM improved by 10  degrees all motions for improved function (10/03/14)   Time 8   Period Weeks   Status Achieved   PT LONG TERM GOAL #4   Title tolerate walking > 30 min without increase in pain for improved function (10/03/14)   Time 8   Period Weeks   Status Partially Met  reported can walk 15-20 min (09/22/14)   PT LONG TERM GOAL #5   Title report pain < 3/10 for improved pain (10/03/14)   Time 8   Period Weeks   Status On-going               Plan - 10/09/14 1242    Clinical Impression Statement Pt had been making great progress towards all goals with improved lumbar ROM and activity tolerance, decreased pain. Pt has since last visit had a set back, reporting increased pain radiating down to toes and cramping of legs.  Pt demo difficulty tolerating ther ex and was unable to tolerate MMT due to pain. Pt reports she is interested in continuation of therapy to decrease pain and improve functional mobility.   Pt will benefit from skilled therapeutic intervention in order to improve on the following deficits Decreased endurance;Decreased balance;Pain;Obesity;Decreased strength;Decreased mobility;Abnormal gait;Decreased range of motion;Impaired flexibility;Improper body mechanics;Postural dysfunction;Difficulty walking   Rehab Potential Good   PT Treatment/Interventions Patient/family education;Therapeutic exercise;Traction;Manual techniques   PT Next Visit Plan spoke to supervising PT regarding pt's progress and her interest in continuation of therapy.    Consulted and Agree with Plan of Care Patient        Problem List Patient Active Problem List   Diagnosis Date Noted  . Chronic low back pain 08/23/2014  . Dehydration 06/26/2014  . Left-sided low back pain with left-sided sciatica 06/19/2014  . Severe obesity (BMI >= 40) 11/17/2013  . Depression 09/21/2013  . Osteoarthritis of both hips 08/25/2013  . Hypertriglyceridemia 08/24/2013  . Foot fracture, right 08/22/2013  . Fibromyalgia  07/18/2013  . Neuropathy 07/18/2013  . Low back pain with radiation 07/18/2013  . Migraines 07/18/2013  . Atrophic vaginitis 02/26/2012   Kerin Perna, PTA 10/09/2014 1:11 PM  [facility]  [place], [place], [address] Phone: [phone]   Fax:  [phone]

## 2014-10-09 NOTE — Patient Instructions (Signed)

## 2014-10-09 NOTE — Therapy (Signed)
[place], [place], [address] Phone: [phone]   Fax:  [phone]  Physical Therapy Treatment  Patient Details  Name: Kathryn Cline MRN: [id number] Date of Birth: 27-Mar-1976 Referring Provider:  Donella Stade, PA-C  Encounter Date: 10/09/2014      PT End of Session - 10/09/14 1159    Visit Number 16   Number of Visits 28   Date for PT Re-Evaluation 11/17/14   PT Start Time 1155   PT Stop Time 1250   PT Time Calculation (min) 55 min   Activity Tolerance Patient limited by fatigue;Patient limited by pain      Past Medical History  Diagnosis Date  . Neuropathy   . Fibromyalgia   . GERD (gastroesophageal reflux disease)   . Allergy   . Hypertension     Past Surgical History  Procedure Laterality Date  . Appendectomy    . Abdominal hysterectomy    . Wrist surgery    . Left foot surgery    . Cyst removal trunk    . Cholecystectomy      There were no vitals filed for this visit.  Visit Diagnosis:  Midline low back pain, with sciatica presence unspecified - Plan: PT plan of care cert/re-cert  Weakness of both lower extremities - Plan: PT plan of care cert/re-cert      Subjective Assessment - 10/09/14 1200    Subjective Pt reports she felt good from last session (Wed) through Friday night. Went to concert and feels it was too much; stayed in bed next two day. Has been experiencing leg cramps.    Currently in Pain? Yes   Pain Score 7    Pain Location Back   Pain Orientation Right;Left   Pain Radiating Towards from sacrum to toes.             [facility] PT Assessment - 10/09/14 0001    Assessment   Medical Diagnosis LBP   Next MD Visit PRN   Observation/Other Assessments   Focus on Therapeutic Outcomes (FOTO)  72% limited. (scored 65% limited on 09/08/14)                     [place] Adult PT Treatment/Exercise - 10/09/14 0001    Lumbar Exercises: Aerobic   Stationary Bike NuStep L3: 7 min    Lumbar Exercises: Standing   Heel Raises 20 reps   Side Lunge --  2 reps each way, poor form.    Other Standing Lumbar Exercises side stepping with red band at ankles, x 5 ft x 4 reps each way; marching with red band at ankles x 20 steps ( in place)    Lumbar Exercises: Prone   Other Prone Lumbar Exercises prone press ups x 5 slow reps    Lumbar Exercises: Quadruped   Madcat/Old Horse 5 reps   Knee/Hip Exercises: Stretches   Gastroc Stretch 1 rep;20 seconds   Knee/Hip Exercises: Standing   Functional Squat 10 reps;5 reps   Traction   Type of Traction Lumbar   Min (lbs) 45   Max (lbs) 85   Hold Time 60   Rest Time 20   Time 20                PT Education - 10/09/14 1309    Education provided Yes   Education Details self care: pt issued and educated on posture and body mechanics for ADLs and chores.    Person(s)  Educated Patient   Methods Handout;Explanation   Comprehension Verbalized understanding          PT Short Term Goals - 10/09/14 1244    PT SHORT TERM GOAL #1   Title independent with HEP (09/04/14)   Time 4   Period Weeks   Status Achieved   PT SHORT TERM GOAL #2   Title improve ROM by 5 degrees of lumbar spine for improved mobility (09/04/14)   Time 4   Period Weeks   Status Achieved   PT SHORT TERM GOAL #3   Title report pain < 5/10 for improved function (09/04/14)   Time 4   Period Weeks   Status Achieved   PT SHORT TERM GOAL #4   Title tolerate sitting/standing/walking at least 20 min each position without increase in pain for improved function (09/04/14)   Time 4   Period Weeks   Status On-going           PT Long Term Goals - 10/09/14 1240    PT LONG TERM GOAL #1   Title independent with advanced HEP (10/03/14)   Time 8   Period Weeks   Status On-going   PT LONG TERM GOAL #2   Title verbalize understanding of posture/body mechanics to reduce the risk of reinjury (10/03/14)   Time 4   Period Weeks    Status Achieved   PT LONG TERM GOAL #3   Title lumbar ROM improved by 10 degrees all motions for improved function (10/03/14)   Time 8   Period Weeks   Status Achieved   PT LONG TERM GOAL #4   Title tolerate walking > 30 min without increase in pain for improved function (10/03/14)   Time 8   Period Weeks   Status Partially Met  reported can walk 15-20 min (09/22/14)   PT LONG TERM GOAL #5   Title report pain < 3/10 for improved pain (10/03/14)   Time 8   Period Weeks   Status On-going               Plan - 10/09/14 1242    Clinical Impression Statement Pt had been making great progress towards all goals with improved lumbar ROM and activity tolerance, decreased pain. Pt has since last visit had a set back, reporting increased pain radiating down to toes and cramping of legs.  Pt demo difficulty tolerating ther ex and was unable to tolerate MMT due to pain. Pt reports she is interested in continuation of therapy to decrease pain and improve functional mobility.   Pt will benefit from skilled therapeutic intervention in order to improve on the following deficits Decreased endurance;Decreased balance;Pain;Obesity;Decreased strength;Decreased mobility;Abnormal gait;Decreased range of motion;Impaired flexibility;Improper body mechanics;Postural dysfunction;Difficulty walking   Rehab Potential Good   PT Frequency 2x / week   PT Treatment/Interventions Patient/family education;Therapeutic exercise;Traction;Manual techniques   PT Next Visit Plan spoke to supervising PT regarding pt's progress and her interest in continuation of therapy.    Consulted and Agree with Plan of Care Patient        Problem List Patient Active Problem List   Diagnosis Date Noted  . Chronic low back pain 08/23/2014  . Dehydration 06/26/2014  . Left-sided low back pain with left-sided sciatica 06/19/2014  . Severe obesity (BMI >= 40) 11/17/2013  . Depression 09/21/2013  . Osteoarthritis of both hips  08/25/2013  . Hypertriglyceridemia 08/24/2013  . Foot fracture, right 08/22/2013  . Fibromyalgia 07/18/2013  . Neuropathy 07/18/2013  . Low back  pain with radiation 07/18/2013  . Migraines 07/18/2013  . Atrophic vaginitis 02/26/2012    Celyn P Holt 10/09/2014, 1:48 PM  [facility] [place], [place], [address] Phone: [phone]   Fax:  [phone]

## 2014-10-11 ENCOUNTER — Other Ambulatory Visit: Payer: Self-pay | Admitting: Physician Assistant

## 2014-10-12 ENCOUNTER — Ambulatory Visit (INDEPENDENT_AMBULATORY_CARE_PROVIDER_SITE_OTHER): Payer: 59 | Admitting: Rehabilitative and Restorative Service Providers"

## 2014-10-12 DIAGNOSIS — M545 Low back pain: Secondary | ICD-10-CM | POA: Diagnosis not present

## 2014-10-12 DIAGNOSIS — R29898 Other symptoms and signs involving the musculoskeletal system: Secondary | ICD-10-CM

## 2014-10-12 NOTE — Patient Instructions (Signed)
Piriformis Stretch   Lying on back, pull right knee toward opposite shoulder. Hold _20-30___ seconds. Repeat _3___ times. Do _2-3___ sessions per day.    Abdominal Bracing With Pelvic Floor (Hook-Lying)   With neutral spine, tighten pelvic floor and abdominals and the muscles at your waist. Repeat _10__ times. Hold 10 sec. Do many_ times a day.

## 2014-10-12 NOTE — Therapy (Signed)
[place], [place], [address] Phone: [phone]   Fax:  [phone]  Physical Therapy Treatment  Patient Details  Name: Kathryn Cline MRN: [id number] Date of Birth: [birth year] Referring Provider:  Donella Stade, PA-C  Encounter Date: 10/12/2014      PT End of Session - 10/12/14 1155    Visit Number 17   Number of Visits 28   Date for PT Re-Evaluation 11/17/14   PT Start Time 1151   PT Stop Time 1257   PT Time Calculation (min) 66 min   Activity Tolerance Patient tolerated treatment well   Behavior During Therapy [facility] for tasks assessed/performed      Past Medical History  Diagnosis Date  . Neuropathy   . Fibromyalgia   . GERD (gastroesophageal reflux disease)   . Allergy   . Hypertension     Past Surgical History  Procedure Laterality Date  . Appendectomy    . Abdominal hysterectomy    . Wrist surgery    . Left foot surgery    . Cyst removal trunk    . Cholecystectomy      There were no vitals filed for this visit.  Visit Diagnosis:  Midline low back pain, with sciatica presence unspecified  Weakness of both lower extremities      Subjective Assessment - 10/12/14 1155    Subjective Patient reports that pain in LB is bettertoday. She has recovered from the concert. Almost back to baseline.   Pertinent History HTN, asthma, fibromyalgia, neuropathy   Limitations House hold activities;Walking;Standing;Sitting   How long can you sit comfortably? 15 min   How long can you stand comfortably? 10 min   How long can you walk comfortably? 15 min   Diagnostic tests MRI: bulging disc, arthritis and bone detoriation   Patient Stated Goals improve function; improve mobility "get out more"   Pain Score 5    Pain Location Back   Pain Orientation Left   Pain Descriptors / Indicators Burning;Aching   Pain Type Chronic pain   Pain Onset More than a month ago   Pain Frequency Constant             [facility] Adult PT Treatment/Exercise - 10/12/14 0001    Lumbar Exercises: Stretches   Passive Hamstring Stretch 3 reps;30 seconds   Piriformis Stretch 3 reps;30 seconds   Piriformis Stretch Limitations piriformis stretch supine with strap Lt foot over Rt LE in hooklying 3 reps 30 sec    Lumbar Exercises: Aerobic   Stationary Bike NuStep L3: 10 min    Lumbar Exercises: Supine   Ab Set 10 reps  10 sec hold - 3 part core    AB Set Limitations added pelvic floor and multifidi for core stabilization 10 reps 10 sec hold   Straight Leg Raise 10 reps  ea LE   Other Supine Lumbar Exercises hip rotation 10 reps ea   Lumbar Exercises: Sidelying   Clam 10 reps   Hip Abduction 10 reps   Lumbar Exercises: Prone   Straight Leg Raise 10 reps   Other Prone Lumbar Exercises Hit extensioin with knee bent 10 reps ea LE   Other Prone Lumbar Exercises prone press ups x 5 slow reps    Lumbar Exercises: Quadruped   Madcat/Old Horse 10 reps   Knee/Hip Exercises: Standing   Heel Raises 20 reps   Side Lunges 1 set;10 reps   Functional Squat 10 reps   SLS 10 with  core contractioin   Knee/Hip Exercises: Sidelying   Clams 10   Knee/Hip Exercises: Prone   Other Prone Exercises prone press up 10 reps   Modalities   Modalities --  supine static traciton   Cryotherapy   Number Minutes Cryotherapy 15 Minutes   Cryotherapy Location Lumbar Spine   Type of Cryotherapy Ice pack   Electrical Stimulation   Electrical Stimulation Location bilat piriformis and lumbar spine   Electrical Stimulation Action Pre mod   Electrical Stimulation Parameters to tolerance   Electrical Stimulation Goals Pain   Traction   Type of Traction Lumbar   Max (lbs) 85   Time 15                PT Education - 10/12/14 1227    Education provided Yes   Education Details discussed chronic pain, possible trial of home lumbar traction; added core stabilization; encouraged continued HEP consisently   Person(s)  Educated Patient   Methods Explanation;Demonstration;Verbal cues;Handout   Comprehension Verbalized understanding;Returned demonstration          PT Short Term Goals - 10/09/14 1244    PT SHORT TERM GOAL #1   Title independent with HEP (09/04/14)   Time 4   Period Weeks   Status Achieved   PT SHORT TERM GOAL #2   Title improve ROM by 5 degrees of lumbar spine for improved mobility (09/04/14)   Time 4   Period Weeks   Status Achieved   PT SHORT TERM GOAL #3   Title report pain < 5/10 for improved function (09/04/14)   Time 4   Period Weeks   Status Achieved   PT SHORT TERM GOAL #4   Title tolerate sitting/standing/walking at least 20 min each position without increase in pain for improved function (09/04/14)   Time 4   Period Weeks   Status On-going           PT Long Term Goals - 10/09/14 1240    PT LONG TERM GOAL #1   Title independent with advanced HEP (10/03/14)   Time 8   Period Weeks   Status On-going   PT LONG TERM GOAL #2   Title verbalize understanding of posture/body mechanics to reduce the risk of reinjury (10/03/14)   Time 4   Period Weeks   Status Achieved   PT LONG TERM GOAL #3   Title lumbar ROM improved by 10 degrees all motions for improved function (10/03/14)   Time 8   Period Weeks   Status Achieved   PT LONG TERM GOAL #4   Title tolerate walking > 30 min without increase in pain for improved function (10/03/14)   Time 8   Period Weeks   Status Partially Met  reported can walk 15-20 min (09/22/14)   PT LONG TERM GOAL #5   Title report pain < 3/10 for improved pain (10/03/14)   Time 8   Period Weeks   Status On-going               Plan - 10/12/14 1253    Clinical Impression Statement Improved today from flare up related to attending a consert last week. Almost back to base line. Interested in exploring possible use of home lumbar traction if available   Pt will benefit from skilled therapeutic intervention in order to improve on the  following deficits Decreased endurance;Decreased balance;Pain;Obesity;Decreased strength;Decreased mobility;Abnormal gait;Decreased range of motion;Impaired flexibility;Improper body mechanics;Postural dysfunction;Difficulty walking   Rehab Potential Good   PT Frequency 2x /  week   PT Duration 8 weeks   PT Treatment/Interventions Patient/family education;Therapeutic exercise;Traction;Manual techniques   PT Next Visit Plan assess response to supine static lumbar traction in the clinic; consider home lumbar traction; continue with lumbar stabilization exercises   PT Home Exercise Plan added 3 part core stabilization exercise to add with activities and exercises        Problem List Patient Active Problem List   Diagnosis Date Noted  . Chronic low back pain 08/23/2014  . Dehydration 06/26/2014  . Left-sided low back pain with left-sided sciatica 06/19/2014  . Severe obesity (BMI >= 40) 11/17/2013  . Depression 09/21/2013  . Osteoarthritis of both hips 08/25/2013  . Hypertriglyceridemia 08/24/2013  . Foot fracture, right 08/22/2013  . Fibromyalgia 07/18/2013  . Neuropathy 07/18/2013  . Low back pain with radiation 07/18/2013  . Migraines 07/18/2013  . Atrophic vaginitis 02/26/2012    Tristian Bouska Nilda Simmer, PT, MPH 10/12/2014, 1:02 PM  [facility] [place], [place], [address] Phone: [phone]   Fax:  [phone]

## 2014-10-13 ENCOUNTER — Other Ambulatory Visit (HOSPITAL_COMMUNITY): Payer: Self-pay | Admitting: Physician Assistant

## 2014-10-13 ENCOUNTER — Ambulatory Visit (HOSPITAL_COMMUNITY): Payer: 59 | Admitting: Physician Assistant

## 2014-10-13 ENCOUNTER — Ambulatory Visit (INDEPENDENT_AMBULATORY_CARE_PROVIDER_SITE_OTHER): Payer: 59 | Admitting: Licensed Clinical Social Worker

## 2014-10-13 DIAGNOSIS — F341 Dysthymic disorder: Secondary | ICD-10-CM

## 2014-10-13 DIAGNOSIS — F331 Major depressive disorder, recurrent, moderate: Secondary | ICD-10-CM

## 2014-10-13 MED ORDER — SERTRALINE HCL 50 MG PO TABS: 75.0000 mg | ORAL_TABLET | Freq: Every day | ORAL | Status: DC

## 2014-10-13 NOTE — Progress Notes (Signed)
THERAPIST PROGRESS NOTE  Session Time: 1:05pm-2:00pm  Participation Level: Active  Behavioral Response:  Alert   Type of Therapy: Individual Therapy  Treatment Goals addressed: Depression and anxiety  Interventions: CBT  Therapist Interventions:  Had patient complete a PHQ-9 and GAD-7.  Introduced the [employer].  Explained how it is our thoughts about a situation that determine how we end up feeling rather than the situation itself.  Introduced a tool called a Thought Log and explained how it can be used to increase awareness of your thoughts and how they are influencing your feelings and behavior.  Guided patient through some examples.  Encouraged patient to work on identifying her unhelpful self-talk.        Summary:   PHQ-9 score was 7.  GAD-7 score was 4.  This is a significant improvement.   Indicated she understood concepts presented today.  Agreed to work on building greater awareness of her self-talk.   Described a recent situation that brought on a lot of anxiety, but she managed to use focused breathing to calm herself and come up with a solution to the problem.         Suicidal/Homicidal: Denied both    Plan: Scheduled to return in approximately 2 weeks.   Diagnosis:Persistent Depressive Disorder with anxious distress      Garnette Scheuermann, LCSW

## 2014-10-16 ENCOUNTER — Ambulatory Visit (INDEPENDENT_AMBULATORY_CARE_PROVIDER_SITE_OTHER): Payer: 59 | Admitting: Physical Therapy

## 2014-10-16 ENCOUNTER — Encounter: Payer: Self-pay | Admitting: Physical Therapy

## 2014-10-16 DIAGNOSIS — M545 Low back pain: Secondary | ICD-10-CM | POA: Diagnosis not present

## 2014-10-16 DIAGNOSIS — R29898 Other symptoms and signs involving the musculoskeletal system: Secondary | ICD-10-CM | POA: Diagnosis not present

## 2014-10-16 NOTE — Therapy (Signed)
[place], [place], [address] Phone: [phone]   Fax:  [phone]  Physical Therapy Treatment  Patient Details  Name: Kathryn Cline MRN: [id number] Date of Birth: [birth year] Referring Provider:  Donella Stade, PA-C  Encounter Date: 10/16/2014      PT End of Session - 10/16/14 1413    Visit Number 18   Number of Visits 28   Date for PT Re-Evaluation 11/17/14   PT Start Time 1403   Activity Tolerance Patient tolerated treatment well      Past Medical History  Diagnosis Date  . Neuropathy   . Fibromyalgia   . GERD (gastroesophageal reflux disease)   . Allergy   . Hypertension     Past Surgical History  Procedure Laterality Date  . Appendectomy    . Abdominal hysterectomy    . Wrist surgery    . Left foot surgery    . Cyst removal trunk    . Cholecystectomy      There were no vitals filed for this visit.  Visit Diagnosis:  Midline low back pain, with sciatica presence unspecified  Weakness of both lower extremities      Subjective Assessment - 10/16/14 1404    Subjective Pt reports she is doing OK, still has that one spot in the low back, slightly to the Lt   Currently in Pain? Yes   Pain Score 4    Pain Location Back   Pain Orientation Left   Pain Descriptors / Indicators Burning;Aching   Pain Type Chronic pain   Pain Onset More than a month ago   Pain Frequency Constant   Aggravating Factors  prolonged sitting   Pain Relieving Factors TENS, heat, ice and stretches                         [facility] Adult PT Treatment/Exercise - 10/16/14 0001    Lumbar Exercises: Stretches   Passive Hamstring Stretch 30 seconds  bilat with strap   ITB Stretch 3 reps;30 seconds  cross body with strap bilat   Lumbar Exercises: Aerobic   Stationary Bike Nu step L5 x 5'   Lumbar Exercises: Supine   Ab Set 5 reps;5 seconds  3 part core   Clam 10 reps  single leg and  then bilat   Bridge 20 reps   Other Supine Lumbar Exercises 10x 10sec leg presses   Cryotherapy   Number Minutes Cryotherapy 15 Minutes   Cryotherapy Location Lumbar Spine   Type of Cryotherapy Ice pack   Electrical Stimulation   Electrical Stimulation Location low back   Electrical Stimulation Action IFC   Electrical Stimulation Parameters to tolerance   Electrical Stimulation Goals Pain   Traction   Type of Traction Lumbar   Max (lbs) 85   Hold Time --  static, supine   Time 15   Manual Therapy   Manual Therapy Joint mobilization;Myofascial release   Joint Mobilization sacral mobs and grade III mobs Lt UPA L4                  PT Short Term Goals - 10/09/14 1244    PT SHORT TERM GOAL #1   Title independent with HEP (09/04/14)   Time 4   Period Weeks   Status Achieved   PT SHORT TERM GOAL #2   Title improve ROM by 5 degrees of lumbar spine for improved mobility (09/04/14)   Time 4  Period Weeks   Status Achieved   PT SHORT TERM GOAL #3   Title report pain < 5/10 for improved function (09/04/14)   Time 4   Period Weeks   Status Achieved   PT SHORT TERM GOAL #4   Title tolerate sitting/standing/walking at least 20 min each position without increase in pain for improved function (09/04/14)   Time 4   Period Weeks   Status On-going           PT Long Term Goals - 10/09/14 1240    PT LONG TERM GOAL #1   Title independent with advanced HEP (10/03/14)   Time 8   Period Weeks   Status On-going   PT LONG TERM GOAL #2   Title verbalize understanding of posture/body mechanics to reduce the risk of reinjury (10/03/14)   Time 4   Period Weeks   Status Achieved   PT LONG TERM GOAL #3   Title lumbar ROM improved by 10 degrees all motions for improved function (10/03/14)   Time 8   Period Weeks   Status Achieved   PT LONG TERM GOAL #4   Title tolerate walking > 30 min without increase in pain for improved function (10/03/14)   Time 8   Period Weeks   Status  Partially Met  reported can walk 15-20 min (09/22/14)   PT LONG TERM GOAL #5   Title report pain < 3/10 for improved pain (10/03/14)   Time 8   Period Weeks   Status On-going               Plan - 10/16/14 1439    Clinical Impression Statement Pt is back to only having the pain in one spot, Lt low back.  Trying static supine lumbar traction to see if a home unit would be beneficial.  No new goal met today.    Pt will benefit from skilled therapeutic intervention in order to improve on the following deficits Decreased endurance;Decreased balance;Pain;Obesity;Decreased strength;Decreased mobility;Abnormal gait;Decreased range of motion;Impaired flexibility;Improper body mechanics;Postural dysfunction;Difficulty walking   Rehab Potential Good   PT Frequency 2x / week   PT Duration 8 weeks   PT Treatment/Interventions Patient/family education;Therapeutic exercise;Traction;Manual techniques   PT Next Visit Plan assess response to supine static lumbar traction in the clinic; consider home lumbar traction; continue with lumbar stabilization exercises   Consulted and Agree with Plan of Care Patient        Problem List Patient Active Problem List   Diagnosis Date Noted  . Chronic low back pain 08/23/2014  . Dehydration 06/26/2014  . Left-sided low back pain with left-sided sciatica 06/19/2014  . Severe obesity (BMI >= 40) 11/17/2013  . Depression 09/21/2013  . Osteoarthritis of both hips 08/25/2013  . Hypertriglyceridemia 08/24/2013  . Foot fracture, right 08/22/2013  . Fibromyalgia 07/18/2013  . Neuropathy 07/18/2013  . Low back pain with radiation 07/18/2013  . Migraines 07/18/2013  . Atrophic vaginitis 02/26/2012    Jeral Pinch, PT 10/16/2014, 2:41 PM  [facility] [place], [place], [address] Phone: [phone]   Fax:  [phone]

## 2014-10-19 ENCOUNTER — Ambulatory Visit (INDEPENDENT_AMBULATORY_CARE_PROVIDER_SITE_OTHER): Payer: 59 | Admitting: Rehabilitative and Restorative Service Providers"

## 2014-10-19 ENCOUNTER — Ambulatory Visit (HOSPITAL_COMMUNITY): Payer: Self-pay | Admitting: Licensed Clinical Social Worker

## 2014-10-19 DIAGNOSIS — R29898 Other symptoms and signs involving the musculoskeletal system: Secondary | ICD-10-CM | POA: Diagnosis not present

## 2014-10-19 DIAGNOSIS — M545 Low back pain: Secondary | ICD-10-CM | POA: Diagnosis not present

## 2014-10-19 NOTE — Patient Instructions (Signed)
Gluteal Sets   Tighten buttocks while pressing pelvis to floor. Hold __10__ seconds. Repeat _10___ times per set. Do __1__ sets per session.    Ball release work lying on back or standing; ball to your liking 4 inch to 2 inch size.

## 2014-10-19 NOTE — Therapy (Signed)
[place], [place], [address] Phone: [phone]   Fax:  [phone]  Physical Therapy Treatment  Patient Details  Name: Kathryn Cline MRN: [id number] Date of Birth: 1975-06-08 Referring Provider:  Donella Stade, PA-C  Encounter Date: 10/19/2014      PT End of Session - 10/19/14 1306    Visit Number 19   Number of Visits 28   Date for PT Re-Evaluation 11/17/14   PT Start Time 6767   PT Stop Time 2094   PT Time Calculation (min) 63 min   Activity Tolerance Patient tolerated treatment well;No increased pain      Past Medical History  Diagnosis Date  . Neuropathy   . Fibromyalgia   . GERD (gastroesophageal reflux disease)   . Allergy   . Hypertension     Past Surgical History  Procedure Laterality Date  . Appendectomy    . Abdominal hysterectomy    . Wrist surgery    . Left foot surgery    . Cyst removal trunk    . Cholecystectomy      There were no vitals filed for this visit.  Visit Diagnosis:  Midline low back pain, with sciatica presence unspecified  Weakness of both lower extremities      Subjective Assessment - 10/19/14 1155    Subjective Patient reports that she saw the pain doctor yesterday. She decreased her Topamax to 12.5 mg at night. She wants patirnt to return to Dr T for evaluation of hip pain. Kathryn Cline has not made an appt to return to Dr T yet. Feels he will give her another injection which hurts going in - helps later - but hurts.   Pertinent History HTN, asthma, fibromyalgia, neuropathy   Limitations House hold activities;Walking;Standing;Sitting   How long can you sit comfortably? 15 min   How long can you stand comfortably? 10 min   Diagnostic tests MRI: bulging disc, arthritis and bone detoriation   Patient Stated Goals improve function; improve mobility "get out more"   Currently in Pain? Yes   Pain Score 6    Pain Location Back   Pain Orientation Left   Pain Descriptors / Indicators Aching;Burning   Pain Onset More than a month ago               [facility] Adult PT Treatment/Exercise - 10/19/14 0001    Lumbar Exercises: Stretches   Passive Hamstring Stretch 30 seconds  bilat with strap   ITB Stretch 3 reps;30 seconds  cross body with strap bilat   Lumbar Exercises: Aerobic   Stationary Bike Nu step L5 x 5'   Lumbar Exercises: Standing   Heel Raises --   Lumbar Exercises: Supine   Ab Set 10 reps  10 sec hold   Clam 10 reps  single leg and then bilat   Bridge 10 reps   Other Supine Lumbar Exercises 10x 10sec leg presses   Lumbar Exercises: Sidelying   Clam 20 reps   Lumbar Exercises: Prone   Straight Leg Raise 10 reps   Straight Leg Raises Limitations 10 reps with knee flexed   Other Prone Lumbar Exercises glut sets x10   Other Prone Lumbar Exercises prone press up x10   Lumbar Exercises: Quadruped   Madcat/Old Horse 10 reps   Knee/Hip Exercises: Standing   Heel Raises 20 reps   Side Lunges 10 reps   Functional Squat 10 reps   SLS 10   Other Standing Knee Exercises  Myofacial ball release supine and standing various balls    Traction   Type of Traction Lumbar   Min (lbs) 45   Max (lbs) 85   Hold Time 60   Rest Time 20   Time 15           PT Education - 10/19/14 1304    Education provided Yes   Education Details discussed home traction unit; use of TENS unit for home; added glut set in prone; added myofacial ball release work supine and standing   Person(s) Educated Patient   Methods Explanation;Verbal cues;Handout   Comprehension Verbalized understanding;Returned demonstration          PT Short Term Goals - 10/09/14 1244    PT SHORT TERM GOAL #1   Title independent with HEP (09/04/14)   Time 4   Period Weeks   Status Achieved   PT SHORT TERM GOAL #2   Title improve ROM by 5 degrees of lumbar spine for improved mobility (09/04/14)   Time 4   Period Weeks   Status Achieved   PT SHORT TERM GOAL #3    Title report pain < 5/10 for improved function (09/04/14)   Time 4   Period Weeks   Status Achieved   PT SHORT TERM GOAL #4   Title tolerate sitting/standing/walking at least 20 min each position without increase in pain for improved function (09/04/14)   Time 4   Period Weeks   Status On-going           PT Long Term Goals - 10/09/14 1240    PT LONG TERM GOAL #1   Title independent with advanced HEP (10/03/14)   Time 8   Period Weeks   Status On-going   PT LONG TERM GOAL #2   Title verbalize understanding of posture/body mechanics to reduce the risk of reinjury (10/03/14)   Time 4   Period Weeks   Status Achieved   PT LONG TERM GOAL #3   Title lumbar ROM improved by 10 degrees all motions for improved function (10/03/14)   Time 8   Period Weeks   Status Achieved   PT LONG TERM GOAL #4   Title tolerate walking > 30 min without increase in pain for improved function (10/03/14)   Time 8   Period Weeks   Status Partially Met  reported can walk 15-20 min (09/22/14)   PT LONG TERM GOAL #5   Title report pain < 3/10 for improved pain (10/03/14)   Time 8   Period Weeks   Status On-going           Plan - 10/19/14 1309    Clinical Impression Statement Pain in the Lt LB some days worse than others - likes prone traction better and intermittent better than static traction   Pt will benefit from skilled therapeutic intervention in order to improve on the following deficits Decreased endurance;Decreased balance;Pain;Obesity;Decreased strength;Decreased mobility;Abnormal gait;Decreased range of motion;Impaired flexibility;Improper body mechanics;Postural dysfunction;Difficulty walking   Rehab Potential Good   PT Frequency 2x / week   PT Duration 8 weeks   PT Treatment/Interventions Patient/family education;Therapeutic exercise;Traction;Manual techniques   PT Next Visit Plan continue spinal stabilization and exercise to increase mobility and functional activity level. Patient has not  checked on home lumbar traction - will try supine traction again as indicated if pt decides to investigate this further   PT Home Exercise Plan to continue with HEP; add myofacial ball release work   [employer] and Agree with Plan  of Care Patient        Problem List Patient Active Problem List   Diagnosis Date Noted  . Chronic low back pain 08/23/2014  . Dehydration 06/26/2014  . Left-sided low back pain with left-sided sciatica 06/19/2014  . Severe obesity (BMI >= 40) 11/17/2013  . Depression 09/21/2013  . Osteoarthritis of both hips 08/25/2013  . Hypertriglyceridemia 08/24/2013  . Foot fracture, right 08/22/2013  . Fibromyalgia 07/18/2013  . Neuropathy 07/18/2013  . Low back pain with radiation 07/18/2013  . Migraines 07/18/2013  . Atrophic vaginitis 02/26/2012    Marilou Barnfield Nilda Simmer, PT, MPH 10/19/2014, 1:15 PM  [facility] [place], [place], [address] Phone: [phone]   Fax:  [phone]

## 2014-10-25 NOTE — Telephone Encounter (Signed)
Patient requested medication refilled for Zoloft. Meds refilled as documented. Patient asked to follow up. Marlane Hatcher. Ajanae Virag RPAC

## 2014-10-26 ENCOUNTER — Other Ambulatory Visit: Payer: Self-pay | Admitting: Physician Assistant

## 2014-10-27 ENCOUNTER — Ambulatory Visit (HOSPITAL_COMMUNITY): Payer: Self-pay | Admitting: Licensed Clinical Social Worker

## 2014-10-30 ENCOUNTER — Ambulatory Visit (INDEPENDENT_AMBULATORY_CARE_PROVIDER_SITE_OTHER): Payer: 59 | Admitting: Physical Therapy

## 2014-10-30 DIAGNOSIS — R29898 Other symptoms and signs involving the musculoskeletal system: Secondary | ICD-10-CM

## 2014-10-30 DIAGNOSIS — M545 Low back pain: Secondary | ICD-10-CM | POA: Diagnosis not present

## 2014-10-30 NOTE — Therapy (Signed)
[place], [place], [address] Phone: [phone]   Fax:  [phone]  Physical Therapy Treatment  Patient Details  Name: Kathryn Cline MRN: [id number] Date of Birth: 1975-09-12 Referring Provider:  Donella Stade, PA-C  Encounter Date: 10/30/2014      PT End of Session - 10/30/14 1100    Visit Number 20   Number of Visits 28   Date for PT Re-Evaluation 11/17/14   PT Start Time 1059   PT Stop Time 9030   PT Time Calculation (min) 46 min      Past Medical History  Diagnosis Date  . Neuropathy   . Fibromyalgia   . GERD (gastroesophageal reflux disease)   . Allergy   . Hypertension     Past Surgical History  Procedure Laterality Date  . Appendectomy    . Abdominal hysterectomy    . Wrist surgery    . Left foot surgery    . Cyst removal trunk    . Cholecystectomy      There were no vitals filed for this visit.  Visit Diagnosis:  Midline low back pain, with sciatica presence unspecified  Weakness of both lower extremities      Subjective Assessment - 10/30/14 1100    Subjective Pt reports she hasn't taken pain medicine or muscle relaxers for 4.5 days.  "Last wk was rough wk with my migrane".     Currently in Pain? No/denies  just "sore"  in Lt SI joint            [facility] PT Assessment - 10/30/14 0001    Assessment   Medical Diagnosis LBP   Next MD Visit PRN   Strength   Right Hip Flexion 5/5   Right Hip Extension 5/5   Right Hip ABduction 5/5   Left Hip Flexion --  5-/5   Left Hip Extension 5/5  painful with break test   Left Hip ABduction 5/5  with pain                     [facility] Adult PT Treatment/Exercise - 10/30/14 0001    Lumbar Exercises: Aerobic   Stationary Bike NuStep L6: 9 min   Lumbar Exercises: Quadruped   Madcat/Old Horse 10 reps   Opposite Arm/Leg Raise Right arm/Left leg;Left arm/Right leg;5 reps;1 second (still challenging)  Required VC for form.    Knee/Hip Exercises: Stretches   Passive Hamstring Stretch 4 reps;Right;Left;20 seconds  with myofascial release ball on Lt hip.    ITB Stretch Right;Left;4 reps;20 seconds   Piriformis Stretch 2 reps;Right;Left;20 seconds   Knee/Hip Exercises: Standing   Lateral Step Up 1 set;5 reps;Right;Left;Hand Hold: 2;Step Height: 6"   Forward Step Up Right;Left;1 set;5 reps;Step Height: 6";Hand Hold: 1   Other Standing Knee Exercises sit to/from stand without UE support (focus on controlled descent) x 10 - challenging                PT Education - 10/30/14 1312    Education provided Yes   Education Details Added sit to stands and 5 reps x 2 sets of Lateral step ups to HEP.    Person(s) Educated Patient   Methods Explanation;Demonstration   Comprehension Returned demonstration;Verbalized understanding          PT Short Term Goals - 10/09/14 1244    PT SHORT TERM GOAL #1   Title independent with HEP (09/04/14)   Time 4   Period Weeks  Status Achieved   PT SHORT TERM GOAL #2   Title improve ROM by 5 degrees of lumbar spine for improved mobility (09/04/14)   Time 4   Period Weeks   Status Achieved   PT SHORT TERM GOAL #3   Title report pain < 5/10 for improved function (09/04/14)   Time 4   Period Weeks   Status Achieved   PT SHORT TERM GOAL #4   Title tolerate sitting/standing/walking at least 20 min each position without increase in pain for improved function (09/04/14)   Time 4   Period Weeks   Status On-going           PT Long Term Goals - 10/09/14 1240    PT LONG TERM GOAL #1   Title independent with advanced HEP (10/03/14)   Time 8   Period Weeks   Status On-going   PT LONG TERM GOAL #2   Title verbalize understanding of posture/body mechanics to reduce the risk of reinjury (10/03/14)   Time 4   Period Weeks   Status Achieved   PT LONG TERM GOAL #3   Title lumbar ROM improved by 10 degrees all motions for improved function (10/03/14)    Time 8   Period Weeks   Status Achieved   PT LONG TERM GOAL #4   Title tolerate walking > 30 min without increase in pain for improved function (10/03/14)   Time 8   Period Weeks   Status Partially Met  reported can walk 15-20 min (09/22/14)   PT LONG TERM GOAL #5   Title report pain < 3/10 for improved pain (10/03/14)   Time 8   Period Weeks   Status On-going               Plan - 10/30/14 1133    Clinical Impression Statement Traction held since pt pain free for 4 days. Pt demo improved BLE strength.   Quadruped opp arm/leg still challenging, as well as stairs.  Pt began experiencing pain up to 3/10 in Lt SI joint after performing 5 reps of forward step ups (stepping down -backwards with LLE); pain eased with stretches.  Pt declined modalities - will see how she does without traction/modalities. Pt to use ice/TENS at home as needed. Progressing towards goals.    Pt will benefit from skilled therapeutic intervention in order to improve on the following deficits Decreased endurance;Decreased balance;Pain;Obesity;Decreased strength;Decreased mobility;Abnormal gait;Decreased range of motion;Impaired flexibility;Improper body mechanics;Postural dysfunction;Difficulty walking   Rehab Potential Good   PT Frequency 2x / week   PT Duration 8 weeks   PT Treatment/Interventions Patient/family education;Therapeutic exercise;Traction;Manual techniques   PT Next Visit Plan Continue progressive LE and core strengthening.  Assess readiness for d/c to HEP.     Consulted and Agree with Plan of Care Patient        Problem List Patient Active Problem List   Diagnosis Date Noted  . Chronic low back pain 08/23/2014  . Dehydration 06/26/2014  . Left-sided low back pain with left-sided sciatica 06/19/2014  . Severe obesity (BMI >= 40) 11/17/2013  . Depression 09/21/2013  . Osteoarthritis of both hips 08/25/2013  . Hypertriglyceridemia 08/24/2013  . Foot fracture, right 08/22/2013  .  Fibromyalgia 07/18/2013  . Neuropathy 07/18/2013  . Low back pain with radiation 07/18/2013  . Migraines 07/18/2013  . Atrophic vaginitis 02/26/2012   Kerin Perna, PTA 10/30/2014 1:14 PM   [facility] [place], [place], [address] Phone:  [phone]   Fax:  [phone]

## 2014-11-01 ENCOUNTER — Ambulatory Visit (INDEPENDENT_AMBULATORY_CARE_PROVIDER_SITE_OTHER): Payer: 59 | Admitting: Physical Therapy

## 2014-11-01 DIAGNOSIS — M545 Low back pain: Secondary | ICD-10-CM | POA: Diagnosis not present

## 2014-11-01 DIAGNOSIS — R29898 Other symptoms and signs involving the musculoskeletal system: Secondary | ICD-10-CM | POA: Diagnosis not present

## 2014-11-01 NOTE — Therapy (Signed)
[facility] [place], [place], [address] Phone: [phone]   Fax:  [phone]  Physical Therapy Treatment  Patient Details  Name: Kathryn Cline MRN: [id number] Date of Birth: [birth year] Referring Provider:  Donella Stade, PA-C  Encounter Date: 11/01/2014      PT End of Session - 11/01/14 1204    Visit Number 21   Number of Visits 28   Date for PT Re-Evaluation 11/17/14   PT Start Time 1100   PT Stop Time 1140   PT Time Calculation (min) 40 min   Activity Tolerance Patient tolerated treatment well;No increased pain   Behavior During Therapy [facility] for tasks assessed/performed      Past Medical History  Diagnosis Date  . Neuropathy   . Fibromyalgia   . GERD (gastroesophageal reflux disease)   . Allergy   . Hypertension     Past Surgical History  Procedure Laterality Date  . Appendectomy    . Abdominal hysterectomy    . Wrist surgery    . Left foot surgery    . Cyst removal trunk    . Cholecystectomy      There were no vitals filed for this visit.  Visit Diagnosis:  Midline low back pain, with sciatica presence unspecified  Weakness of both lower extremities      Subjective Assessment - 11/01/14 1106    Subjective slept on couch last night because she was having trouble sleeping (no pain just couldn't go to sleep) and was a little sore this morning.     How long can you walk comfortably? 20 min   Currently in Pain? No/denies  just some soreness            [facility] PT Assessment - 11/01/14 1101    Observation/Other Assessments   Focus on Therapeutic Outcomes (FOTO)  50 (50% limited)                     [facility] Adult PT Treatment/Exercise - 11/01/14 1108    Lumbar Exercises: Stretches   Passive Hamstring Stretch 30 seconds   Passive Hamstring Stretch Limitations bil with strap   Lower Trunk Rotation 5 reps   Lower Trunk Rotation Limitations bil   Press Ups  Limitations 10x5 sec   ITB Stretch 30 seconds   ITB Stretch Limitations bil with strap   Lumbar Exercises: Aerobic   Stationary Bike NuStep L5: 8 min    Lumbar Exercises: Supine   Ab Set 10 reps   Lumbar Exercises: Prone   Straight Leg Raise 10 reps   Lumbar Exercises: Quadruped   Madcat/Old Horse 10 reps   Straight Leg Raise 10 reps   Straight Leg Raises Limitations bil   Opposite Arm/Leg Raise Right arm/Left leg;Left arm/Right leg;5 reps;1 second   Knee/Hip Exercises: Sidelying   Clams 10                PT Education - 11/01/14 1142    Education provided Yes   Education Details Banker for everyday household tasks.  Discussed use of reacher for laundry as pt reports dryer set up is difficult to maintain proper mechanics.     Person(s) Educated Patient   Methods Explanation   Comprehension Verbalized understanding          PT Short Term Goals - 10/09/14 1244    PT SHORT TERM GOAL #1   Title independent with HEP (09/04/14)   Time 4   Period Weeks  Status Achieved   PT SHORT TERM GOAL #2   Title improve ROM by 5 degrees of lumbar spine for improved mobility (09/04/14)   Time 4   Period Weeks   Status Achieved   PT SHORT TERM GOAL #3   Title report pain < 5/10 for improved function (09/04/14)   Time 4   Period Weeks   Status Achieved   PT SHORT TERM GOAL #4   Title tolerate sitting/standing/walking at least 20 min each position without increase in pain for improved function (09/04/14)   Time 4   Period Weeks   Status On-going           PT Long Term Goals - 11/01/14 1127    PT LONG TERM GOAL #1   Title independent with advanced HEP (10/03/14)   Status Achieved   PT LONG TERM GOAL #2   Title verbalize understanding of posture/body mechanics to reduce the risk of reinjury (10/03/14)   Status Achieved   PT LONG TERM GOAL #3   Title lumbar ROM improved by 10 degrees all motions for improved function (10/03/14)   Status Achieved   PT LONG TERM  GOAL #4   Title tolerate walking > 30 min without increase in pain for improved function (10/03/14)   Status Partially Met  up to 20 min; slowly increasing distance and time   PT LONG TERM GOAL #5   Title report pain < 3/10 for improved pain (10/03/14)   Status Achieved               Plan - 11/01/14 1205    Clinical Impression Statement Pt has met all LTGs except walking time is up to 20 min and goal was 30 min.  Overall pt feels comfortable and verbalizes plans to increase activity to reach 30 min goal.  Pt ready for transition to home program as all education has been completed and pt without significant pain.   PT Next Visit Plan d/c PT        Problem List Patient Active Problem List   Diagnosis Date Noted  . Chronic low back pain 08/23/2014  . Dehydration 06/26/2014  . Left-sided low back pain with left-sided sciatica 06/19/2014  . Severe obesity (BMI >= 40) 11/17/2013  . Depression 09/21/2013  . Osteoarthritis of both hips 08/25/2013  . Hypertriglyceridemia 08/24/2013  . Foot fracture, right 08/22/2013  . Fibromyalgia 07/18/2013  . Neuropathy 07/18/2013  . Low back pain with radiation 07/18/2013  . Migraines 07/18/2013  . Atrophic vaginitis 02/26/2012   Laureen Abrahams, PT, DPT 11/01/2014 12:09 PM  [facility] [place], [place], [address] Phone: [phone]   Fax:  [phone]    PHYSICAL THERAPY DISCHARGE SUMMARY  Visits from Start of Care: 21  Current functional level related to goals / functional outcomes: See above; pt met 4/5 LTGs and has progressed towards remaining LTG.  Pt verbalized plans to increase walking time/distance to obtain goal.   Remaining deficits: Pt continues to be deconditioned; has extensive HEP that she is compliant with.   Education / Equipment: HEP, posture/body mechanics  Plan: Patient agrees to discharge.  Patient goals were met. Patient is being  discharged due to meeting the stated rehab goals.  ?????    Laureen Abrahams, PT, DPT 11/01/2014 12:11 PM  [facility] [place] [place], [place] [address]  [phone] (office) [phone] (fax)

## 2014-11-10 ENCOUNTER — Ambulatory Visit (INDEPENDENT_AMBULATORY_CARE_PROVIDER_SITE_OTHER): Payer: 59 | Admitting: Licensed Clinical Social Worker

## 2014-11-10 ENCOUNTER — Telehealth (HOSPITAL_COMMUNITY): Payer: Self-pay | Admitting: *Deleted

## 2014-11-10 ENCOUNTER — Encounter (HOSPITAL_COMMUNITY): Payer: Self-pay | Admitting: Physician Assistant

## 2014-11-10 ENCOUNTER — Ambulatory Visit (INDEPENDENT_AMBULATORY_CARE_PROVIDER_SITE_OTHER): Payer: 59 | Admitting: Physician Assistant

## 2014-11-10 VITALS — BP 134/78 | HR 89 | Ht 69.0 in | Wt 290.0 lb

## 2014-11-10 DIAGNOSIS — F331 Major depressive disorder, recurrent, moderate: Secondary | ICD-10-CM | POA: Diagnosis not present

## 2014-11-10 DIAGNOSIS — G4700 Insomnia, unspecified: Secondary | ICD-10-CM

## 2014-11-10 DIAGNOSIS — F341 Dysthymic disorder: Secondary | ICD-10-CM

## 2014-11-10 MED ORDER — SERTRALINE HCL 50 MG PO TABS: 75.0000 mg | ORAL_TABLET | Freq: Every day | ORAL | Status: DC

## 2014-11-10 MED ORDER — SERTRALINE HCL 50 MG PO TABS
75.0000 mg | ORAL_TABLET | Freq: Every day | ORAL | Status: DC
Start: 2014-11-10 — End: 2014-12-14

## 2014-11-10 MED ORDER — HYDROXYZINE HCL 25 MG PO TABS: ORAL_TABLET | ORAL | Status: DC

## 2014-11-10 NOTE — Patient Instructions (Signed)
1. Take all of your medications as discussed with your provider. (Please check your AVS, for the list.)  2. Call this office for any questions or problems.  3. Be sure to get plenty of rest and try for 7-9 hours of quality sleep each night.  4. Try to get regular exercise, at least 15-30 minutes each day.  A good walk will help tremendously!  5. Remember to do your mindfulness each day, breath deeply in and out, while having quiet reflection, prayer, meditation, or positive visualization. Unplug and turn off all electronic devices each day for your own personal time without interruption. This works! There are studies to back this up!  6. Be sure to take your B complex and Vitamin D3 each day. This will improve your overall wellbeing and boost your immune system as well.  Evidence based medicine supports that taking these two supplements will improve your overall mental health.  Vitamin D3 (5000 i.u.) take one per day. B complex take one per day.    7. Try to eat a nutritious healthy diet and avoid excessive alcohol and ALL tobacco products.  8. Be sure to keep all of your appointments with your outpatient therapist. If you do not have one, our office will be happy to assist you with this.  9. Be sure to keep your next follow up appointment 1 month.

## 2014-11-10 NOTE — Progress Notes (Signed)
Patient ID: Kathryn Cline, female   DOB: 1976/03/20, 39 y.o.   MRN: [id number] [facility] MD Progress Note  11/10/2014 2:16 PM Kathryn Cline  MRN:  [id number] Subjective:  Kathryn Cline is in to follow up on her dysthymia and anxiety.She states she is doing pretty good. Kathryn Cline states she is better as she is more in control of her emotions and behaviors. She is less out of control, reports less sadness, fewer crying spells, she is more social, less isolation, she feels that she is coping with things better in general.  She says her anxiety is helped by the breathing exercises she is practicing with her therapist and she uses them when she feels the panic start.   She doesn't sleep well due to her sciatica, but is going to have injections in her hip next week.  Pain continues to be an issue for her. She stated her Vistaril for insomnia was too sedating even at a 1/2 dose. She also couldn't take her muscle relaxer with the vistaril.  Her appetite is so so. She is compliant in her medication.  Principal Problem: Depression Diagnosis:   Patient Active Problem List   Diagnosis Date Noted  . Dysthymia [F34.1] 11/10/2014  . Chronic low back pain [M54.5, G89.29] 08/23/2014  . Dehydration [E86.0] 06/26/2014  . Left-sided low back pain with left-sided sciatica [M54.42] 06/19/2014  . Severe obesity (BMI >= 40) [E66.01] 11/17/2013  . Depression [F32.9] 09/21/2013  . Osteoarthritis of both hips [M16.0] 08/25/2013  . Hypertriglyceridemia [E78.1] 08/24/2013  . Foot fracture, right [S92.901A] 08/22/2013  . Fibromyalgia [M79.7] 07/18/2013  . Neuropathy [G62.9] 07/18/2013  . Low back pain with radiation [M54.40] 07/18/2013  . Migraines [G43.909] 07/18/2013  . Atrophic vaginitis [N95.2] 02/26/2012    Past Medical History:  Past Medical History  Diagnosis Date  . Neuropathy   . Fibromyalgia   . GERD (gastroesophageal reflux disease)   . Allergy   . Hypertension     Past Surgical History  Procedure  Laterality Date  . Appendectomy    . Abdominal hysterectomy    . Wrist surgery    . Left foot surgery    . Cyst removal trunk    . Cholecystectomy     Family History:  Family History  Problem Relation Age of Onset  . Heart attack Brother   . Hypertension Brother   . Heart attack Maternal Grandmother   . Hypertension Maternal Grandmother   . Cancer Maternal Grandfather   . Heart attack Maternal Grandfather   . Hypertension Maternal Grandfather   . Cancer Paternal Grandmother   . Heart attack Paternal Grandmother   . Hypertension Paternal Grandmother   . Stroke Paternal Grandmother   . Heart attack Paternal Grandfather   . Hypertension Paternal Grandfather   . Hypertension Brother    Social History:  History  Alcohol Use No     History  Drug Use No    History   Social History  . Marital Status: Married    Spouse Name: N/A  . Number of Children: N/A  . Years of Education: N/A   Social History Main Topics  . Smoking status: Current Every Day Smoker  . Smokeless tobacco: Never Used  . Alcohol Use: No  . Drug Use: No  . Sexual Activity:    Partners: Male   Other Topics Concern  . None   Social History Narrative   Additional History:    Sleep: Poor  Appetite:  Poor   Assessment:   Musculoskeletal: Strength &  Muscle Tone: within normal limits Gait & Station: normal Patient leans: normal   Psychiatric Specialty Exam: Physical Exam  ROS  Blood pressure 134/78, pulse 89, height 5' 9"  (1.753 m), weight 290 lb (131.543 kg), SpO2 94 %.Body mass index is 42.81 kg/(m^2).  General Appearance: Fairly Groomed  Engineer, water::  Good  Speech:  Clear and Coherent  Volume:  Normal  Mood:  Anxious and Depressed  Affect:  Congruent  Thought Process:  Coherent, Goal Directed, Intact, Linear and Logical  Orientation:  Full (Time, Place, and Person)  Thought Content:  WDL  Suicidal Thoughts:  No  Homicidal Thoughts:  No  Memory:  Immediate;   Good Recent;    Good Remote;   Good  Judgement:  Intact  Insight:  Present  Psychomotor Activity:  Normal  Concentration:  Fair  Recall:  [place] of Knowledge:Good  Language: Good  Akathisia:  No  Handed:  Right  AIMS (if indicated):     Assets:  Communication Skills Desire for Improvement Financial Resources/Insurance [place] Talents/Skills Transportation Vocational/Educational  ADL's:  Intact  Cognition: WNL  Sleep:  Poor due to pain     Current Medications: Current Outpatient Prescriptions  Medication Sig Dispense Refill  . AMBULATORY NON FORMULARY MEDICATION Stress B-complex    . AMBULATORY NON FORMULARY MEDICATION Tens units   Dx: chronic pain, fibromyalgia 1 Device 0  . celecoxib (CELEBREX) 200 MG capsule Take 1 capsule (200 mg total) by mouth 2 (two) times daily. 60 capsule 5  . cetirizine (ZYRTEC) 10 MG tablet Take 10 mg by mouth daily.    . cholecalciferol (VITAMIN D) 1000 UNITS tablet Take 3,000 Units by mouth daily.    . diclofenac sodium (VOLTAREN) 1 % GEL Apply 4 g topically 4 (four) times daily. 2 Tube 3  . furosemide (LASIX) 40 MG tablet TAKE ONE TABLET BY MOUTH ONCE DAILY 30 tablet 0  . HYDROcodone-acetaminophen (NORCO/VICODIN) 5-325 MG per tablet Take 1 tablet by mouth every 8 (eight) hours as needed for moderate pain. 90 tablet 0  . hydrOXYzine (ATARAX/VISTARIL) 25 MG tablet Take one tablet at bedtime if needed for insomnia. 30 tablet 0  . lisinopril (PRINIVIL,ZESTRIL) 20 MG tablet TAKE ONE TABLET BY MOUTH ONCE DAILY.  PATIENT  NEEDS  TO  SCHEDULE  A  FOLLOW  UP  APPOINTMENT  BEFORE  MORE  REFILLS 30 tablet 0  . pantoprazole (PROTONIX) 40 MG tablet Take 1 tablet (40 mg total) by mouth 2 (two) times daily. 60 tablet 5  . promethazine (PHENERGAN) 25 MG tablet Take 1 tablet (25 mg total) by mouth every 6 (six) hours as needed for nausea or vomiting. 30 tablet 2  . rizatriptan (MAXALT) 5 MG tablet Take 5 mg by mouth as needed for  migraine. May repeat in 2 hours if needed    . sertraline (ZOLOFT) 50 MG tablet Take 1.5 tablets (75 mg total) by mouth daily. Take 1/2 tablet daily for 7 days, then increase to 1 full tablet daily. 45 tablet 0  . topiramate (TOPAMAX) 50 MG tablet Take 25 mg by mouth daily.     . cephALEXin (KEFLEX) 500 MG capsule Take 1 capsule (500 mg total) by mouth 4 (four) times daily. (Patient not taking: Reported on 09/15/2014) 28 capsule 0  . ciprofloxacin (CIPRO) 500 MG tablet Take 1 tablet (500 mg total) by mouth 2 (two) times daily. (Patient not taking: Reported on 09/15/2014) 6 tablet 0  . clonazePAM (KLONOPIN) 0.5  MG tablet Take 0.5-1 tablets (0.25-0.5 mg total) by mouth daily as needed for anxiety. (Patient not taking: Reported on 09/15/2014) 15 tablet 0   No current facility-administered medications for this visit.    Lab Results: No results found for this or any previous visit (from the past 48 hour(s)).  Physical Findings: AIMS:  CIWA:   COWS:    Treatment Plan Summary: Depression not yet treated to goal. Insomnia related to mental illness  Medical Decision Making:  Established Problem, Stable/Improving (1), Review of Psycho-Social Stressors (1) and New Problem, with no additional work-up planned (3)  Depression 1, Continue Zoloft to 75 mg po qd. 2. Continue Bcomplex and Vitamin D3  Insomnia 1. Try 1/2  Hydroxyzine pamoate 60m at hs and skip the muscle relaxer to see if this helps.  Follow up in 1 month. Rxs x 1 month.  NMarlane Hatcher Quianna Avery RPAC 2:27 PM 11/10/2014

## 2014-11-10 NOTE — Progress Notes (Signed)
THERAPIST PROGRESS NOTE  Session Time: 1:10pm-2:00pm  Participation Level: Active  Behavioral Response: AlertEuthymic   Type of Therapy: Individual Therapy  Treatment Goals addressed: Depression and anxiety  Interventions: CBT  Therapist Interventions: Had patient complete a PHQ-9 and GAD-7 to assess for depression and anxiety symptoms.   Explored some recent situations where patient communicated needs and feelingsin an effective manner.         Summary:  PHQ-9 score was 10. GAD-7 score was 4. The only symptom she indicated she has experienced nearly every day was trouble falling and staying asleep and she attributed this to her chronic pain. Expressed a belief that her medication is helping.  Also talked about practicing focused breathing at times when she realizes she is getting stressed.   Described several situations where she communicated with others in an assertive manner.  Noted that in the past she had a tendency to be more aggressive in her communication.  Reported family members have commented on how she has been handling things well.          Suicidal/Homicidal: Denied both    Plan: Will schedule next therapy appointment 3-4 weeks from now.     Diagnosis:Persistent Depressive Disorder with anxious distress      Garnette Scheuermann, LCSW

## 2014-11-10 NOTE — Telephone Encounter (Signed)
[facility] called for clarification about Zoloft prescription. Per Nena Polio, please resubmitted prescription with new direction. Directions were corrected and resubmitted to pharmacy.

## 2014-11-14 ENCOUNTER — Encounter: Payer: Self-pay | Admitting: Sports Medicine

## 2014-11-14 ENCOUNTER — Ambulatory Visit (INDEPENDENT_AMBULATORY_CARE_PROVIDER_SITE_OTHER): Payer: 59 | Admitting: Sports Medicine

## 2014-11-14 VITALS — BP 135/89 | HR 92 | Temp 97.7°F | Wt 288.0 lb

## 2014-11-14 DIAGNOSIS — N63 Unspecified lump in breast: Secondary | ICD-10-CM

## 2014-11-14 DIAGNOSIS — N6310 Unspecified lump in the right breast, unspecified quadrant: Secondary | ICD-10-CM | POA: Insufficient documentation

## 2014-11-14 DIAGNOSIS — L723 Sebaceous cyst: Secondary | ICD-10-CM

## 2014-11-14 DIAGNOSIS — M160 Bilateral primary osteoarthritis of hip: Secondary | ICD-10-CM | POA: Diagnosis not present

## 2014-11-14 NOTE — Progress Notes (Signed)
  Subjective:    CC: Bilateral hip osteoarthritis  HPI: Primary hip OA: Previous injection was a long time ago, good response, desires a repeat. Pain is moderate, persistent and localized to groin bilaterally, worse with weightbearing.  Breast mass: Noted on the right side with some pain. Desires examination and further workup, no nipple discharge, no constitutional symptoms.  Sebaceous cyst: Left-sided, desires excision, this has been excised once by another provider.  Past medical history, Surgical history, Family history not pertinant except as noted below, Social history, Allergies, and medications have been entered into the medical record, reviewed, and no changes needed.   Review of Systems: No fevers, chills, night sweats, weight loss, chest pain, or shortness of breath.   Objective:    General: Well Developed, well nourished, and in no acute distress.  Neuro: Alert and oriented x3, extra-ocular muscles intact, sensation grossly intact.  HEENT: Normocephalic, atraumatic, pupils equal round reactive to light, neck supple, no masses, no lymphadenopathy, thyroid nonpalpable.  Skin: Warm and dry, no rashes. Cardiac: Regular rate and rhythm, no murmurs rubs or gallops, no lower extremity edema.  Respiratory: Clear to auscultation bilaterally. Not using accessory muscles, speaking in full sentences. Breast: Bilateral breast and axilla exam conducted with chaperone, no peau d'orange, no nipple discharge, there were 2 masses, well-defined, movable, approximately 2 cm across, the first approximately 3 cm from the nipple at 3:00, and the second approximately 6 cm from the nipple approximately 1:00, in the right breast.  Procedure: Real-time Ultrasound Guided Injection of left femoral acetabular joint Device: GE Logiq E  Verbal informed consent obtained.  Time-out conducted.  Noted no overlying erythema, induration, or other signs of local infection.  Skin prepped in a sterile fashion.    Local anesthesia: Topical Ethyl chloride.  With sterile technique and under real time ultrasound guidance:  81m kenalog 40, 4 mL lidocaine injected easily Completed without difficulty  Pain immediately resolved suggesting accurate placement of the medication.  Advised to call if fevers/chills, erythema, induration, drainage, or persistent bleeding.  Images permanently stored and available for review in the ultrasound unit.  Impression: Technically successful ultrasound guided injection.  Procedure: Real-time Ultrasound Guided Injection of right femoral acetabular joint Device: GE Logiq E  Verbal informed consent obtained.  Time-out conducted.  Noted no overlying erythema, induration, or other signs of local infection.  Skin prepped in a sterile fashion.  Local anesthesia: Topical Ethyl chloride.  With sterile technique and under real time ultrasound guidance:  263mkenalog 40, 4 mL lidocaine injected easily Completed without difficulty  Pain immediately resolved suggesting accurate placement of the medication.  Advised to call if fevers/chills, erythema, induration, drainage, or persistent bleeding.  Images permanently stored and available for review in the ultrasound unit.  Impression: Technically successful ultrasound guided injection.  Impression and Recommendations:

## 2014-11-14 NOTE — Assessment & Plan Note (Signed)
Return for surgical excision.

## 2014-11-14 NOTE — Assessment & Plan Note (Signed)
Previous injection was one year ago. Repeat bilateral femoral acetabular joint injection as above.

## 2014-11-14 NOTE — Assessment & Plan Note (Signed)
There were a couple of cystlike structures in the right breast, tender, well-defined movable. Approximately 1cm across, one was at the 3:00 position approximately 3 cm from the nipple and another was at the 1:00 position approximately 6cm from the nipple. There were also a couple of similar cystlike structures in the left breast. Ultrasound and mammogram at the breast Center.

## 2014-11-21 ENCOUNTER — Ambulatory Visit
Admission: RE | Admit: 2014-11-21 | Discharge: 2014-11-21 | Disposition: A | Discharge status: Home / Self Care | Payer: 59 | Source: Ambulatory Visit | Attending: Sports Medicine | Admitting: Sports Medicine

## 2014-11-21 ENCOUNTER — Other Ambulatory Visit: Payer: Self-pay | Admitting: Physician Assistant

## 2014-11-21 ENCOUNTER — Other Ambulatory Visit: Payer: Self-pay

## 2014-11-21 DIAGNOSIS — N6310 Unspecified lump in the right breast, unspecified quadrant: Secondary | ICD-10-CM

## 2014-11-22 ENCOUNTER — Encounter: Payer: Self-pay | Admitting: Sports Medicine

## 2014-11-22 ENCOUNTER — Ambulatory Visit (INDEPENDENT_AMBULATORY_CARE_PROVIDER_SITE_OTHER): Payer: 59 | Admitting: Sports Medicine

## 2014-11-22 DIAGNOSIS — L723 Sebaceous cyst: Secondary | ICD-10-CM | POA: Diagnosis not present

## 2014-11-22 MED ORDER — HYDROCODONE-ACETAMINOPHEN 5-325 MG PO TABS: 1.0000 | ORAL_TABLET | Freq: Three times a day (TID) | ORAL | Status: DC | PRN

## 2014-11-22 NOTE — Progress Notes (Signed)
  Procedure:  Excision of  3.1 cm sebaceous cyst, left deltoid Risks, benefits, and alternatives explained and consent obtained. Time out conducted. Surface prepped with alcohol. 5cc lidocaine with epinephine infiltrated in a field block. Adequate anesthesia ensured. Area prepped and draped in a sterile fashion. Excision performed with: Using a #15 blade I made an elliptical incision around the area of concern, then using both sharp and blunt dissection I carried the incision under the sebaceous cyst, the cyst was removed en bloc, I then undermined the edges of the incision to decrease tension, and placed four 5-0 Ethilon simple interrupted sutures to close the incision. Hemostasis achieved. Pt stable.

## 2014-11-22 NOTE — Patient Instructions (Signed)
Incision Care °An incision is when a surgeon cuts into your body tissues. After surgery, the incision needs to be cared for properly to prevent infection.  °HOME CARE INSTRUCTIONS  °· Take all medicine as directed by your caregiver. Only take over-the-counter or prescription medicines for pain, discomfort, or fever as directed by your caregiver. °· Do not remove your bandage (dressing) or get your incision wet until your surgeon gives you permission. In the event that your dressing becomes wet, dirty, or starts to smell, change the dressing and call your surgeon for instructions as soon as possible. °· Take showers. Do not take tub baths, swim, or do anything that may soak the wound until it is healed. °· Resume your normal diet and activities as directed or allowed. °· Avoid lifting any weight until you are instructed otherwise. °· Use anti-itch antihistamine medicine as directed by your caregiver. The wound may itch when it is healing. Do not pick or scratch at the wound. °· Follow up with your caregiver for stitch (suture) or staple removal as directed. °· Drink enough fluids to keep your urine clear or pale yellow. °SEEK MEDICAL CARE IF:  °· You have redness, swelling, or increasing pain in the wound that is not controlled with medicine. °· You have drainage, blood, or pus coming from the wound that lasts longer than 1 day. °· You develop muscle aches, chills, or a general ill feeling. °· You notice a bad smell coming from the wound or dressing. °· Your wound edges separate after the sutures, staples, or skin adhesive strips have been removed. °· You develop persistent nausea or vomiting. °SEEK IMMEDIATE MEDICAL CARE IF:  °· You have a fever. °· You develop a rash. °· You develop dizzy episodes or faint while standing. °· You have difficulty breathing. °· You develop any reaction or side effects to medicine given. °MAKE SURE YOU:  °· Understand these instructions. °· Will watch your condition. °· Will get help  right away if you are not doing well or get worse. °Document Released: 11/08/2004 Document Revised: 07/14/2011 Document Reviewed: 06/15/2013 °[employer]® Patient Information ©2015 [employer]. This information is not intended to replace advice given to you by your health care provider. Make sure you discuss any questions you have with your health care provider. ° ° °

## 2014-11-22 NOTE — Assessment & Plan Note (Signed)
Surgical excision of left deltoid sebaceous cyst, with primary closure. Hydrocodone for pain, return in one week for suture removal and wound check.

## 2014-11-30 ENCOUNTER — Encounter: Payer: Self-pay | Admitting: Sports Medicine

## 2014-11-30 ENCOUNTER — Ambulatory Visit (INDEPENDENT_AMBULATORY_CARE_PROVIDER_SITE_OTHER): Payer: 59 | Admitting: Sports Medicine

## 2014-11-30 VITALS — BP 150/96 | HR 63 | Ht 69.0 in | Wt 284.0 lb

## 2014-11-30 DIAGNOSIS — L723 Sebaceous cyst: Secondary | ICD-10-CM

## 2014-11-30 NOTE — Assessment & Plan Note (Signed)
Healing well, return as needed

## 2014-11-30 NOTE — Progress Notes (Signed)
  Subjective: 1 week post surgical removal of a left deltoid sebaceous cyst, doing extremely well.   Objective: General: Well-developed, well-nourished, and in no acute distress. Left arm: Incision is clean, dry, intact, sutures removed and Dermabond placed.  Assessment/plan:

## 2014-12-14 ENCOUNTER — Encounter (HOSPITAL_COMMUNITY): Payer: Self-pay | Admitting: Medical

## 2014-12-14 ENCOUNTER — Ambulatory Visit (INDEPENDENT_AMBULATORY_CARE_PROVIDER_SITE_OTHER): Payer: 59 | Admitting: Medical

## 2014-12-14 VITALS — BP 116/64 | HR 92 | Ht 69.0 in | Wt 280.0 lb

## 2014-12-14 DIAGNOSIS — F331 Major depressive disorder, recurrent, moderate: Secondary | ICD-10-CM

## 2014-12-14 DIAGNOSIS — G4700 Insomnia, unspecified: Secondary | ICD-10-CM

## 2014-12-14 DIAGNOSIS — G894 Chronic pain syndrome: Secondary | ICD-10-CM

## 2014-12-14 DIAGNOSIS — F17200 Nicotine dependence, unspecified, uncomplicated: Secondary | ICD-10-CM

## 2014-12-14 DIAGNOSIS — F341 Dysthymic disorder: Secondary | ICD-10-CM

## 2014-12-14 DIAGNOSIS — M797 Fibromyalgia: Secondary | ICD-10-CM

## 2014-12-14 MED ORDER — SERTRALINE HCL 50 MG PO TABS: 75.0000 mg | ORAL_TABLET | Freq: Every day | ORAL | Status: DC

## 2014-12-14 NOTE — Progress Notes (Signed)
Patient ID: Kathryn Cline, female   DOB: [birth year], 39 y.o.   MRN: [id number] [facility] MD Progress Note  12/14/2014 12:27 PM Kathryn Cline  MRN:  [id number] Subjective:  Kathryn Cline is in to follow up on her dysthymia and anxiety.She states she is doing pretty well.Kathryn Cline states she is better as she is more in control of her emotions and behaviors. She reports less sadness, fewer crying spells, she is more social, less isolation, she feels that she is coping with things better in general.  She says her anxiety is helped by the breathing exercises she is practicing with her therapist and she uses them when she feels the panic start.   She doesn't sleep well due to her sciatica, but is going to have injections in her hip next week.  Chronic pain continues to be an issue for her. She stated her Vistaril for insomnia was too sedating even at a 1/2 dose so she discontinued it   Her appetite is so so. She is compliant in her medication.  Principal Problem: Depression Diagnosis:   Patient Active Problem List   Diagnosis Date Noted  . Chronic pain syndrome [G89.4] 12/14/2014  . Mass of breast, right [N63] 11/14/2014  . Sebaceous cyst left deltoid [L72.3] 11/14/2014  . Dysthymia [F34.1] 11/10/2014  . Chronic low back pain [M54.5, G89.29] 08/23/2014  . Dehydration [E86.0] 06/26/2014  . Left-sided low back pain with left-sided sciatica [M54.42] 06/19/2014  . Severe obesity (BMI >= 40) [E66.01] 11/17/2013  . Depression [F32.9] 09/21/2013  . Osteoarthritis of both hips [M16.0] 08/25/2013  . Hypertriglyceridemia [E78.1] 08/24/2013  . Foot fracture, right [S92.901A] 08/22/2013  . Fibromyalgia [M79.7] 07/18/2013  . Neuropathy [G62.9] 07/18/2013  . Migraines [G43.909] 07/18/2013  . Atrophic vaginitis [N95.2] 02/26/2012    Past Medical History:  Past Medical History  Diagnosis Date  . Neuropathy   . Fibromyalgia   . GERD (gastroesophageal reflux disease)   . Allergy   . Hypertension     Past  Surgical History  Procedure Laterality Date  . Appendectomy    . Abdominal hysterectomy    . Wrist surgery    . Left foot surgery    . Cyst removal trunk    . Cholecystectomy     Family History:  Family History  Problem Relation Age of Onset  . Heart attack Brother   . Hypertension Brother   . Heart attack Maternal Grandmother   . Hypertension Maternal Grandmother   . Cancer Maternal Grandfather   . Heart attack Maternal Grandfather   . Hypertension Maternal Grandfather   . Cancer Paternal Grandmother   . Heart attack Paternal Grandmother   . Hypertension Paternal Grandmother   . Stroke Paternal Grandmother   . Heart attack Paternal Grandfather   . Hypertension Paternal Grandfather   . Hypertension Brother    Social History:  History  Alcohol Use No     History  Drug Use No    Social History   Social History  . Marital Status: Married    Spouse Name: N/A  . Number of Children: N/A  . Years of Education: N/A   Social History Main Topics  . Smoking status: Current Every Day Smoker  . Smokeless tobacco: Never Used  . Alcohol Use: No  . Drug Use: No  . Sexual Activity:    Partners: Male   Other Topics Concern  . None   Social History Narrative   Additional History:    Sleep: Poor  Appetite:  Poor  Assessment:   Musculoskeletal: Strength & Muscle Tone: within normal limits Gait & Station: normal Patient leans: normal   Psychiatric Specialty Exam: Physical Exam  ROS  Blood pressure 116/64, pulse 92, height 5' 9"  (1.753 m), weight 280 lb (127.007 kg), SpO2 96 %.Body mass index is 41.33 kg/(m^2).  General Appearance: Fairly Groomed  Engineer, water::  Good  Speech:  Clear and Coherent  Volume:  Normal  Mood:  Anxious and Depressed  Affect:  Congruent  Thought Process:  Coherent, Goal Directed, Intact, Linear and Logical  Orientation:  Full (Time, Place, and Person)  Thought Content:  WDL  Suicidal Thoughts:  No  Homicidal Thoughts:  No   Memory:  Immediate;   Good Recent;   Good Remote;   Good  Judgement:  Intact  Insight:  Present  Psychomotor Activity:  Normal  Concentration:  Fair  Recall:  [place] of Knowledge:Good  Language: Good  Akathisia:  No  Handed:  Right  AIMS (if indicated):     Assets:  Communication Skills Desire for Improvement Financial Resources/Insurance [place] Talents/Skills Transportation Vocational/Educational  ADL's:  Intact  Cognition: WNL  Sleep:  Poor due to pain     Current Medications: Current Outpatient Prescriptions  Medication Sig Dispense Refill  . AMBULATORY NON FORMULARY MEDICATION Stress B-complex    . AMBULATORY NON FORMULARY MEDICATION Tens units   Dx: chronic pain, fibromyalgia 1 Device 0  . celecoxib (CELEBREX) 200 MG capsule Take 1 capsule (200 mg total) by mouth 2 (two) times daily. 60 capsule 5  . cetirizine (ZYRTEC) 10 MG tablet Take 10 mg by mouth daily.    . cholecalciferol (VITAMIN D) 1000 UNITS tablet Take 3,000 Units by mouth daily.    . clonazePAM (KLONOPIN) 0.5 MG tablet Take 0.5-1 tablets (0.25-0.5 mg total) by mouth daily as needed for anxiety. 15 tablet 0  . diclofenac sodium (VOLTAREN) 1 % GEL Apply 4 g topically 4 (four) times daily. 2 Tube 3  . furosemide (LASIX) 40 MG tablet TAKE ONE TABLET BY MOUTH ONCE DAILY 30 tablet 3  . HYDROcodone-acetaminophen (NORCO/VICODIN) 5-325 MG per tablet Take 1 tablet by mouth every 8 (eight) hours as needed for moderate pain. 10 tablet 0  . lisinopril (PRINIVIL,ZESTRIL) 20 MG tablet TAKE ONE TABLET BY MOUTH ONCE DAILY.  PATIENT  NEEDS  TO  SCHEDULE  A  FOLLOW  UP  APPOINTMENT  BEFORE  MORE  REFILLS 30 tablet 0  . pantoprazole (PROTONIX) 40 MG tablet Take 1 tablet (40 mg total) by mouth 2 (two) times daily. 60 tablet 5  . promethazine (PHENERGAN) 25 MG tablet Take 1 tablet (25 mg total) by mouth every 6 (six) hours as needed for nausea or vomiting. 30 tablet 2  .  rizatriptan (MAXALT) 5 MG tablet Take 5 mg by mouth as needed for migraine. May repeat in 2 hours if needed    . sertraline (ZOLOFT) 50 MG tablet Take 1.5 tablets (75 mg total) by mouth daily. 45 tablet 1  . topiramate (TOPAMAX) 50 MG tablet Take 25 mg by mouth daily.      No current facility-administered medications for this visit.    Lab Results: No results found for this or any previous visit (from the past 48 hour(s)).  Physical Findings:NA AIMS:  CIWA:   COWS:    Treatment Plan Summary: Depression  to goal. Insomnia related to mental illness  Medical Decision Making:  Established Problem, Stable/Improving (1), Review of Psycho-Social Stressors (  1) and New Problem, with no additional work-up planned (3)  Depression 1, Continue Zoloft to 75 mg po qd. 2. Continue Bcomplex and Vitamin D3  Insomnia 1. Try 1/2  Hydroxyzine pamoate 64m at hs and skip the muscle relaxer to see if this helps.  Follow up in 2 mEl PortalPA 12:27 PM 12/14/2014

## 2014-12-22 ENCOUNTER — Other Ambulatory Visit: Payer: Self-pay | Admitting: Family Medicine

## 2014-12-28 ENCOUNTER — Other Ambulatory Visit (HOSPITAL_COMMUNITY): Payer: Self-pay | Admitting: Medical

## 2014-12-28 ENCOUNTER — Telehealth (HOSPITAL_COMMUNITY): Payer: Self-pay | Admitting: *Deleted

## 2014-12-28 MED ORDER — TRAZODONE HCL 50 MG PO TABS: 50.0000 mg | ORAL_TABLET | ORAL | Status: DC

## 2014-12-28 NOTE — Telephone Encounter (Signed)
Pt states she had a dead in her family and her medications are not working. Pt would like to speak with you about adding a medication to help her sleep. Please call to advise at [phone].

## 2014-12-28 NOTE — Telephone Encounter (Signed)
rx for Trazodone sent 50 mg 1 HS PRN may repeat dose if needed

## 2015-01-18 ENCOUNTER — Ambulatory Visit (INDEPENDENT_AMBULATORY_CARE_PROVIDER_SITE_OTHER): Payer: 59 | Admitting: Sports Medicine

## 2015-01-18 ENCOUNTER — Ambulatory Visit (INDEPENDENT_AMBULATORY_CARE_PROVIDER_SITE_OTHER): Payer: 59 | Admitting: Licensed Clinical Social Worker

## 2015-01-18 ENCOUNTER — Encounter: Payer: Self-pay | Admitting: Sports Medicine

## 2015-01-18 VITALS — BP 150/88 | HR 100 | Temp 98.2°F | Ht 69.0 in | Wt 286.0 lb

## 2015-01-18 DIAGNOSIS — J0100 Acute maxillary sinusitis, unspecified: Secondary | ICD-10-CM | POA: Insufficient documentation

## 2015-01-18 DIAGNOSIS — F341 Dysthymic disorder: Secondary | ICD-10-CM

## 2015-01-18 MED ORDER — DOXYCYCLINE HYCLATE 100 MG PO TABS: 100.0000 mg | ORAL_TABLET | Freq: Two times a day (BID) | ORAL | Status: AC

## 2015-01-18 MED ORDER — FLUTICASONE PROPIONATE 50 MCG/ACT NA SUSP: NASAL | Status: DC

## 2015-01-18 NOTE — Progress Notes (Signed)
  Subjective:    CC: Sinus infection  HPI: For the past week and a half this pleasant 39 year old female has had increasing pain and pressure behind her frontal and maxillary sinuses with radiation to the, purulent nasal discharge without fevers or chills. A moderate, persistent without radiation.  Bilateral hip osteoarthritis: Wonders if we can do another injection today, her previous injection was only 2 months ago. Advised to wait at least another month.  Past medical history, Surgical history, Family history not pertinant except as noted below, Social history, Allergies, and medications have been entered into the medical record, reviewed, and no changes needed.   Review of Systems: No fevers, chills, night sweats, weight loss, chest pain, or shortness of breath.   Objective:    General: Well Developed, well nourished, and in no acute distress.  Neuro: Alert and oriented x3, extra-ocular muscles intact, sensation grossly intact.  HEENT: Normocephalic, atraumatic, pupils equal round reactive to light, neck supple, no masses, no lymphadenopathy, thyroid nonpalpable. Boggy and erythematous turbinates with some yellowish discharge in the nasal canals, oropharynx and nasopharynx are unremarkable, there is tenderness to palpation and percussion over the maxillary sinuses. Skin: Warm and dry, no rashes. Cardiac: Regular rate and rhythm, no murmurs rubs or gallops, no lower extremity edema.  Respiratory: Clear to auscultation bilaterally. Not using accessory muscles, speaking in full sentences.  Impression and Recommendations:    I spent 25 minutes with this patient, greater than 50% was face-to-face time counseling regarding the above diagnoses

## 2015-01-18 NOTE — Assessment & Plan Note (Signed)
Doxycycline, Flonase.

## 2015-01-18 NOTE — Progress Notes (Signed)
THERAPIST PROGRESS NOTE  Session Time: 1:10pm-1:55pm  Participation Level: Active  Behavioral Response:Casual Drowsy Depressed  Walked slowly and appeared to be in pain    Type of Therapy: Individual Therapy  Treatment Goals addressed: Depression and anxiety  Interventions: Psycho-ed about grief and loss  Therapist Interventions:  Explored how patient has been coping with the death of her grandmother.   Normalized the variety of emotions she has been experiencing.  Encouraged her to accept that those painful emotions are going to be there at times.   Encouraged her to pace herself with tasks associated with preparing to move into her grandmother's house rather than try to get things done as quickly as possible.     Summary:  Described how symptoms of depression have worsened since grandmother's death on 01-17-2023.  Insomnia has become one of her primary concerns.  Noted that prior to the death her mood had been "pretty good."    Reported that family has been supportive and this has helped her to cope more effectively.     Talked about how she feels her grandmother's presence at times.  Finds this comforting.          Suicidal/Homicidal: Denied both    Plan: Encouraged patient to schedule next therapy session in 3-4 weeks.  Diagnosis:Persistent Depressive Disorder with anxious distress      Armandina Stammer 01/18/15

## 2015-01-21 ENCOUNTER — Emergency Department (HOSPITAL_BASED_OUTPATIENT_CLINIC_OR_DEPARTMENT_OTHER): Payer: 59

## 2015-01-21 ENCOUNTER — Encounter (HOSPITAL_BASED_OUTPATIENT_CLINIC_OR_DEPARTMENT_OTHER): Payer: Self-pay

## 2015-01-21 ENCOUNTER — Emergency Department (HOSPITAL_BASED_OUTPATIENT_CLINIC_OR_DEPARTMENT_OTHER)
Admission: EM | Admit: 2015-01-21 | Discharge: 2015-01-21 | Disposition: A | Discharge status: Home / Self Care | Payer: 59 | Attending: Emergency Medicine | Admitting: Emergency Medicine

## 2015-01-21 DIAGNOSIS — J40 Bronchitis, not specified as acute or chronic: Secondary | ICD-10-CM

## 2015-01-21 DIAGNOSIS — K219 Gastro-esophageal reflux disease without esophagitis: Secondary | ICD-10-CM | POA: Diagnosis not present

## 2015-01-21 DIAGNOSIS — Z7951 Long term (current) use of inhaled steroids: Secondary | ICD-10-CM | POA: Diagnosis not present

## 2015-01-21 DIAGNOSIS — Z79899 Other long term (current) drug therapy: Secondary | ICD-10-CM | POA: Diagnosis not present

## 2015-01-21 DIAGNOSIS — R0981 Nasal congestion: Secondary | ICD-10-CM | POA: Diagnosis present

## 2015-01-21 DIAGNOSIS — J209 Acute bronchitis, unspecified: Secondary | ICD-10-CM | POA: Insufficient documentation

## 2015-01-21 DIAGNOSIS — R51 Headache: Secondary | ICD-10-CM | POA: Diagnosis not present

## 2015-01-21 DIAGNOSIS — J069 Acute upper respiratory infection, unspecified: Secondary | ICD-10-CM | POA: Diagnosis not present

## 2015-01-21 DIAGNOSIS — M797 Fibromyalgia: Secondary | ICD-10-CM | POA: Insufficient documentation

## 2015-01-21 DIAGNOSIS — I10 Essential (primary) hypertension: Secondary | ICD-10-CM | POA: Insufficient documentation

## 2015-01-21 DIAGNOSIS — H9209 Otalgia, unspecified ear: Secondary | ICD-10-CM | POA: Insufficient documentation

## 2015-01-21 MED ORDER — PREDNISONE 10 MG PO TABS: ORAL_TABLET | ORAL | Status: DC

## 2015-01-21 MED ORDER — PREDNISONE 50 MG PO TABS
60.0000 mg | ORAL_TABLET | Freq: Once | ORAL | Status: AC
  Administered 2015-01-21: 60 mg via ORAL
  Filled 2015-01-21 (×2): qty 1

## 2015-01-21 MED ORDER — IPRATROPIUM-ALBUTEROL 0.5-2.5 (3) MG/3ML IN SOLN
RESPIRATORY_TRACT | Status: AC
  Administered 2015-01-21: 3 mL via RESPIRATORY_TRACT
  Filled 2015-01-21: qty 3

## 2015-01-21 MED ORDER — IPRATROPIUM-ALBUTEROL 0.5-2.5 (3) MG/3ML IN SOLN
3.0000 mL | Freq: Four times a day (QID) | RESPIRATORY_TRACT | Status: DC
  Administered 2015-01-21 (×3): 3 mL via RESPIRATORY_TRACT
  Filled 2015-01-21: qty 3

## 2015-01-21 MED ORDER — GUAIFENESIN-CODEINE 100-10 MG/5ML PO SOLN: 5.0000 mL | ORAL | Status: DC | PRN

## 2015-01-21 MED ORDER — IPRATROPIUM-ALBUTEROL 0.5-2.5 (3) MG/3ML IN SOLN
3.0000 mL | Freq: Four times a day (QID) | RESPIRATORY_TRACT | Status: DC
  Filled 2015-01-21: qty 3

## 2015-01-21 MED ORDER — ALBUTEROL SULFATE (2.5 MG/3ML) 0.083% IN NEBU
INHALATION_SOLUTION | RESPIRATORY_TRACT | Status: AC
  Administered 2015-01-21: 2.5 mg
  Filled 2015-01-21: qty 3

## 2015-01-21 NOTE — ED Notes (Signed)
Patient here with ongoing congestion and sinus pressure. Diagnosed with sinus infection Thursday and taking flonase and doxycycline as prescribed

## 2015-01-21 NOTE — Discharge Instructions (Signed)
Continue your current medications. Take prednisone as prescribed until all gone with next dose tomorrow. Take cough medication as prescribed as needed. Follow-up with primary care doctor.  Acute Bronchitis Bronchitis is inflammation of the airways that extend from the windpipe into the lungs (bronchi). The inflammation often causes mucus to develop. This leads to a cough, which is the most common symptom of bronchitis.  In acute bronchitis, the condition usually develops suddenly and goes away over time, usually in a couple weeks. Smoking, allergies, and asthma can make bronchitis worse. Repeated episodes of bronchitis may cause further lung problems.  CAUSES Acute bronchitis is most often caused by the same virus that causes a cold. The virus can spread from person to person (contagious) through coughing, sneezing, and touching contaminated objects. SIGNS AND SYMPTOMS   Cough.   Fever.   Coughing up mucus.   Body aches.   Chest congestion.   Chills.   Shortness of breath.   Sore throat.  DIAGNOSIS  Acute bronchitis is usually diagnosed through a physical exam. Your health care provider will also ask you questions about your medical history. Tests, such as chest X-rays, are sometimes done to rule out other conditions.  TREATMENT  Acute bronchitis usually goes away in a couple weeks. Oftentimes, no medical treatment is necessary. Medicines are sometimes given for relief of fever or cough. Antibiotic medicines are usually not needed but may be prescribed in certain situations. In some cases, an inhaler may be recommended to help reduce shortness of breath and control the cough. A cool mist vaporizer may also be used to help thin bronchial secretions and make it easier to clear the chest.  HOME CARE INSTRUCTIONS  Get plenty of rest.   Drink enough fluids to keep your urine clear or pale yellow (unless you have a medical condition that requires fluid restriction). Increasing  fluids may help thin your respiratory secretions (sputum) and reduce chest congestion, and it will prevent dehydration.   Take medicines only as directed by your health care provider.  If you were prescribed an antibiotic medicine, finish it all even if you start to feel better.  Avoid smoking and secondhand smoke. Exposure to cigarette smoke or irritating chemicals will make bronchitis worse. If you are a smoker, consider using nicotine gum or skin patches to help control withdrawal symptoms. Quitting smoking will help your lungs heal faster.   Reduce the chances of another bout of acute bronchitis by washing your hands frequently, avoiding people with cold symptoms, and trying not to touch your hands to your mouth, nose, or eyes.   Keep all follow-up visits as directed by your health care provider.  SEEK MEDICAL CARE IF: Your symptoms do not improve after 1 week of treatment.  SEEK IMMEDIATE MEDICAL CARE IF:  You develop an increased fever or chills.   You have chest pain.   You have severe shortness of breath.  You have bloody sputum.   You develop dehydration.  You faint or repeatedly feel like you are going to pass out.  You develop repeated vomiting.  You develop a severe headache. MAKE SURE YOU:   Understand these instructions.  Will watch your condition.  Will get help right away if you are not doing well or get worse. Document Released: 05/29/2004 Document Revised: 09/05/2013 Document Reviewed: 10/12/2012 [facility] Patient Information 2015 [place], [place]. This information is not intended to replace advice given to you by your health care provider. Make sure you discuss any questions you have with  your health care provider.

## 2015-01-21 NOTE — ED Provider Notes (Signed)
CSN: [id number]     Arrival date & time 01/21/15  1229 History   First MD Initiated Contact with Patient 01/21/15 1318     Chief Complaint  Patient presents with  . Nasal Congestion     (Consider location/radiation/quality/duration/timing/severity/associated sxs/prior Treatment) HPI Kathryn Cline is a 39 y.o. female with history of hypertension, neuropathy, fibromyalgia, presents to emergency department complaining of nasal congestion, cough, generalized malaise. Patient states her symptoms started 5 days ago. She was seen 3 days ago by her PCP, was told she had sinus infection and was started on Flonase and on doxycycline. Patient states she has been taking doxycycline with no improvement of her symptoms. She states in fact her cough has gotten worse and she feels short of breath. She reports history of asthma and reports wheezing. She has been taking her inhaler every 4 hours and sometimes sooner than not for wheezing and shortness of breath. She states her cough is productive with white thick sputum. Denies any fever or chills. Denies any neck pain or stiffness. No nausea or vomiting. No diarrhea. No abdominal pain.  Past Medical History  Diagnosis Date  . Neuropathy   . Fibromyalgia   . GERD (gastroesophageal reflux disease)   . Allergy   . Hypertension    Past Surgical History  Procedure Laterality Date  . Appendectomy    . Abdominal hysterectomy    . Wrist surgery    . Left foot surgery    . Cyst removal trunk    . Cholecystectomy     Family History  Problem Relation Age of Onset  . Heart attack Brother   . Hypertension Brother   . Heart attack Maternal Grandmother   . Hypertension Maternal Grandmother   . Cancer Maternal Grandfather   . Heart attack Maternal Grandfather   . Hypertension Maternal Grandfather   . Cancer Paternal Grandmother   . Heart attack Paternal Grandmother   . Hypertension Paternal Grandmother   . Stroke Paternal Grandmother   . Heart attack  Paternal Grandfather   . Hypertension Paternal Grandfather   . Hypertension Brother    Social History  Substance Use Topics  . Smoking status: Current Every Day Smoker  . Smokeless tobacco: Never Used  . Alcohol Use: No   OB History    No data available     Review of Systems  Constitutional: Negative for fever and chills.  HENT: Positive for congestion, ear pain, sinus pressure and sore throat. Negative for facial swelling.   Respiratory: Positive for cough, shortness of breath and wheezing. Negative for chest tightness.   Cardiovascular: Negative for chest pain, palpitations and leg swelling.  Gastrointestinal: Negative for nausea, vomiting, abdominal pain and diarrhea.  Genitourinary: Negative for dysuria, flank pain and pelvic pain.  Musculoskeletal: Negative for myalgias, arthralgias, neck pain and neck stiffness.  Skin: Negative for rash.  Neurological: Positive for headaches. Negative for dizziness and weakness.  All other systems reviewed and are negative.     Allergies  Oxycodone-acetaminophen; Vicodin; Viibryd; Amitriptyline; Brintellix; Bupropion hcl; Lyrica; Azithromycin; Bupropion; Gabapentin; Chlordiazepoxide-amitriptyline; and Effexor  Home Medications   Prior to Admission medications   Medication Sig Start Date End Date Taking? Authorizing Provider  AMBULATORY NON FORMULARY MEDICATION Stress B-complex    Historical Provider, MD  AMBULATORY NON FORMULARY MEDICATION Tens units   Dx: chronic pain, fibromyalgia 02/03/14   [name] [name], PA-C  celecoxib (CELEBREX) 200 MG capsule Take 1 capsule (200 mg total) by mouth 2 (two) times daily. 08/08/14  [name] [name], PA-C  cetirizine (ZYRTEC) 10 MG tablet Take 10 mg by mouth daily.    Historical Provider, MD  cholecalciferol (VITAMIN D) 1000 UNITS tablet Take 3,000 Units by mouth daily.    Historical Provider, MD  clonazePAM (KLONOPIN) 0.5 MG tablet Take 0.5-1 tablets (0.25-0.5 mg total) by mouth daily as needed for  anxiety. 05/04/14   Hali Marry, MD  diclofenac sodium (VOLTAREN) 1 % GEL Apply 4 g topically 4 (four) times daily. 08/19/13   [name] [name], PA-C  doxycycline (VIBRA-TABS) 100 MG tablet Take 1 tablet (100 mg total) by mouth 2 (two) times daily. 01/18/15 01/25/15  [name], MD  fluticasone (FLONASE) 50 MCG/ACT nasal spray One spray in each nostril twice a day, use left hand for right nostril, and right hand for left nostril. 01/18/15   [name], MD  furosemide (LASIX) 40 MG tablet TAKE ONE TABLET BY MOUTH ONCE DAILY 11/22/14   Donella Stade, PA-C  HYDROcodone-acetaminophen (NORCO/VICODIN) 5-325 MG per tablet Take 1 tablet by mouth every 8 (eight) hours as needed for moderate pain. 11/22/14   [name], MD  lisinopril (PRINIVIL,ZESTRIL) 20 MG tablet TAKE ONE TABLET BY MOUTH ONCE DAILY, NEEDS A FOLLOW UP APPOINTMENT BEFORE MORE REFILLS 12/22/14   Hali Marry, MD  pantoprazole (PROTONIX) 40 MG tablet Take 1 tablet (40 mg total) by mouth 2 (two) times daily. 06/09/14   Donella Stade, PA-C  promethazine (PHENERGAN) 25 MG tablet Take 1 tablet (25 mg total) by mouth every 6 (six) hours as needed for nausea or vomiting. 06/26/14   [name], MD  rizatriptan (MAXALT) 5 MG tablet Take 5 mg by mouth as needed for migraine. May repeat in 2 hours if needed    Historical Provider, MD  sertraline (ZOLOFT) 50 MG tablet Take 1.5 tablets (75 mg total) by mouth daily. 12/14/14   [name], PA-C  topiramate (TOPAMAX) 50 MG tablet Take 25 mg by mouth daily.     Historical Provider, MD  traZODone (DESYREL) 50 MG tablet Take 1 tablet (50 mg total) by mouth 60 (sixty) minutes before procedure. 12/28/14   [name], PA-C   BP 126/79 mmHg  Pulse 101  Temp(Src) 98.4 F (36.9 C) (Oral)  Resp 20  Ht 5' 9"  (1.753 m)  Wt 280 lb (127.007 kg)  BMI 41.33 kg/m2  SpO2 98% Physical Exam  Constitutional: She is oriented to person, place, and time. She  appears well-developed and well-nourished. No distress.  HENT:  Head: Normocephalic.  Right Ear: Tympanic membrane, external ear and ear canal normal.  Left Ear: Tympanic membrane, external ear and ear canal normal.  Nose: Mucosal edema and rhinorrhea present. Right sinus exhibits maxillary sinus tenderness and frontal sinus tenderness. Left sinus exhibits maxillary sinus tenderness and frontal sinus tenderness.  Mouth/Throat: Uvula is midline, oropharynx is clear and moist and mucous membranes are normal.  Eyes: Conjunctivae are normal.  Neck: Normal range of motion. Neck supple.  No meningismus  Cardiovascular: Normal rate, regular rhythm and normal heart sounds.   Pulmonary/Chest: Effort normal. No respiratory distress. She has wheezes. She has no rales.  Expiratory wheezes in all lung fields  Abdominal: Soft. Bowel sounds are normal. She exhibits no distension. There is no tenderness. There is no rebound.  Musculoskeletal: She exhibits no edema.  Neurological: She is alert and oriented to person, place, and time.  Skin: Skin is warm and dry.  Psychiatric: She has a normal  mood and affect. Her behavior is normal.  Nursing note and vitals reviewed.   ED Course  Procedures (including critical care time) Labs Review Labs Reviewed - No data to display  Imaging Review Dg Chest 2 View  01/21/2015   CLINICAL DATA:  Productive cough congestion and sinus pressure for 5 days.  EXAM: CHEST  2 VIEW  COMPARISON:  None.  FINDINGS: Cardiomediastinal silhouette is normal. Mediastinal contours appear intact.  There is no evidence of focal airspace consolidation, pleural effusion or pneumothorax.  Osseous structures are without acute abnormality. Soft tissues are grossly normal.  IMPRESSION: No radiographic evidence of acute cardiopulmonary abnormality.   Electronically Signed   By: Fidela Salisbury M.D.   On: 01/21/2015 14:26   I have personally reviewed and evaluated these images and lab results  as part of my medical decision-making.   EKG Interpretation None      MDM   Final diagnoses:  Bronchitis  URI (upper respiratory infection)   Patient emergency department with cough and congestion was started 5 days ago. Patient was started on doxycycline and Flonase 3 days ago which she is currently taking. She is here because her cough is getting worse and she unable to sleep at night. Will get chest x-ray, patient is wheezing, breathing treatment ordered, prednisone ordered. Vital signs are normal.   3:25 PM Chest x-ray is negative. Patient received 3 treatments and is feeling much better. Wheezing almost resolved. I discussed with her smoking cessation., Prednisone taper, Vicodin, follow-up as needed. Continue antibiotics.  Filed Vitals:   01/21/15 1237 01/21/15 1321 01/21/15 1417 01/21/15 1458  BP: 126/79     Pulse: 101     Temp: 98.4 F (36.9 C)     TempSrc: Oral     Resp: 20     Height: 5' 9"  (1.753 m)     Weight: 280 lb (127.007 kg)     SpO2: 95% 98% 95% 94%     Jeannett Senior, PA-C 01/21/15 [place], MD 01/24/15 1623

## 2015-01-26 ENCOUNTER — Encounter: Payer: Self-pay | Admitting: Physician Assistant

## 2015-01-26 ENCOUNTER — Ambulatory Visit (INDEPENDENT_AMBULATORY_CARE_PROVIDER_SITE_OTHER): Payer: 59 | Admitting: Physician Assistant

## 2015-01-26 VITALS — BP 138/82 | HR 92 | Ht 69.0 in | Wt 290.0 lb

## 2015-01-26 DIAGNOSIS — J45901 Unspecified asthma with (acute) exacerbation: Secondary | ICD-10-CM | POA: Insufficient documentation

## 2015-01-26 DIAGNOSIS — J4541 Moderate persistent asthma with (acute) exacerbation: Secondary | ICD-10-CM

## 2015-01-26 DIAGNOSIS — J209 Acute bronchitis, unspecified: Secondary | ICD-10-CM | POA: Insufficient documentation

## 2015-01-26 MED ORDER — LEVOFLOXACIN 750 MG PO TABS: 750.0000 mg | ORAL_TABLET | Freq: Every day | ORAL | Status: DC

## 2015-01-26 MED ORDER — CEFTRIAXONE SODIUM 1 G IJ SOLR
1.0000 g | Freq: Once | INTRAMUSCULAR | Status: AC
  Administered 2015-01-26: 1 g via INTRAMUSCULAR

## 2015-01-26 MED ORDER — METHYLPREDNISOLONE ACETATE 80 MG/ML IJ SUSP
80.0000 mg | Freq: Once | INTRAMUSCULAR | Status: AC
  Administered 2015-01-26: 80 mg via INTRAMUSCULAR

## 2015-01-26 MED ORDER — IPRATROPIUM-ALBUTEROL 0.5-2.5 (3) MG/3ML IN SOLN
3.0000 mL | Freq: Four times a day (QID) | RESPIRATORY_TRACT | Status: DC
  Administered 2015-01-26: 3 mL via RESPIRATORY_TRACT

## 2015-01-26 MED ORDER — FLUCONAZOLE 200 MG PO TABS: 200.0000 mg | ORAL_TABLET | Freq: Once | ORAL | Status: AC

## 2015-01-26 MED ORDER — IPRATROPIUM-ALBUTEROL 0.5-2.5 (3) MG/3ML IN SOLN: 3.0000 mL | RESPIRATORY_TRACT | Status: DC | PRN

## 2015-01-26 NOTE — Patient Instructions (Signed)
Start levaquin tomorrow.  duoneb every 4-6 hours for wheezing.  Follow up on Monday.

## 2015-01-26 NOTE — Progress Notes (Addendum)
Kathryn Cline is a 39 y.o. female who presents to [place]: Primary Care today for hospital follow up. She was seen on 9/15 for a sinus infection, she was treated with 7 days of doxycyline and intranasal fluticasone. She was seen at [facility] ED on 01/21/2015 for an asthma exacerbation, she was treated with a duoneb treatment and prednisone taper. She states that she has taken all medications as directed, and has had no improvement in her symptoms. She has also been using her albuterol inhaler, which has provided some relief. She is currently experiencing sore throat, sob, orthopnea, and productive cough. These symptoms are accompanied by chills, neck stiffness, rhinorrhea, and sinus congestion. She states that sputum is yellow/green but sometimes white. She denies chest pain, fever, myalgias, N/V/D, and abdominal pain.    Past Medical History  Diagnosis Date  . Neuropathy   . Fibromyalgia   . GERD (gastroesophageal reflux disease)   . Allergy   . Hypertension    Past Surgical History  Procedure Laterality Date  . Appendectomy    . Abdominal hysterectomy    . Wrist surgery    . Left foot surgery    . Cyst removal trunk    . Cholecystectomy     Social History  Substance Use Topics  . Smoking status: Current Every Day Smoker  . Smokeless tobacco: Never Used  . Alcohol Use: No   family history includes Cancer in her maternal grandfather and paternal grandmother; Heart attack in her brother, maternal grandfather, maternal grandmother, paternal grandfather, and paternal grandmother; Hypertension in her brother, brother, maternal grandfather, maternal grandmother, paternal grandfather, and paternal grandmother; Stroke in her paternal grandmother.  ROS as above Medications: Current Outpatient Prescriptions  Medication Sig Dispense Refill  . AMBULATORY NON FORMULARY MEDICATION Stress B-complex    . AMBULATORY NON FORMULARY MEDICATION Tens units    Dx: chronic pain, fibromyalgia 1 Device 0  . celecoxib (CELEBREX) 200 MG capsule Take 1 capsule (200 mg total) by mouth 2 (two) times daily. 60 capsule 5  . cetirizine (ZYRTEC) 10 MG tablet Take 10 mg by mouth daily.    . cholecalciferol (VITAMIN D) 1000 UNITS tablet Take 3,000 Units by mouth daily.    . clonazePAM (KLONOPIN) 0.5 MG tablet Take 0.5-1 tablets (0.25-0.5 mg total) by mouth daily as needed for anxiety. 15 tablet 0  . diclofenac sodium (VOLTAREN) 1 % GEL Apply 4 g topically 4 (four) times daily. 2 Tube 3  . fluticasone (FLONASE) 50 MCG/ACT nasal spray One spray in each nostril twice a day, use left hand for right nostril, and right hand for left nostril. 48 g 3  . furosemide (LASIX) 40 MG tablet TAKE ONE TABLET BY MOUTH ONCE DAILY 30 tablet 3  . HYDROcodone-acetaminophen (NORCO/VICODIN) 5-325 MG per tablet Take 1 tablet by mouth every 8 (eight) hours as needed for moderate pain. 10 tablet 0  . lisinopril (PRINIVIL,ZESTRIL) 20 MG tablet TAKE ONE TABLET BY MOUTH ONCE DAILY, NEEDS A FOLLOW UP APPOINTMENT BEFORE MORE REFILLS 30 tablet 0  . pantoprazole (PROTONIX) 40 MG tablet Take 1 tablet (40 mg total) by mouth 2 (two) times daily. 60 tablet 5  . promethazine (PHENERGAN) 25 MG tablet Take 1 tablet (25 mg total) by mouth every 6 (six) hours as needed for nausea or vomiting. 30 tablet 2  . rizatriptan (MAXALT) 5 MG tablet Take 5 mg by mouth as needed for migraine. May repeat in 2 hours if needed    .  sertraline (ZOLOFT) 50 MG tablet Take 1.5 tablets (75 mg total) by mouth daily. 45 tablet 1  . topiramate (TOPAMAX) 50 MG tablet Take 25 mg by mouth daily.     . traZODone (DESYREL) 50 MG tablet Take 1 tablet (50 mg total) by mouth 60 (sixty) minutes before procedure. 60 tablet 1   No current facility-administered medications for this visit.   Allergies  Allergen Reactions  . Oxycodone-Acetaminophen Itching  . Vicodin [Hydrocodone-Acetaminophen] Nausea And Vomiting and Itching  .  Viibryd [Vilazodone Hcl] Shortness Of Breath and Swelling  . Amitriptyline Other (See Comments) and Rash    Pt stated she felt like she was watching herself from outside her body Pt stated she felt like she was watching herself from outside her body  . Brintellix [Vortioxetine] Other (See Comments), Nausea Only and Nausea And Vomiting    vomiting vomiting vomiting vomiting  . Bupropion Hcl Other (See Comments)    headache  . Lyrica [Pregabalin] Swelling  . Azithromycin Other (See Comments)    Slow heart rate bradycardia  . Bupropion Other (See Comments)    migraines  . Gabapentin Other (See Comments)    Spaced out sleepiness  . Chlordiazepoxide-Amitriptyline Nausea Only  . Effexor [Venlafaxine] Nausea Only    nausea nausea     Exam:  BP 138/82 mmHg  Pulse 92  Ht 5' 9"  (1.753 m)  Wt 290 lb (131.543 kg)  BMI 42.81 kg/m2  SpO2 96% Gen: Ill appearing with mild respiratory distress observed.  HEENT: EOMI,  MMM. Frontal and maxillary sinuses tender to palpation. Lymph: No lymphadenopathy palpated. Lungs: Use of accessory muscles observed. Expiratory wheezing auscultated in all lobes. Mild improvement after duoneb administration.  Heart: RRR no MRG Exts: Brisk capillary refill, warm and well perfused.   No results found for this or any previous visit (from the past 24 hour(s)). No results found.   Assessment and Plan:  Asthma exacerbation with bronchitis- Administered duoneb treatment, 1g of Rocephin IM and 80 mg IM depomedrol injection in office today. Patient given portable compressor nebulizer and 350 mL duoneb solution for home use q 4 hours prn. Rx 750 mg levofloxacin x 5 days. Patient instructed to return on Monday for evaluation, and instructed to go to ED if experiencing severe chest pain or sob. Not adding a daily inhaler at this time as this is the patient's first exacerbation this year. Will add if she continues to experience exacerbations.  Reviewed and changes  made by [name] PA-C

## 2015-01-29 ENCOUNTER — Encounter: Payer: Self-pay | Admitting: Physician Assistant

## 2015-01-29 ENCOUNTER — Ambulatory Visit (INDEPENDENT_AMBULATORY_CARE_PROVIDER_SITE_OTHER): Payer: 59

## 2015-01-29 ENCOUNTER — Ambulatory Visit (INDEPENDENT_AMBULATORY_CARE_PROVIDER_SITE_OTHER): Payer: 59 | Admitting: Physician Assistant

## 2015-01-29 ENCOUNTER — Other Ambulatory Visit: Payer: Self-pay | Admitting: Family Medicine

## 2015-01-29 VITALS — BP 137/79 | HR 98 | Temp 98.4°F | Ht 69.0 in | Wt 288.0 lb

## 2015-01-29 DIAGNOSIS — J45901 Unspecified asthma with (acute) exacerbation: Secondary | ICD-10-CM | POA: Insufficient documentation

## 2015-01-29 DIAGNOSIS — J40 Bronchitis, not specified as acute or chronic: Secondary | ICD-10-CM

## 2015-01-29 MED ORDER — MONTELUKAST SODIUM 10 MG PO TABS: 10.0000 mg | ORAL_TABLET | Freq: Every day | ORAL | Status: DC

## 2015-01-29 MED ORDER — PREDNISONE 20 MG PO TABS: ORAL_TABLET | ORAL | Status: DC

## 2015-01-29 NOTE — Progress Notes (Signed)
Kathryn Cline is a 39 y.o. female who presents to [place]: Primary Care  today for asthma exacerbation. She was seen on 9/23 and was given a duoneb treatment, 1g rocephin IM and 80 mg IM depomedrol injection in office. She was given portable compressor nebulize and 350 mL duoneb solution for home use q 4 hours prn, and 750 mg levofloxacin x 5 days. She was seen at [facility] ED on 01/21/2015 for an asthma exacerbation, she was treated with a duoneb treatment and prednisone taper. She was seen on 9/15 for a sinus infection, she was treated with 7 days of doxycycline and intranasal fluticasone. She reports she has taken all medications as directed and is not feeling any better. She states the duoneb improves her symptoms for a short time, but they immediately return. She reports she still has sinus pressure, sob, chills, diaphoresis, fatigue, myaglias, and headache. She denies chest pain, abdominal pain, and N/V/D.     Past Medical History  Diagnosis Date  . Neuropathy   . Fibromyalgia   . GERD (gastroesophageal reflux disease)   . Allergy   . Hypertension    Past Surgical History  Procedure Laterality Date  . Appendectomy    . Abdominal hysterectomy    . Wrist surgery    . Left foot surgery    . Cyst removal trunk    . Cholecystectomy     Social History  Substance Use Topics  . Smoking status: Current Every Day Smoker  . Smokeless tobacco: Never Used  . Alcohol Use: No   family history includes Cancer in her maternal grandfather and paternal grandmother; Heart attack in her brother, maternal grandfather, maternal grandmother, paternal grandfather, and paternal grandmother; Hypertension in her brother, brother, maternal grandfather, maternal grandmother, paternal grandfather, and paternal grandmother; Stroke in her paternal grandmother.  ROS as above Medications: Current Outpatient Prescriptions  Medication Sig Dispense Refill  . AMBULATORY NON  FORMULARY MEDICATION Stress B-complex    . AMBULATORY NON FORMULARY MEDICATION Tens units   Dx: chronic pain, fibromyalgia 1 Device 0  . celecoxib (CELEBREX) 200 MG capsule Take 1 capsule (200 mg total) by mouth 2 (two) times daily. 60 capsule 5  . cetirizine (ZYRTEC) 10 MG tablet Take 10 mg by mouth daily.    . cholecalciferol (VITAMIN D) 1000 UNITS tablet Take 3,000 Units by mouth daily.    . clonazePAM (KLONOPIN) 0.5 MG tablet Take 0.5-1 tablets (0.25-0.5 mg total) by mouth daily as needed for anxiety. 15 tablet 0  . diclofenac sodium (VOLTAREN) 1 % GEL Apply 4 g topically 4 (four) times daily. 2 Tube 3  . fluconazole (DIFLUCAN) 200 MG tablet Take 1 tablet (200 mg total) by mouth once. 2 tablet 0  . fluticasone (FLONASE) 50 MCG/ACT nasal spray One spray in each nostril twice a day, use left hand for right nostril, and right hand for left nostril. 48 g 3  . furosemide (LASIX) 40 MG tablet TAKE ONE TABLET BY MOUTH ONCE DAILY 30 tablet 3  . HYDROcodone-acetaminophen (NORCO/VICODIN) 5-325 MG per tablet Take 1 tablet by mouth every 8 (eight) hours as needed for moderate pain. 10 tablet 0  . ipratropium-albuterol (DUONEB) 0.5-2.5 (3) MG/3ML SOLN Take 3 mLs by nebulization every 4 (four) hours as needed. 360 mL 0  . levofloxacin (LEVAQUIN) 750 MG tablet Take 1 tablet (750 mg total) by mouth daily. 5 tablet 0  . lisinopril (PRINIVIL,ZESTRIL) 20 MG tablet TAKE ONE TABLET BY MOUTH ONCE DAILY, NEEDS A  FOLLOW UP APPOINTMENT BEFORE MORE REFILLS 30 tablet 0  . pantoprazole (PROTONIX) 40 MG tablet Take 1 tablet (40 mg total) by mouth 2 (two) times daily. 60 tablet 5  . promethazine (PHENERGAN) 25 MG tablet Take 1 tablet (25 mg total) by mouth every 6 (six) hours as needed for nausea or vomiting. 30 tablet 2  . rizatriptan (MAXALT) 5 MG tablet Take 5 mg by mouth as needed for migraine. May repeat in 2 hours if needed    . sertraline (ZOLOFT) 50 MG tablet Take 1.5 tablets (75 mg total) by mouth daily. 45 tablet  1  . topiramate (TOPAMAX) 50 MG tablet Take 25 mg by mouth daily.     . traZODone (DESYREL) 50 MG tablet Take 1 tablet (50 mg total) by mouth 60 (sixty) minutes before procedure. 60 tablet 1   No current facility-administered medications for this visit.   Allergies  Allergen Reactions  . Oxycodone-Acetaminophen Itching  . Vicodin [Hydrocodone-Acetaminophen] Nausea And Vomiting and Itching  . Viibryd [Vilazodone Hcl] Shortness Of Breath and Swelling  . Amitriptyline Other (See Comments) and Rash    Pt stated she felt like she was watching herself from outside her body Pt stated she felt like she was watching herself from outside her body  . Brintellix [Vortioxetine] Other (See Comments), Nausea Only and Nausea And Vomiting    vomiting vomiting vomiting vomiting  . Bupropion Hcl Other (See Comments)    headache  . Lyrica [Pregabalin] Swelling  . Azithromycin Other (See Comments)    Slow heart rate bradycardia  . Bupropion Other (See Comments)    migraines  . Gabapentin Other (See Comments)    Spaced out sleepiness  . Chlordiazepoxide-Amitriptyline Nausea Only  . Effexor [Venlafaxine] Nausea Only    nausea nausea     Exam:  BP 137/79 mmHg  Pulse 98  Temp(Src) 98.4 F (36.9 C) (Oral)  Ht 5' 9"  (1.753 m)  Wt 288 lb (130.636 kg)  BMI 42.51 kg/m2  SpO2 96% Gen: Well NAD, appears under distress.  HEENT: EOMI,  MMM Lungs: Mild improvement, moderate wheezing still present bilaterally in all 4 quadrants. Heart: RRR no MRG Exts: Brisk capillary refill, warm and well perfused.   Assessment and Plan:  Asthma exacerbation with bronchitis- Repeat CXR today. Pulse ox is stable at 96 percent. Continue home duoneb treatment q 4 hours. Rx prednisone taper of 60 mg x 3 days, 40 mg x 3 days, 20 mg x 3 days, and 10 mg x 4 days. Rx 10 mg montelukast tablet qd to see if there is a allergic component. Will certainly consider maintenance inhaler at the end of prednisone taper. I certainly  do not feel like there is an infectious component.follow up in 1 week and sooner if not improving.    Reviewed and changes made by [name] PA-C

## 2015-02-15 ENCOUNTER — Ambulatory Visit (INDEPENDENT_AMBULATORY_CARE_PROVIDER_SITE_OTHER): Payer: 59 | Admitting: Medical

## 2015-02-15 ENCOUNTER — Encounter (HOSPITAL_COMMUNITY): Payer: Self-pay | Admitting: Medical

## 2015-02-15 VITALS — BP 124/70 | Ht 69.0 in

## 2015-02-15 DIAGNOSIS — T7422XA Child sexual abuse, confirmed, initial encounter: Secondary | ICD-10-CM | POA: Insufficient documentation

## 2015-02-15 DIAGNOSIS — G894 Chronic pain syndrome: Secondary | ICD-10-CM

## 2015-02-15 DIAGNOSIS — T7422XS Child sexual abuse, confirmed, sequela: Secondary | ICD-10-CM

## 2015-02-15 DIAGNOSIS — F341 Dysthymic disorder: Secondary | ICD-10-CM

## 2015-02-15 DIAGNOSIS — F332 Major depressive disorder, recurrent severe without psychotic features: Secondary | ICD-10-CM

## 2015-02-15 DIAGNOSIS — M797 Fibromyalgia: Secondary | ICD-10-CM

## 2015-02-15 DIAGNOSIS — F4312 Post-traumatic stress disorder, chronic: Secondary | ICD-10-CM

## 2015-02-15 DIAGNOSIS — F419 Anxiety disorder, unspecified: Secondary | ICD-10-CM

## 2015-02-15 MED ORDER — DULOXETINE HCL 60 MG PO CPEP: 60.0000 mg | ORAL_CAPSULE | Freq: Every day | ORAL | Status: DC
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
## 2015-02-16 ENCOUNTER — Ambulatory Visit (INDEPENDENT_AMBULATORY_CARE_PROVIDER_SITE_OTHER): Payer: 59 | Admitting: Sports Medicine

## 2015-02-16 ENCOUNTER — Ambulatory Visit (INDEPENDENT_AMBULATORY_CARE_PROVIDER_SITE_OTHER): Payer: 59 | Admitting: Licensed Clinical Social Worker

## 2015-02-16 VITALS — BP 132/84 | HR 101 | Wt 283.0 lb

## 2015-02-16 DIAGNOSIS — F341 Dysthymic disorder: Secondary | ICD-10-CM | POA: Diagnosis not present

## 2015-02-16 DIAGNOSIS — Z720 Tobacco use: Secondary | ICD-10-CM | POA: Diagnosis not present

## 2015-02-16 DIAGNOSIS — F419 Anxiety disorder, unspecified: Secondary | ICD-10-CM | POA: Diagnosis not present

## 2015-02-16 DIAGNOSIS — M160 Bilateral primary osteoarthritis of hip: Secondary | ICD-10-CM

## 2015-02-16 DIAGNOSIS — F17200 Nicotine dependence, unspecified, uncomplicated: Secondary | ICD-10-CM

## 2015-02-16 MED ORDER — VARENICLINE TARTRATE 0.5 MG X 11 & 1 MG X 42 PO MISC: ORAL | Status: DC

## 2015-02-16 MED ORDER — PHENTERMINE HCL 37.5 MG PO TABS: ORAL_TABLET | ORAL | Status: DC

## 2015-02-16 MED ORDER — TOPIRAMATE 50 MG PO TABS: ORAL_TABLET | ORAL | Status: DC

## 2015-02-16 NOTE — Assessment & Plan Note (Signed)
Bilateral hip joint injection as above. The repeat up to 4 times in one year. Previous injection was 3 months ago.

## 2015-02-16 NOTE — Addendum Note (Signed)
Addended by: [name] on: 02/16/2015 11:25 AM   Modules accepted: Orders

## 2015-02-16 NOTE — Assessment & Plan Note (Signed)
Turning Chantix.

## 2015-02-16 NOTE — Progress Notes (Signed)
  Subjective:    CC: Bilateral hip pain   HPI: Primary osteoarthritis of both hips: Previous injection was 3 months ago, desires repeat bilateral femoral acetabular joint injection today. Pain is moderate, persistent, no radiation.  Obesity: Amenable to try some weight loss medication, she will follow this up with her PCP.  Past medical history, Surgical history, Family history not pertinant except as noted below, Social history, Allergies, and medications have been entered into the medical record, reviewed, and no changes needed.   Review of Systems: No fevers, chills, night sweats, weight loss, chest pain, or shortness of breath.   Objective:    General: Well Developed, well nourished, and in no acute distress.  Neuro: Alert and oriented x3, extra-ocular muscles intact, sensation grossly intact.  HEENT: Normocephalic, atraumatic, pupils equal round reactive to light, neck supple, no masses, no lymphadenopathy, thyroid nonpalpable.  Skin: Warm and dry, no rashes. Cardiac: Regular rate and rhythm, no murmurs rubs or gallops, no lower extremity edema.  Respiratory: Clear to auscultation bilaterally. Not using accessory muscles, speaking in full sentences.  Procedure: Real-time Ultrasound Guided Injection of right femoral acetabular joint Device: GE Logiq E  Verbal informed consent obtained.  Time-out conducted.  Noted no overlying erythema, induration, or other signs of local infection.  Skin prepped in a sterile fashion.  Local anesthesia: Topical Ethyl chloride.  With sterile technique and under real time ultrasound guidance:  2 mL kenalog 40, 2 mL lidocaine, 2 mL Marcaine injected easily. Completed without difficulty  Pain immediately resolved suggesting accurate placement of the medication.  Advised to call if fevers/chills, erythema, induration, drainage, or persistent bleeding.  Images permanently stored and available for review in the ultrasound unit.  Impression: Technically  successful ultrasound guided injection.  Procedure: Real-time Ultrasound Guided Injection of left femoral acetabular joint Device: GE Logiq E  Verbal informed consent obtained.  Time-out conducted.  Noted no overlying erythema, induration, or other signs of local infection.  Skin prepped in a sterile fashion.  Local anesthesia: Topical Ethyl chloride.  With sterile technique and under real time ultrasound guidance:  2 mL kenalog 40, 2 mL lidocaine, 2 mL Marcaine injected easily. Completed without difficulty  Pain immediately resolved suggesting accurate placement of the medication.  Advised to call if fevers/chills, erythema, induration, drainage, or persistent bleeding.  Images permanently stored and available for review in the ultrasound unit.  Impression: Technically successful ultrasound guided injection.  Impression and Recommendations:

## 2015-02-16 NOTE — Assessment & Plan Note (Signed)
Restarting phentermine. Return monthly for weight checks and refills, please see PCP. I will also double her Topamax to one half tab daily for a week then 1 tab daily.

## 2015-02-16 NOTE — Progress Notes (Signed)
THERAPIST PROGRESS NOTE  Session Time: 1:05pm-2:00pm  Participation Level: Active  Behavioral Response:Casual Drowsy Euthymic, but somewhat flat affect    Type of Therapy: Individual Therapy  Treatment Goals addressed: Depression and anxiety  Interventions: Review of treatment progress  Therapist Interventions:  Had patient complete a PHQ-9 and GAD-7 to assess for severity of depression and anxiety. Determined that patient has achieved her treatment goals to reduce symptoms of depression and anxiety.   Asked if she had further concerns she would like to address in therapy.  Collaboratively decided to end therapy at this time.         Summary:  Identified several things she is looking forward to including a family vacation and moving into her grandmother's house.   Indicated she is coping well with challenges of her chronic pain.  PHQ-9 score was 9 (mild) GAD-7= 8 (moderate)  Noted that having trouble relaxing has more to do with her chronic pain than excessive worrying.           Suicidal/Homicidal: Denied both    Plan:No further therapy sessions scheduled.  Patient understands she can call to schedule if MH concerns develop.  Diagnosis:Persistent Depressive Disorder with anxious distress      Armandina Stammer 02/16/15

## 2015-02-22 ENCOUNTER — Telehealth: Payer: Self-pay | Admitting: Physician Assistant

## 2015-02-22 NOTE — Telephone Encounter (Signed)
Received fax for prior authorization on Chantix starting pak sent through cover my meds waiting on authorization. - CF

## 2015-02-27 NOTE — Telephone Encounter (Signed)
Received fax from [employer] they approved coverage on Chantix Starting month until 02/22/2016. File ID [id number]. Pharmacy notified - CF

## 2015-02-28 ENCOUNTER — Telehealth: Payer: Self-pay | Admitting: Physician Assistant

## 2015-02-28 NOTE — Telephone Encounter (Signed)
Received a call from [facility] at [facility] stating Ms. Nicklin had [employer] and needed authorization for the referral to their office. Her appointment was on 02/15/2015 so I called [employer] since it was over 5 days and received Referral # [id number] good from 02/15/2015 - 08/16/2015. I faxed it over to [facility] at [phone]. - CF

## 2015-03-07 ENCOUNTER — Other Ambulatory Visit: Payer: Self-pay | Admitting: Physician Assistant

## 2015-03-14 ENCOUNTER — Encounter: Payer: Self-pay | Admitting: Physician Assistant

## 2015-03-14 ENCOUNTER — Ambulatory Visit (INDEPENDENT_AMBULATORY_CARE_PROVIDER_SITE_OTHER): Payer: 59 | Admitting: Physician Assistant

## 2015-03-14 VITALS — BP 117/69 | HR 89 | Temp 98.4°F | Ht 69.0 in | Wt 279.0 lb

## 2015-03-14 DIAGNOSIS — J209 Acute bronchitis, unspecified: Secondary | ICD-10-CM | POA: Diagnosis not present

## 2015-03-14 DIAGNOSIS — J0100 Acute maxillary sinusitis, unspecified: Secondary | ICD-10-CM

## 2015-03-14 DIAGNOSIS — Z720 Tobacco use: Secondary | ICD-10-CM

## 2015-03-14 DIAGNOSIS — F17200 Nicotine dependence, unspecified, uncomplicated: Secondary | ICD-10-CM

## 2015-03-14 MED ORDER — ALBUTEROL SULFATE HFA 108 (90 BASE) MCG/ACT IN AERS: 1.0000 | INHALATION_SPRAY | Freq: Four times a day (QID) | RESPIRATORY_TRACT | Status: DC | PRN

## 2015-03-14 MED ORDER — LEVOFLOXACIN 750 MG PO TABS: 750.0000 mg | ORAL_TABLET | Freq: Every day | ORAL | Status: DC

## 2015-03-14 MED ORDER — PREDNISONE 20 MG PO TABS: ORAL_TABLET | ORAL | Status: DC

## 2015-03-14 MED ORDER — PHENTERMINE HCL 37.5 MG PO TABS: ORAL_TABLET | ORAL | Status: DC

## 2015-03-14 MED ORDER — HYDROCODONE-HOMATROPINE 5-1.5 MG/5ML PO SYRP: 5.0000 mL | ORAL_SOLUTION | Freq: Every evening | ORAL | Status: DC | PRN

## 2015-03-14 NOTE — Progress Notes (Signed)
   Subjective:    Patient ID: Kathryn Cline, female    DOB: 12/21/1975, 39 y.o.   MRN: [id number]  HPI Patient is a 39 year old female who presents to the clinic with her daughter. 2 weeks ago she started with cold-like symptoms. Now they have progressed into chest tightness, wheezing, cough, intense sinus pressure, and ear pain. She denies any fever. She does feel like she is slightly achy. She has a sore throat. Her cough has started to become productive with green sputum. She has tried some Mucinex D over-the-counter with no significant improvement. She does have a history of these symptoms developing into pneumonia. Her last case of pneumonia was 01/2015.  Patient is not currently taking her diet pill phentermine however she has been before she got sick. She has lost 9 pounds BuSpar. She denies any side effects of the medication.   Review of Systems  All other systems reviewed and are negative.      Objective:   Physical Exam  Constitutional: She is oriented to person, place, and time. She appears well-developed and well-nourished.  Morbid obesity  HENT:  Head: Normocephalic and atraumatic.  Right Ear: External ear normal.  Left Ear: External ear normal.  TM's erythematous right only. No blood or pus.   Bilateral maxillary sinus tenderness to palpation.   Oropharynx erythematous without tonsillar enlargement or swelling or exudate.  Bilateral nasal turbinates red and swollen.  Eyes:  Bilateral conjunctiva injected with watery discharge.   Neck: Normal range of motion. Neck supple.  Cardiovascular: Normal rate, regular rhythm and normal heart sounds.   Pulmonary/Chest: Effort normal.  Productive wet cough on exam today.  Bilateral expiratory wheezing.   Lymphadenopathy:    She has no cervical adenopathy.  Neurological: She is alert and oriented to person, place, and time.  Psychiatric: She has a normal mood and affect. Her behavior is normal.          Assessment &  Plan:  Acute maxillary sinusitis/acute bronchitis-patient has nebulizer at home. Encouraged her to use DuoNeb every 2-4 hours. I did give her a portable Pro Air inhaler to use it out and about. Started on Levaquin for 5 days to treat sinusitis, bronchitis and potential pneumonia. Prednisone was also sent to the pharmacy. Patient discussed that she can take Hycodan at bedtime without any reactions. I did give her small quantity. Discussed with her not to take during the day but encouraged her to cough up the sputum. She may remain on Mucinex as well. Follow-up if symptoms are worsening or not improving.  Obesity morbid- refilled phentermine. Patient will have a nurse visit in one month to make sure she is losing weight and her vitals are stable. I recommended that she not take phentermine while recovering from today's illnesses. Patient agreed with plan.

## 2015-03-19 ENCOUNTER — Telehealth: Payer: Self-pay

## 2015-03-19 MED ORDER — FLUCONAZOLE 150 MG PO TABS: 150.0000 mg | ORAL_TABLET | Freq: Once | ORAL | Status: DC

## 2015-03-19 NOTE — Telephone Encounter (Signed)
Patient called and left a message stating she has a yeast infection due to antibiotic use. She would like diflucan sent in. Please advise.

## 2015-03-19 NOTE — Telephone Encounter (Signed)
Patient aware.

## 2015-03-19 NOTE — Telephone Encounter (Signed)
Ok for diflucan 161m once #1 no refills.

## 2015-03-20 ENCOUNTER — Ambulatory Visit: Payer: Self-pay | Admitting: Physician Assistant

## 2015-03-22 ENCOUNTER — Ambulatory Visit (HOSPITAL_COMMUNITY): Payer: 59 | Admitting: Medical

## 2015-03-28 ENCOUNTER — Ambulatory Visit (INDEPENDENT_AMBULATORY_CARE_PROVIDER_SITE_OTHER): Payer: 59 | Admitting: Family Medicine

## 2015-03-28 ENCOUNTER — Ambulatory Visit: Payer: Self-pay | Admitting: Sports Medicine

## 2015-03-28 ENCOUNTER — Encounter: Payer: Self-pay | Admitting: Family Medicine

## 2015-03-28 ENCOUNTER — Ambulatory Visit (INDEPENDENT_AMBULATORY_CARE_PROVIDER_SITE_OTHER): Payer: 59

## 2015-03-28 VITALS — BP 121/75 | HR 85 | Wt 278.0 lb

## 2015-03-28 DIAGNOSIS — W19XXXA Unspecified fall, initial encounter: Secondary | ICD-10-CM | POA: Diagnosis not present

## 2015-03-28 DIAGNOSIS — M5442 Lumbago with sciatica, left side: Secondary | ICD-10-CM

## 2015-03-28 DIAGNOSIS — M5136 Other intervertebral disc degeneration, lumbar region: Secondary | ICD-10-CM | POA: Diagnosis not present

## 2015-03-28 DIAGNOSIS — M25552 Pain in left hip: Secondary | ICD-10-CM | POA: Diagnosis not present

## 2015-03-28 NOTE — Progress Notes (Signed)
Kathryn Cline is a 39 y.o. female who presents to [facility]  today for all and left hip and back pain. Patient is currently being evaluated for chronic back and hip pain. She receives epidural steroid injections, and chronic pain management from a pain management clinic. Additionally she receives intermittent similar acetabular injections by Dr. Dianah Field. She was doing well in her normal state of health until a few weeks ago when she felt as though her legs gave out from her and she fell and hit the left lateral hip on the ground. Since then she's had some left lateral hip pain. However she notes continued intermittent weakness and numbness in her lower extremity's. She notes been a while since her last back MRI, and her last nerve conduction study. She notes that she has a diagnosis of peripheral neuropathy.   Past Medical History  Diagnosis Date  . Neuropathy ([place])   . Fibromyalgia   . GERD (gastroesophageal reflux disease)   . Allergy   . Hypertension    Past Surgical History  Procedure Laterality Date  . Appendectomy    . Abdominal hysterectomy    . Wrist surgery    . Left foot surgery    . Cyst removal trunk    . Cholecystectomy     Social History  Substance Use Topics  . Smoking status: Current Every Day Smoker  . Smokeless tobacco: Never Used  . Alcohol Use: No   family history includes Cancer in her maternal grandfather and paternal grandmother; Heart attack in her brother, maternal grandfather, maternal grandmother, paternal grandfather, and paternal grandmother; Hypertension in her brother, brother, maternal grandfather, maternal grandmother, paternal grandfather, and paternal grandmother; Stroke in her paternal grandmother.  ROS as above Medications: Current Outpatient Prescriptions  Medication Sig Dispense Refill  . albuterol (PROAIR HFA) 108 (90 BASE) MCG/ACT inhaler Inhale 1-2 puffs into the lungs every 6 (six) hours as needed  for wheezing or shortness of breath. 1 Inhaler 1  . AMBULATORY NON FORMULARY MEDICATION Stress B-complex    . AMBULATORY NON FORMULARY MEDICATION Tens units   Dx: chronic pain, fibromyalgia 1 Device 0  . celecoxib (CELEBREX) 200 MG capsule Take 1 capsule (200 mg total) by mouth 2 (two) times daily. 60 capsule 5  . cetirizine (ZYRTEC) 10 MG tablet Take 10 mg by mouth daily.    . cholecalciferol (VITAMIN D) 1000 UNITS tablet Take 3,000 Units by mouth daily.    . clonazePAM (KLONOPIN) 0.5 MG tablet Take 0.5-1 tablets (0.25-0.5 mg total) by mouth daily as needed for anxiety. 15 tablet 0  . diclofenac sodium (VOLTAREN) 1 % GEL Apply 4 g topically 4 (four) times daily. 2 Tube 3  . DULoxetine (CYMBALTA) 60 MG capsule Take 1 capsule (60 mg total) by mouth daily. 30 capsule 2  . fluconazole (DIFLUCAN) 150 MG tablet Take 1 tablet (150 mg total) by mouth once. 1 tablet 0  . fluticasone (FLONASE) 50 MCG/ACT nasal spray One spray in each nostril twice a day, use left hand for right nostril, and right hand for left nostril. 48 g 3  . furosemide (LASIX) 40 MG tablet TAKE ONE TABLET BY MOUTH ONCE DAILY 30 tablet 3  . HYDROcodone-acetaminophen (NORCO/VICODIN) 5-325 MG per tablet Take 1 tablet by mouth every 8 (eight) hours as needed for moderate pain. 10 tablet 0  . HYDROcodone-homatropine (HYCODAN) 5-1.5 MG/5ML syrup Take 5 mLs by mouth at bedtime as needed. 120 mL 0  . ipratropium-albuterol (DUONEB) 0.5-2.5 (3) MG/3ML  SOLN Take 3 mLs by nebulization every 4 (four) hours as needed. 360 mL 0  . levofloxacin (LEVAQUIN) 750 MG tablet Take 1 tablet (750 mg total) by mouth daily. 5 tablet 0  . lisinopril (PRINIVIL,ZESTRIL) 20 MG tablet TAKE ONE TABLET BY MOUTH ONCE DAILY....Marland KitchenPATIENT NEEDS TO SCHEDULE A FOLLOW UP APPOINTMENT BEFORE MORE REFILLS 30 tablet 0  . montelukast (SINGULAIR) 10 MG tablet Take 1 tablet (10 mg total) by mouth at bedtime. 30 tablet 2  . pantoprazole (PROTONIX) 40 MG tablet Take 1 tablet (40 mg  total) by mouth 2 (two) times daily. 60 tablet 5  . phentermine (ADIPEX-P) 37.5 MG tablet One tab by mouth qAM 30 tablet 0  . predniSONE (DELTASONE) 20 MG tablet Take 3 tablets for 3 days, take 2 tablets for 3 days, take 1 tablet for 3 days, take 1/2 tablet for 4 days. 21 tablet 0  . promethazine (PHENERGAN) 25 MG tablet Take 1 tablet (25 mg total) by mouth every 6 (six) hours as needed for nausea or vomiting. 30 tablet 2  . rizatriptan (MAXALT) 5 MG tablet Take 5 mg by mouth as needed for migraine. May repeat in 2 hours if needed    . topiramate (TOPAMAX) 50 MG tablet One half tab by mouth daily for a week, then one tab by mouth daily. 30 tablet 0  . traZODone (DESYREL) 50 MG tablet Take 1 tablet (50 mg total) by mouth 60 (sixty) minutes before procedure. 60 tablet 1  . varenicline (CHANTIX STARTING MONTH PAK) 0.5 MG X 11 & 1 MG X 42 tablet Take one 0.69m tablet by mouth once daily for 3 days, then increase to one 0.576mtablet twice daily for 3 days, then increase to one 4m23mablet twice daily. 53 tablet 0   No current facility-administered medications for this visit.   Allergies  Allergen Reactions  . Oxycodone-Acetaminophen Itching  . Vicodin [Hydrocodone-Acetaminophen] Nausea And Vomiting and Itching  . Viibryd [Vilazodone Hcl] Shortness Of Breath and Swelling  . Amitriptyline Other (See Comments) and Rash    Pt stated she felt like she was watching herself from outside her body Pt stated she felt like she was watching herself from outside her body  . Brintellix [Vortioxetine] Other (See Comments), Nausea Only and Nausea And Vomiting    vomiting vomiting vomiting vomiting  . Bupropion Other (See Comments)    Other reaction(s): Other (See Comments) headache migraines  . Bupropion Hcl Other (See Comments)    headache  . Lyrica [Pregabalin] Swelling  . Azithromycin Other (See Comments)    Slow heart rate bradycardia  . Gabapentin Other (See Comments)    Spaced out sleepiness  .  Chlordiazepoxide-Amitriptyline Nausea Only  . Effexor [Venlafaxine] Nausea Only    nausea nausea nausea nausea     Exam:  BP 121/75 mmHg  Pulse 85  Wt 278 lb (126.1 kg) Gen: Well NAD HEENT: EOMI,  MMM Lungs: Normal work of breathing. CTABL Heart: RRR no MRG Abd: NABS, Soft. Nondistended, Nontender Exts: Brisk capillary refill, warm and well perfused.  Back: Nontender to spinal midline. Tender palpation left SI joint and left lateral hip. Normal hip motion. Abduction strength is intact. Patient can rise from a seated position and walks with a mildly antalgic gait. She can stand on her toes and heels.  X-ray lumbar spine and pelvis without significant acute fracture visible. Awaiting formal radiology read.  No results found for this or any previous visit (from the past 24 hour(s)). No results found.  Please see individual assessment and plan sections.

## 2015-03-28 NOTE — Progress Notes (Signed)
Quick Note:  Pelvis xray is ok ______

## 2015-03-28 NOTE — Patient Instructions (Signed)
Thank you for coming in today. Return with Dr T in the near future.  Come back or go to the emergency room if you notice new weakness new numbness problems walking or bowel or bladder problems.  We will call with results.

## 2015-03-28 NOTE — Progress Notes (Signed)
Quick Note:  Spine shows arthritis. No fractures ______

## 2015-03-28 NOTE — Assessment & Plan Note (Signed)
Unclear etiology. I suspect patient's symptoms are due to neuropathy and less likely due to nerve impingement. She has intact leg strength at today's exam. No obvious injury on x-ray. Awaiting formal radiology read. Follow-up with PCP.

## 2015-04-05 ENCOUNTER — Ambulatory Visit (INDEPENDENT_AMBULATORY_CARE_PROVIDER_SITE_OTHER): Payer: 59 | Admitting: Sports Medicine

## 2015-04-05 ENCOUNTER — Encounter: Payer: Self-pay | Admitting: Sports Medicine

## 2015-04-05 VITALS — BP 142/87 | HR 91 | Wt 276.0 lb

## 2015-04-05 DIAGNOSIS — G629 Polyneuropathy, unspecified: Secondary | ICD-10-CM

## 2015-04-05 DIAGNOSIS — M160 Bilateral primary osteoarthritis of hip: Secondary | ICD-10-CM

## 2015-04-05 NOTE — Assessment & Plan Note (Addendum)
Falls are likely related to some degree of peripheral neuropathy versus her radiculopathy. Continue pain management. I would like her to discuss with her neurologist again doing a nerve conduction and EMG. She also needs some balance and gait rehabilitation. I will place a referral. I have gone over her medication list and we are discontinuing all medications that can be an iatrogenic cause of her falls including Flexeril and alprazolam.

## 2015-04-05 NOTE — Progress Notes (Signed)
  Subjective:    CC: Follow-up  HPI: Bilateral hip osteoarthritis: Moderate, persistent. Previous injection was 2 months ago she was doing well until a recent fall. Having recurrence of pain that is improving.  Neuropathy: Bilateral lower study numbness and feeling, she does have a history of widespread lumbar degenerative disc disease with radiculopathy currently being treated by pain management provider, she recently had an epidural, she is getting occasional numbness and tingling down the legs, with occasional falls. She is taking alprazolam and Flexeril currently. Principal concern is numbness and tingling in the lower extremities.  Past medical history, Surgical history, Family history not pertinant except as noted below, Social history, Allergies, and medications have been entered into the medical record, reviewed, and no changes needed.   Review of Systems: No fevers, chills, night sweats, weight loss, chest pain, or shortness of breath.   Objective:    General: Well Developed, well nourished, and in no acute distress.  Neuro: Alert and oriented x3, extra-ocular muscles intact, sensation grossly intact.  HEENT: Normocephalic, atraumatic, pupils equal round reactive to light, neck supple, no masses, no lymphadenopathy, thyroid nonpalpable.  Skin: Warm and dry, no rashes. Cardiac: Regular rate and rhythm, no murmurs rubs or gallops, no lower extremity edema.  Respiratory: Clear to auscultation bilaterally. Not using accessory muscles, speaking in full sentences.  Impression and Recommendations:   I spent 25 minutes with this patient, greater than 50% was face-to-face time counseling regarding the above diagnoses

## 2015-04-05 NOTE — Assessment & Plan Note (Signed)
Doing ok after injection, some increase in pain after fall. Revisit as a future visit.

## 2015-04-10 ENCOUNTER — Ambulatory Visit (INDEPENDENT_AMBULATORY_CARE_PROVIDER_SITE_OTHER): Payer: 59 | Admitting: Rehabilitative and Restorative Service Providers"

## 2015-04-10 ENCOUNTER — Encounter: Payer: Self-pay | Admitting: Rehabilitative and Restorative Service Providers"

## 2015-04-10 DIAGNOSIS — R269 Unspecified abnormalities of gait and mobility: Secondary | ICD-10-CM

## 2015-04-10 DIAGNOSIS — R29898 Other symptoms and signs involving the musculoskeletal system: Secondary | ICD-10-CM | POA: Diagnosis not present

## 2015-04-10 DIAGNOSIS — R2681 Unsteadiness on feet: Secondary | ICD-10-CM
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
## 2015-04-10 NOTE — Patient Instructions (Signed)
With all balance activities - stay safe - standing with   Standing still working on posture and alignment - standing straight and tall without holding on to objects Try to stand for 2 min at a time 2-3 reps resting as needed between times Try standing with eyes closed for several seconds   Standing with feet even at shoulder width apart.  Bend knees slightly hold 5-10 sec  Repeat 10-20 times   Stand with feet even  Touch foot to book or other object 4-6 inches from floor Alternate feet - touching one foot up and then the other 10-20 on each foot

## 2015-04-15 ENCOUNTER — Other Ambulatory Visit: Payer: Self-pay | Admitting: Sports Medicine

## 2015-04-17 ENCOUNTER — Encounter: Payer: Self-pay | Admitting: Physician Assistant

## 2015-04-17 ENCOUNTER — Ambulatory Visit (INDEPENDENT_AMBULATORY_CARE_PROVIDER_SITE_OTHER): Payer: 59 | Admitting: Physician Assistant

## 2015-04-17 VITALS — BP 125/79 | HR 88 | Temp 98.4°F | Ht 69.0 in | Wt 277.0 lb

## 2015-04-17 DIAGNOSIS — J208 Acute bronchitis due to other specified organisms: Secondary | ICD-10-CM | POA: Diagnosis not present

## 2015-04-17 DIAGNOSIS — M5442 Lumbago with sciatica, left side: Secondary | ICD-10-CM | POA: Diagnosis not present

## 2015-04-17 DIAGNOSIS — M160 Bilateral primary osteoarthritis of hip: Secondary | ICD-10-CM

## 2015-04-17 DIAGNOSIS — W19XXXD Unspecified fall, subsequent encounter: Secondary | ICD-10-CM | POA: Diagnosis not present

## 2015-04-17 DIAGNOSIS — G629 Polyneuropathy, unspecified: Secondary | ICD-10-CM

## 2015-04-17 DIAGNOSIS — M797 Fibromyalgia: Secondary | ICD-10-CM | POA: Diagnosis not present

## 2015-04-17 MED ORDER — DOXYCYCLINE HYCLATE 100 MG PO TABS: 100.0000 mg | ORAL_TABLET | Freq: Two times a day (BID) | ORAL | Status: DC

## 2015-04-17 NOTE — Progress Notes (Addendum)
   Subjective:    Patient ID: Kathryn Cline, female    DOB: [birth year], 39 y.o.   MRN: [id number]  HPI  Pt presents to the clinic with cough, headache, sinus pressure, ear pain for last 6 days. She stopped smoking 7 weeks ago with chantix. She has had some wheezing but duoneb at home has helped. She has a lot of sick contacts. 2 people in her family was recently dx with pneumonia. She does have some ulcers in mouth. Cough is productive. No fever, chills, body aches.    Pt feels like neuropathy that she see's Dr. Darene Lamer for is worsening. She has fallen 4 times since November 4th due her legs going out. Would like an order for rollator.       Review of Systems  All other systems reviewed and are negative.      Objective:   Physical Exam  Constitutional: She is oriented to person, place, and time. She appears well-developed and well-nourished.  HENT:  Head: Normocephalic and atraumatic.  Right Ear: External ear normal.  Left Ear: External ear normal.  TM's erythematous with some dullness but light reflex present.   Bilateral maxillary and sinus tenderness to palpation.   Bilatearl nasal turbinates red and swollen.   Eyes: Conjunctivae are normal. Right eye exhibits no discharge. Left eye exhibits no discharge.  Neck: Normal range of motion. Neck supple.  Cardiovascular: Normal rate, regular rhythm and normal heart sounds.   Pulmonary/Chest: Effort normal and breath sounds normal.  Lymphadenopathy:    She has no cervical adenopathy.  Neurological: She is alert and oriented to person, place, and time.  Psychiatric: She has a normal mood and affect. Her behavior is normal.          Assessment & Plan:  Acute bronchitis- treated with doxycycline for 10 days. Discussed delsym for cough. Certainly can consider mucinex daily to help loosen sputum. Continue to use duo neb as needed.    Falls/fibromyaglia/OA of both hips/neuropathy- will send order for rollator to help ambulation.

## 2015-04-20 MED ORDER — AMBULATORY NON FORMULARY MEDICATION: Status: DC

## 2015-04-22 ENCOUNTER — Other Ambulatory Visit: Payer: Self-pay | Admitting: Physician Assistant

## 2015-04-25 ENCOUNTER — Encounter: Payer: Self-pay | Admitting: Rehabilitative and Restorative Service Providers"

## 2015-04-25 ENCOUNTER — Ambulatory Visit (INDEPENDENT_AMBULATORY_CARE_PROVIDER_SITE_OTHER): Payer: 59 | Admitting: Rehabilitative and Restorative Service Providers"

## 2015-04-25 DIAGNOSIS — R269 Unspecified abnormalities of gait and mobility: Secondary | ICD-10-CM

## 2015-04-25 DIAGNOSIS — R29898 Other symptoms and signs involving the musculoskeletal system: Secondary | ICD-10-CM | POA: Diagnosis not present

## 2015-04-25 DIAGNOSIS — M545 Low back pain: Secondary | ICD-10-CM

## 2015-04-25 DIAGNOSIS — R2681 Unsteadiness on feet: Secondary | ICD-10-CM

## 2015-04-25 NOTE — Patient Instructions (Addendum)
Abdominal Bracing With Pelvic Floor (Hook-Lying)    With neutral spine, tighten pelvic floor and abdominals sucking belly button to back bone, tighten muscles in back at waist. Hold 10 sec Repeat _10__ times. Do __several _ times a day. Progress to do this in sitting; standing; walking and with functional activities  Bridging    Slowly raise buttocks from floor, keeping core tight. Hold 5-10 sec Repeat _10___ times per set. Do _1-2___ sets per session. Do _2___ sessions per day.   Strengthening: Hip Adduction - Isometric    With ball or folded pillow between knees, squeeze knees together. Hold __10__ seconds. Repeat __10__ times per set. Do __1-2__ sets per session. Do _2___ sessions per day.  Strengthening: Hip Abduction - Isometric    Using theraband around thighs and push out with knees. Hold __10__ seconds. Repeat __10__ times per set. Do _1-2___ sets per session. Do __2__ sessions per day.   HIP: Hamstrings - Supine    Place strap around foot. Raise leg up, keep knee straight. Hold __30_ seconds. __2_ reps per set, _2-3__  per day   Outer Hip Stretch: Reclined IT Band Stretch (Strap)    Strap around opposite foot, pull across only as far as possible with shoulders on mat. Hold for _30 sec. Repeat __2__ times each leg.  Copyright  VHI. All rights reserved.     Functional Quadriceps: Sit to Stand    Sit on edge of chair, feet flat on floor. Stand upright, extending knees fully. Repeat _5-10___ times per set. Do __1-2__ sets per session. Do __2-3__ sessions per day.
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
## 2015-04-26 ENCOUNTER — Ambulatory Visit (INDEPENDENT_AMBULATORY_CARE_PROVIDER_SITE_OTHER): Payer: 59 | Admitting: Medical

## 2015-04-26 ENCOUNTER — Encounter (HOSPITAL_COMMUNITY): Payer: Self-pay | Admitting: Medical

## 2015-04-26 VITALS — BP 130/70 | HR 90 | Ht 69.0 in | Wt 273.0 lb

## 2015-04-26 DIAGNOSIS — M5136 Other intervertebral disc degeneration, lumbar region: Secondary | ICD-10-CM

## 2015-04-26 DIAGNOSIS — M1610 Unilateral primary osteoarthritis, unspecified hip: Secondary | ICD-10-CM

## 2015-04-26 DIAGNOSIS — M797 Fibromyalgia: Secondary | ICD-10-CM

## 2015-04-26 DIAGNOSIS — T7422XS Child sexual abuse, confirmed, sequela: Secondary | ICD-10-CM

## 2015-04-26 DIAGNOSIS — F4312 Post-traumatic stress disorder, chronic: Secondary | ICD-10-CM

## 2015-04-26 DIAGNOSIS — G894 Chronic pain syndrome: Secondary | ICD-10-CM

## 2015-04-26 MED ORDER — DULOXETINE HCL 60 MG PO CPEP: 60.0000 mg | ORAL_CAPSULE | Freq: Every day | ORAL | Status: DC
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
## 2015-05-02 ENCOUNTER — Other Ambulatory Visit: Payer: Self-pay | Admitting: Physician Assistant

## 2015-05-03 ENCOUNTER — Encounter: Payer: Self-pay | Admitting: Sports Medicine

## 2015-05-03 ENCOUNTER — Telehealth: Payer: Self-pay | Admitting: Physician Assistant

## 2015-05-03 ENCOUNTER — Ambulatory Visit (INDEPENDENT_AMBULATORY_CARE_PROVIDER_SITE_OTHER): Payer: 59 | Admitting: Sports Medicine

## 2015-05-03 VITALS — BP 126/86 | HR 88 | Temp 98.3°F | Resp 18 | Wt 278.9 lb

## 2015-05-03 DIAGNOSIS — J310 Chronic rhinitis: Secondary | ICD-10-CM | POA: Diagnosis not present

## 2015-05-03 MED ORDER — FLUTICASONE PROPIONATE 50 MCG/ACT NA SUSP: NASAL | Status: DC

## 2015-05-03 MED ORDER — RIZATRIPTAN BENZOATE 5 MG PO TABS: 5.0000 mg | ORAL_TABLET | ORAL | Status: DC | PRN

## 2015-05-03 MED ORDER — AZELASTINE HCL 0.15 % NA SOLN: 1.0000 | Freq: Two times a day (BID) | NASAL | Status: DC

## 2015-05-03 NOTE — Assessment & Plan Note (Signed)
With very erythematous and boggy nasal mucosa, adding Azelastine as well as Flonase.

## 2015-05-03 NOTE — Addendum Note (Signed)
Addended by: [name] on: 05/03/2015 11:32 AM   Modules accepted: Orders

## 2015-05-03 NOTE — Telephone Encounter (Signed)
Received fax for prior authorization on Azelastine HCL sent through cover my meds waiting on authorization. - CF

## 2015-05-03 NOTE — Progress Notes (Signed)
  Subjective:    CC: nasal pressure  HPI: For months this pleasant 39 year old female has had persistent nasal pressure, stuffiness, with occasional discharge. She's been treated for sinus infections a couple of times without much improvement, principle concern is a pressure sensation over her nose bilaterally, stuffiness of both ears. No visual changes, no hearing changes, no fevers, chills, no cough, no shortness of breath. Just finishing doxycycline.  Past medical history, Surgical history, Family history not pertinant except as noted below, Social history, Allergies, and medications have been entered into the medical record, reviewed, and no changes needed.   Review of Systems: No fevers, chills, night sweats, weight loss, chest pain, or shortness of breath.   Objective:    General: Well Developed, well nourished, and in no acute distress.  Neuro: Alert and oriented x3, extra-ocular muscles intact, sensation grossly intact.  HEENT: Normocephalic, atraumatic, pupils equal round reactive to light, neck supple, no masses, no lymphadenopathy, thyroid nonpalpable. Oropharynx and ear canals are unremarkable with the exception of minimal sclerosis of the tympanic membranes, there are very boggy and erythematous nasal mucosa with a right nasal septal deviation. Skin: Warm and dry, no rashes. Cardiac: Regular rate and rhythm, no murmurs rubs or gallops, no lower extremity edema.  Respiratory: Clear to auscultation bilaterally. Not using accessory muscles, speaking in full sentences.  Impression and Recommendations:

## 2015-05-04 ENCOUNTER — Other Ambulatory Visit: Payer: Self-pay | Admitting: Physician Assistant

## 2015-05-04 MED ORDER — OLOPATADINE HCL 0.6 % NA SOLN: NASAL | Status: DC

## 2015-05-04 NOTE — Progress Notes (Signed)
Insurance would not pay for astelin sent patanase to try.

## 2015-05-04 NOTE — Telephone Encounter (Signed)
Received fax from [employer] and they denied coverage on Azelastine due to patient not having failure to azelastine nasal spray. - CF

## 2015-05-06 MED ORDER — TRAZODONE HCL 50 MG PO TABS: 50.0000 mg | ORAL_TABLET | Freq: Every evening | ORAL | Status: DC | PRN

## 2015-05-08 ENCOUNTER — Other Ambulatory Visit: Payer: Self-pay | Admitting: Sports Medicine

## 2015-05-08 MED ORDER — AZELASTINE HCL 0.1 % NA SOLN: 2.0000 | Freq: Two times a day (BID) | NASAL | Status: DC

## 2015-05-18 ENCOUNTER — Telehealth: Payer: Self-pay | Admitting: Emergency Medicine

## 2015-05-21 ENCOUNTER — Other Ambulatory Visit: Payer: Self-pay

## 2015-05-21 DIAGNOSIS — R200 Anesthesia of skin: Secondary | ICD-10-CM

## 2015-05-23 LAB — VITAMIN B12: Vitamin B-12: 513 pg/mL (ref 211–911)

## 2015-06-01 ENCOUNTER — Telehealth: Payer: Self-pay

## 2015-06-01 MED ORDER — CELECOXIB 200 MG PO CAPS: 200.0000 mg | ORAL_CAPSULE | Freq: Two times a day (BID) | ORAL | Status: DC

## 2015-06-01 NOTE — Telephone Encounter (Signed)
Ok to refill with 3 refills.

## 2015-06-01 NOTE — Telephone Encounter (Signed)
Patient wants a refill on celebrex. Historical provider.

## 2015-06-01 NOTE — Telephone Encounter (Signed)
Medication sent.

## 2015-06-20 ENCOUNTER — Telehealth: Payer: Self-pay | Admitting: Physician Assistant

## 2015-06-20 ENCOUNTER — Other Ambulatory Visit: Payer: Self-pay | Admitting: *Deleted

## 2015-06-20 MED ORDER — FUROSEMIDE 40 MG PO TABS: 40.0000 mg | ORAL_TABLET | Freq: Every day | ORAL | Status: DC

## 2015-06-20 MED ORDER — LISINOPRIL 20 MG PO TABS: 20.0000 mg | ORAL_TABLET | Freq: Every day | ORAL | Status: DC

## 2015-06-20 MED ORDER — MONTELUKAST SODIUM 10 MG PO TABS: 10.0000 mg | ORAL_TABLET | Freq: Every day | ORAL | Status: DC

## 2015-06-20 NOTE — Telephone Encounter (Signed)
Patient called request to have her pharmacy changed to [facility] on [address] cheaper for insurance purposes. Thanks

## 2015-06-20 NOTE — Telephone Encounter (Signed)
All meds sent to [facility].

## 2015-06-20 NOTE — Telephone Encounter (Signed)
Ok to send with 6 refills.

## 2015-06-20 NOTE — Telephone Encounter (Signed)
Pharmacy changed.Kathryn Cline

## 2015-06-20 NOTE — Telephone Encounter (Signed)
Patient had to switch to [facility] [address] due to insurance....she needs refills on 3 meds.  Lasik, Linsinoprol (?) and Singulair.  Can you please send these to [facility] for her - thanks!

## 2015-06-22 ENCOUNTER — Encounter: Payer: Self-pay | Admitting: Physician Assistant

## 2015-06-22 ENCOUNTER — Ambulatory Visit (INDEPENDENT_AMBULATORY_CARE_PROVIDER_SITE_OTHER): Payer: BLUE CROSS/BLUE SHIELD | Admitting: Physician Assistant

## 2015-06-22 DIAGNOSIS — F411 Generalized anxiety disorder: Secondary | ICD-10-CM

## 2015-06-22 DIAGNOSIS — F329 Major depressive disorder, single episode, unspecified: Secondary | ICD-10-CM | POA: Diagnosis not present

## 2015-06-22 DIAGNOSIS — F419 Anxiety disorder, unspecified: Secondary | ICD-10-CM | POA: Insufficient documentation

## 2015-06-22 DIAGNOSIS — F32A Depression, unspecified: Secondary | ICD-10-CM

## 2015-06-22 MED ORDER — LORAZEPAM 0.5 MG PO TABS: 0.5000 mg | ORAL_TABLET | Freq: Three times a day (TID) | ORAL | Status: DC | PRN

## 2015-06-22 MED ORDER — TOPIRAMATE 50 MG PO TABS: ORAL_TABLET | ORAL | Status: DC

## 2015-06-22 MED ORDER — LIRAGLUTIDE -WEIGHT MANAGEMENT 18 MG/3ML ~~LOC~~ SOPN: 0.6000 mg | PEN_INJECTOR | Freq: Every day | SUBCUTANEOUS | Status: DC

## 2015-06-22 MED ORDER — PHENTERMINE HCL 37.5 MG PO TABS: ORAL_TABLET | ORAL | Status: DC

## 2015-06-22 NOTE — Progress Notes (Addendum)
   Subjective:    Patient ID: Kathryn Cline, female    DOB: 1975/10/22, 40 y.o.   MRN: [id number]  HPI  Patient is a 40 year old female that presents to discuss weight loss options. Patient states she has been working on exercising by using her mobile scooter less and walking with her walker more. Patient states that she has tolerated phentermine well with minimal side effects. However, patient has gained three pounds since starting phentermine on 03/14/2015. Patient is interested in receiving information about other potential weight loss medications. Additional concerns for this visit include addressing increased anxiety and panic attacks. She feels like she needs something as needed when she gets extremely anxious. She did not like the way klonapin made her feel "numb" in the past.  Patient states that she has experienced fatigue, constipation, dry skin and depression.   Review of Systems Please see HPI.     Objective:   Physical Exam  Constitutional: She is oriented to person, place, and time. She appears well-developed and well-nourished. No distress.  Patient's affect is very flat and she seems subdued.   HENT:  Head: Normocephalic and atraumatic.  Left Ear: External ear normal.  Mouth/Throat: Oropharynx is clear and moist.  Patient has a 0.05 x 0.05 cm red macule in the external auditory canal of the right ear. Patient's tympanic membranes are pearly with bony landmarks visible and without erythema bilaterally.   Patient's nasal turbinates are erythematous and edematous with trace rhinorrhea.   Eyes: Conjunctivae and EOM are normal. Pupils are equal, round, and reactive to light. Right eye exhibits no discharge. Left eye exhibits no discharge.  Neck: Normal range of motion.  Cardiovascular: Normal rate, regular rhythm, normal heart sounds and intact distal pulses.   Pulmonary/Chest: Effort normal and breath sounds normal. No respiratory distress. She has no wheezes. She has no rales.   Abdominal: Soft.  Musculoskeletal: Normal range of motion.  Neurological: She is alert and oriented to person, place, and time. No cranial nerve deficit.  Skin: Skin is warm. She is not diaphoretic.  Patient's skin seems excessively dry.   Psychiatric: Her behavior is normal. Judgment and thought content normal.      Assessment & Plan:   1.Morbid Obesity Patient education was provided about diet and exercise. Patient was advised to try swimming at a local [employer] to increase aerobic exercise and to burn more calories/fat. Patient states that she currently drinks four Dr. Samson Frederic a day. Patient was advised to cut soda consumption down by half. Patient education was provided regarding Saxenda including the risks, benefits, and side effects. Patient is currently already taking phentermine and Topamax and cannot tolerate Wellbutrin due to headaches. topamax increased to 66m bid. Will keep on phentermine for one more month while getting saxenda approved. Patient was prescribed Saxenda, pending insurance coverage.  2. Anxiety  Patient states that she has experienced increased anxiety and intermittent panic attacks. Patient was prescribed Ativan to be used PRN. Patient education was provided regarding the abuse potential of benzodiazepines.   3. Depression Patient will continue taking Cymbalta.   4. Health Maintenance  Patient states that she has experienced fatigue, skin dryness, and constipation and obesity which increases the index of suspicion for hypothyroidism. Patient would like to ensure that her TSH is within range. A TSH was ordered. Patient's labs will be reviewed.

## 2015-06-23 LAB — TSH: TSH: 1.62 m[IU]/L

## 2015-06-25 ENCOUNTER — Other Ambulatory Visit: Payer: Self-pay | Admitting: Physician Assistant

## 2015-06-25 MED ORDER — OSELTAMIVIR PHOSPHATE 75 MG PO CAPS: 75.0000 mg | ORAL_CAPSULE | Freq: Two times a day (BID) | ORAL | Status: DC

## 2015-06-25 NOTE — Telephone Encounter (Signed)
Pt called, her husband was diagnosed with flu last week in our office and now she is symptomatic. Questions if Tamiflu can be sent over or if she needs an appt. Will route to PCP.

## 2015-06-25 NOTE — Telephone Encounter (Signed)
Pt advised of Rx.

## 2015-07-05 ENCOUNTER — Ambulatory Visit (HOSPITAL_COMMUNITY): Payer: Self-pay | Admitting: Medical

## 2015-07-05 ENCOUNTER — Telehealth: Payer: Self-pay | Admitting: *Deleted

## 2015-07-05 NOTE — Telephone Encounter (Signed)
Saxenda denied. Denial letter put in providers box.

## 2015-07-12 ENCOUNTER — Encounter (HOSPITAL_COMMUNITY): Payer: Self-pay | Admitting: Medical

## 2015-07-12 ENCOUNTER — Ambulatory Visit (INDEPENDENT_AMBULATORY_CARE_PROVIDER_SITE_OTHER): Payer: BLUE CROSS/BLUE SHIELD | Admitting: Medical

## 2015-07-12 VITALS — BP 128/72 | HR 92 | Ht 69.0 in | Wt 277.0 lb

## 2015-07-12 DIAGNOSIS — F331 Major depressive disorder, recurrent, moderate: Secondary | ICD-10-CM

## 2015-07-12 DIAGNOSIS — F4312 Post-traumatic stress disorder, chronic: Secondary | ICD-10-CM | POA: Diagnosis not present

## 2015-07-12 DIAGNOSIS — T7422XS Child sexual abuse, confirmed, sequela: Secondary | ICD-10-CM

## 2015-07-12 DIAGNOSIS — G894 Chronic pain syndrome: Secondary | ICD-10-CM | POA: Diagnosis not present

## 2015-07-12 DIAGNOSIS — F17200 Nicotine dependence, unspecified, uncomplicated: Secondary | ICD-10-CM

## 2015-07-12 DIAGNOSIS — G4700 Insomnia, unspecified: Secondary | ICD-10-CM

## 2015-07-12 DIAGNOSIS — Z720 Tobacco use: Secondary | ICD-10-CM

## 2015-07-12 DIAGNOSIS — M1610 Unilateral primary osteoarthritis, unspecified hip: Secondary | ICD-10-CM | POA: Diagnosis not present

## 2015-07-12 MED ORDER — DULOXETINE HCL 60 MG PO CPEP: 60.0000 mg | ORAL_CAPSULE | Freq: Every day | ORAL | Status: DC

## 2015-07-12 MED ORDER — TRAZODONE HCL 50 MG PO TABS: 50.0000 mg | ORAL_TABLET | Freq: Every evening | ORAL | Status: DC | PRN
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
## 2015-07-13 ENCOUNTER — Telehealth: Payer: Self-pay

## 2015-07-13 MED ORDER — LORCASERIN HCL 10 MG PO TABS: 10.0000 mg | ORAL_TABLET | Freq: Two times a day (BID) | ORAL | Status: DC

## 2015-07-13 NOTE — Telephone Encounter (Signed)
Kathryn Cline's patient I called patient to let her know her insurance will not pay for Saxenda but it may pay for Belviq. She is willing to try Belviq.

## 2015-07-13 NOTE — Telephone Encounter (Signed)
New rx sent,.

## 2015-07-17 ENCOUNTER — Telehealth: Payer: Self-pay | Admitting: *Deleted

## 2015-07-17 NOTE — Telephone Encounter (Signed)
PA initiated through covermymeds for belviq key # [id number]

## 2015-07-31 NOTE — Telephone Encounter (Signed)
Checked status of PA and form needs to be [employer] form instead of [employer] ( which is the pharmacy vendor on the back of her card) . Resubmitted [employer] form through fax

## 2015-08-01 NOTE — Telephone Encounter (Signed)
belviq approved through insurance from 07/31/15 through 01-26-2016  Pt and pharmacy notified

## 2015-08-07 ENCOUNTER — Ambulatory Visit (INDEPENDENT_AMBULATORY_CARE_PROVIDER_SITE_OTHER): Payer: BLUE CROSS/BLUE SHIELD | Admitting: Sports Medicine

## 2015-08-07 ENCOUNTER — Encounter: Payer: Self-pay | Admitting: Sports Medicine

## 2015-08-07 ENCOUNTER — Encounter: Payer: Self-pay | Admitting: Physician Assistant

## 2015-08-07 ENCOUNTER — Ambulatory Visit (INDEPENDENT_AMBULATORY_CARE_PROVIDER_SITE_OTHER): Payer: BLUE CROSS/BLUE SHIELD | Admitting: Physician Assistant

## 2015-08-07 VITALS — BP 123/78 | HR 93 | Resp 18 | Wt 272.0 lb

## 2015-08-07 VITALS — BP 123/78 | HR 93 | Ht 69.0 in | Wt 272.0 lb

## 2015-08-07 DIAGNOSIS — K429 Umbilical hernia without obstruction or gangrene: Secondary | ICD-10-CM | POA: Diagnosis not present

## 2015-08-07 DIAGNOSIS — G43001 Migraine without aura, not intractable, with status migrainosus: Secondary | ICD-10-CM

## 2015-08-07 DIAGNOSIS — Z0000 Encounter for general adult medical examination without abnormal findings: Secondary | ICD-10-CM

## 2015-08-07 DIAGNOSIS — Z13220 Encounter for screening for lipoid disorders: Secondary | ICD-10-CM | POA: Diagnosis not present

## 2015-08-07 DIAGNOSIS — R059 Cough, unspecified: Secondary | ICD-10-CM

## 2015-08-07 DIAGNOSIS — Z131 Encounter for screening for diabetes mellitus: Secondary | ICD-10-CM

## 2015-08-07 DIAGNOSIS — R05 Cough: Secondary | ICD-10-CM

## 2015-08-07 DIAGNOSIS — M160 Bilateral primary osteoarthritis of hip: Secondary | ICD-10-CM

## 2015-08-07 DIAGNOSIS — I10 Essential (primary) hypertension: Secondary | ICD-10-CM

## 2015-08-07 MED ORDER — ELETRIPTAN HYDROBROMIDE 20 MG PO TABS: 20.0000 mg | ORAL_TABLET | ORAL | Status: DC | PRN

## 2015-08-07 MED ORDER — LORCASERIN HCL 10 MG PO TABS: 10.0000 mg | ORAL_TABLET | Freq: Two times a day (BID) | ORAL | Status: DC

## 2015-08-07 MED ORDER — TOPIRAMATE 50 MG PO TABS: ORAL_TABLET | ORAL | Status: DC

## 2015-08-07 MED ORDER — CYCLOBENZAPRINE HCL 10 MG PO TABS: 10.0000 mg | ORAL_TABLET | Freq: Three times a day (TID) | ORAL | Status: DC | PRN

## 2015-08-07 NOTE — Patient Instructions (Signed)
Keeping You Healthy  Get These Tests 1. Blood Pressure- Have your blood pressure checked once a year by your health care provider.  Normal blood pressure is 120/80. 2. Weight- Have your body mass index (BMI) calculated to screen for obesity.  BMI is measure of body fat based on height and weight.  You can also calculate your own BMI at GravelBags.it. 3. Cholesterol- Have your cholesterol checked every 5 years starting at age 42 then yearly starting at age 51. 56. Chlamydia, HIV, and other sexually transmitted diseases- Get screened every year until age 44, then within three months of each new sexual provider. 5. Pap Test - Every 1-5 years; discuss with your health care provider. 6. Mammogram- Every 1-2 years starting at age 22--50  Take these medicines  Calcium with Vitamin D-Your body needs 1200 mg of Calcium each day and [phone] IU of Vitamin D daily.  Your body can only absorb 500 mg of Calcium at a time so Calcium must be taken in 2 or 3 divided doses throughout the day.  Multivitamin with folic acid- Once daily if it is possible for you to become pregnant.  Get these Immunizations  Gardasil-Series of three doses; prevents HPV related illness such as genital warts and cervical cancer.  Menactra-Single dose; prevents meningitis.  Tetanus shot- Every 10 years.  Flu shot-Every year.  Take these steps 1. Do not smoke-Your healthcare provider can help you quit.  For tips on how to quit go to www.smokefree.gov or call 1-800 QUITNOW. 2. Be physically active- Exercise 5 days a week for at least 30 minutes.  If you are not already physically active, start slow and gradually work up to 30 minutes of moderate physical activity.  Examples of moderate activity include walking briskly, dancing, swimming, bicycling, etc. 3. Breast Cancer- A self breast exam every month is important for early detection of breast cancer.  For more information and instruction on self breast exams, ask your  healthcare provider or https://www.patel.info/. 4. Eat a healthy diet- Eat a variety of healthy foods such as fruits, vegetables, whole grains, low fat milk, low fat cheeses, yogurt, lean meats, poultry and fish, beans, nuts, tofu, etc.  For more information go to www. Thenutritionsource.org 5. Drink alcohol in moderation- Limit alcohol intake to one drink or less per day. Never drink and drive. 6. Depression- Your emotional health is as important as your physical health.  If you're feeling down or losing interest in things you normally enjoy please talk to your healthcare provider about being screened for depression. 7. Dental visit- Brush and floss your teeth twice daily; visit your dentist twice a year. 8. Eye doctor- Get an eye exam at least every 2 years. 9. Helmet use- Always wear a helmet when riding a bicycle, motorcycle, rollerblading or skateboarding. 59. Safe sex- If you may be exposed to sexually transmitted infections, use a condom. 11. Seat belts- Seat belts can save your live; always wear one. 12. Smoke/Carbon Monoxide detectors- These detectors need to be installed on the appropriate level of your home. Replace batteries at least once a year. 13. Skin cancer- When out in the sun please cover up and use sunscreen 15 SPF or higher. 14. Violence- If anyone is threatening or hurting you, please tell your healthcare provider.

## 2015-08-07 NOTE — Assessment & Plan Note (Signed)
Bilateral hip joint injection as above. Return as needed.

## 2015-08-07 NOTE — Progress Notes (Signed)
Subjective:    Patient ID: Kathryn Cline, female    DOB: December 23, 1975, 40 y.o.   MRN: [id number]  HPI    Review of Systems     Objective:   Physical Exam        Assessment & Plan:   Subjective:     Kathryn Cline is a 40 y.o. female and is here for a comprehensive physical exam. The patient reports problems - she is having a bulge coming from belly button off and on and at times painful. does not seem to last. worse with sitting up and lifting. she is working on weight loss and lost 10lbs with belviq. no side effects. she does have a dry cough that started back after restarting lisinopril. her BP was fine off lisinopril but she was starting to get more migraines so restarted. she now has a dry cough. .  Social History   Social History  . Marital Status: Married    Spouse Name: N/A  . Number of Children: N/A  . Years of Education: N/A   Occupational History  . Not on file.   Social History Main Topics  . Smoking status: Former Smoker    Quit date: 03/22/2015  . Smokeless tobacco: Never Used  . Alcohol Use: No  . Drug Use: No  . Sexual Activity:    Partners: Male   Other Topics Concern  . Not on file   Social History Narrative   Health Maintenance  Topic Date Due  . HIV Screening  12/28/1988  . MAMMOGRAM  11/21/2015  . INFLUENZA VACCINE  12/04/2015  . TETANUS/TDAP  07/16/2023    The following portions of the patient's history were reviewed and updated as appropriate: allergies, current medications, past family history, past medical history, past social history, past surgical history and problem list.  Review of Systems Pertinent items noted in HPI and remainder of comprehensive ROS otherwise negative.   Objective:    BP 123/78 mmHg  Pulse 93  Ht 5' 9"  (1.753 m)  Wt 272 lb (123.378 kg)  BMI 40.15 kg/m2 General appearance: alert, cooperative, appears stated age and morbidly obese Head: Normocephalic, without obvious abnormality, atraumatic Eyes:  conjunctivae/corneas clear. PERRL, EOM's intact. Fundi benign. Ears: normal TM's and external ear canals both ears Nose: Nares normal. Septum midline. Mucosa normal. No drainage or sinus tenderness. Throat: lips, mucosa, and tongue normal; teeth and gums normal Neck: no adenopathy, no carotid bruit, no JVD, supple, symmetrical, trachea midline and thyroid not enlarged, symmetric, no tenderness/mass/nodules Back: symmetric, no curvature. ROM normal. No CVA tenderness. Lungs: clear to auscultation bilaterally Heart: regular rate and rhythm, S1, S2 normal, no murmur, click, rub or gallop Abdomen: normal findings: bowel sounds normal, no organomegaly, soft, non-tender and umbilicus bulge approximately 2cm and reducible. tender to palpation. and see above. Extremities: extremities normal, atraumatic, no cyanosis or edema Pulses: 2+ and symmetric Skin: Skin color, texture, turgor normal. No rashes or lesions Lymph nodes: Cervical, supraclavicular, and axillary nodes normal. Neurologic: Alert and oriented X 3, normal strength and tone. Normal symmetric reflexes. Normal coordination and gait    Assessment:    Healthy female exam.      Plan:    CPE- fasting labs ordered. No need for pap smear due to total hysterectomy. Normal mammogram 11/2014. Hx of elevated TG. Will recheck. Continue to work on weight loss.   Obesity- belviq bid. Continue making changes diet wise and staying active.   Migraine- stop maxalt. Try relpax for rescue. Increased topamax  for prevention. Follow up in 2 weeks.   Cough- sounds like ace cough. Stop lisinopril. See if cough resolves. Recheck 2 weeks.   HTN- BP looks great today. Will recheck in 2 weeks after stopping lisinopril.   umbilicus hernia- will refer for surgeon to evaluate. Discussed red flags of strangulation and to call if this occurs.  See After Visit Summary for Counseling Recommendations

## 2015-08-07 NOTE — Progress Notes (Signed)
  Subjective:    CC: Hip pain  HPI: This is a pleasant 40 year old female with known bilateral hip osteoarthritis, previous injection was over 4-5 months ago, now with a recurrence of pain, moderate, persistent localized in the hip joint, desires repeat interventional treatment today.  Past medical history, Surgical history, Family history not pertinant except as noted below, Social history, Allergies, and medications have been entered into the medical record, reviewed, and no changes needed.   Review of Systems: No fevers, chills, night sweats, weight loss, chest pain, or shortness of breath.   Objective:    General: Well Developed, well nourished, and in no acute distress.  Neuro: Alert and oriented x3, extra-ocular muscles intact, sensation grossly intact.  HEENT: Normocephalic, atraumatic, pupils equal round reactive to light, neck supple, no masses, no lymphadenopathy, thyroid nonpalpable.  Skin: Warm and dry, no rashes. Cardiac: Regular rate and rhythm, no murmurs rubs or gallops, no lower extremity edema.  Respiratory: Clear to auscultation bilaterally. Not using accessory muscles, speaking in full sentences.  Procedure: Real-time Ultrasound Guided Injection of right hip joint Device: GE Logiq E  Verbal informed consent obtained.  Time-out conducted.  Noted no overlying erythema, induration, or other signs of local infection.  Skin prepped in a sterile fashion.  Local anesthesia: Topical Ethyl chloride.  With sterile technique and under real time ultrasound guidance:  1 mL kenalog 40, 2 mL lidocaine, 2 mL Marcaine injected easily Completed without difficulty  Pain immediately resolved suggesting accurate placement of the medication.  Advised to call if fevers/chills, erythema, induration, drainage, or persistent bleeding.  Images permanently stored and available for review in the ultrasound unit.  Impression: Technically successful ultrasound guided injection.  Procedure:  Real-time Ultrasound Guided Injection of left hip joint Device: GE Logiq E  Verbal informed consent obtained.  Time-out conducted.  Noted no overlying erythema, induration, or other signs of local infection.  Skin prepped in a sterile fashion.  Local anesthesia: Topical Ethyl chloride.  With sterile technique and under real time ultrasound guidance:  1 mL kenalog 40, 2 mL lidocaine, 2 mL Marcaine injected easily Completed without difficulty  Pain immediately resolved suggesting accurate placement of the medication.  Advised to call if fevers/chills, erythema, induration, drainage, or persistent bleeding.  Images permanently stored and available for review in the ultrasound unit.  Impression: Technically successful ultrasound guided injection.  Impression and Recommendations:

## 2015-08-08 ENCOUNTER — Telehealth: Payer: Self-pay | Admitting: *Deleted

## 2015-08-08 DIAGNOSIS — I10 Essential (primary) hypertension: Secondary | ICD-10-CM | POA: Insufficient documentation

## 2015-08-08 DIAGNOSIS — K430 Incisional hernia with obstruction, without gangrene: Secondary | ICD-10-CM | POA: Insufficient documentation

## 2015-08-08 DIAGNOSIS — R05 Cough: Secondary | ICD-10-CM | POA: Insufficient documentation

## 2015-08-08 DIAGNOSIS — R059 Cough, unspecified: Secondary | ICD-10-CM | POA: Insufficient documentation

## 2015-08-08 DIAGNOSIS — G43001 Migraine without aura, not intractable, with status migrainosus: Secondary | ICD-10-CM | POA: Insufficient documentation

## 2015-08-08 LAB — COMPLETE METABOLIC PANEL WITH GFR
ALBUMIN: 4.7 g/dL (ref 3.6–5.1)
ALK PHOS: 58 U/L (ref 33–115)
ALT: 33 U/L — ABNORMAL HIGH (ref 6–29)
AST: 29 U/L (ref 10–30)
BUN: 8 mg/dL (ref 7–25)
CHLORIDE: 101 mmol/L (ref 98–110)
CO2: 22 mmol/L (ref 20–31)
Calcium: 9.6 mg/dL (ref 8.6–10.2)
Creat: 0.71 mg/dL (ref 0.50–1.10)
GFR, Est African American: >89 mL/min (ref >=60)
GLUCOSE: 92 mg/dL (ref 65–99)
POTASSIUM: 3.7 mmol/L (ref 3.5–5.3)
SODIUM: 139 mmol/L (ref 135–146)
Total Bilirubin: 0.8 mg/dL (ref 0.2–1.2)
Total Protein: 7.3 g/dL (ref 6.1–8.1)

## 2015-08-08 LAB — LIPID PANEL
CHOL/HDL RATIO: 5.9 ratio — AB (ref <=5.0)
Cholesterol: 135 mg/dL (ref 125–200)
HDL: 23 mg/dL — AB (ref >=46)
LDL CALC: 44 mg/dL (ref <130)
Triglycerides: 341 mg/dL — ABNORMAL HIGH (ref <150)
VLDL: 68 mg/dL — AB (ref <30)

## 2015-08-08 MED ORDER — ICOSAPENT ETHYL 1 G PO CAPS: ORAL_CAPSULE | ORAL | Status: DC

## 2015-08-10 DIAGNOSIS — M79604 Pain in right leg: Secondary | ICD-10-CM | POA: Diagnosis not present

## 2015-08-10 DIAGNOSIS — R278 Other lack of coordination: Secondary | ICD-10-CM | POA: Diagnosis not present

## 2015-08-13 DIAGNOSIS — M79604 Pain in right leg: Secondary | ICD-10-CM | POA: Diagnosis not present

## 2015-08-13 DIAGNOSIS — R278 Other lack of coordination: Secondary | ICD-10-CM | POA: Diagnosis not present

## 2015-08-15 NOTE — Telephone Encounter (Signed)
PA initiated and denied for Vascepa  Another PA was submitted called and checked on the status of this PA and the rep states its still pending review until 45-18

## 2015-08-16 DIAGNOSIS — G894 Chronic pain syndrome: Secondary | ICD-10-CM | POA: Diagnosis not present

## 2015-08-16 DIAGNOSIS — M545 Low back pain: Secondary | ICD-10-CM | POA: Diagnosis not present

## 2015-08-16 DIAGNOSIS — M47816 Spondylosis without myelopathy or radiculopathy, lumbar region: Secondary | ICD-10-CM | POA: Diagnosis not present

## 2015-08-16 DIAGNOSIS — M1288 Other specific arthropathies, not elsewhere classified, other specified site: Secondary | ICD-10-CM | POA: Diagnosis not present

## 2015-08-17 ENCOUNTER — Encounter (HOSPITAL_BASED_OUTPATIENT_CLINIC_OR_DEPARTMENT_OTHER): Payer: Self-pay

## 2015-08-17 ENCOUNTER — Emergency Department (HOSPITAL_BASED_OUTPATIENT_CLINIC_OR_DEPARTMENT_OTHER)
Admission: EM | Admit: 2015-08-17 | Discharge: 2015-08-17 | Disposition: A | Discharge status: Home / Self Care | Payer: BLUE CROSS/BLUE SHIELD | Attending: Emergency Medicine | Admitting: Emergency Medicine

## 2015-08-17 DIAGNOSIS — Z87891 Personal history of nicotine dependence: Secondary | ICD-10-CM | POA: Insufficient documentation

## 2015-08-17 DIAGNOSIS — R110 Nausea: Secondary | ICD-10-CM | POA: Insufficient documentation

## 2015-08-17 DIAGNOSIS — N94819 Vulvodynia, unspecified: Secondary | ICD-10-CM | POA: Diagnosis not present

## 2015-08-17 DIAGNOSIS — I10 Essential (primary) hypertension: Secondary | ICD-10-CM | POA: Insufficient documentation

## 2015-08-17 DIAGNOSIS — R1909 Other intra-abdominal and pelvic swelling, mass and lump: Secondary | ICD-10-CM | POA: Diagnosis not present

## 2015-08-17 DIAGNOSIS — R102 Pelvic and perineal pain: Secondary | ICD-10-CM | POA: Diagnosis not present

## 2015-08-17 LAB — URINALYSIS, ROUTINE W REFLEX MICROSCOPIC
BILIRUBIN URINE: NEGATIVE
Glucose, UA: NEGATIVE mg/dL
Hgb urine dipstick: NEGATIVE
KETONES UR: NEGATIVE mg/dL
NITRITE: NEGATIVE
PH: 6.5 (ref 5.0–8.0)
PROTEIN: NEGATIVE mg/dL
Specific Gravity, Urine: 1.009 (ref 1.005–1.030)

## 2015-08-17 LAB — WET PREP, GENITAL
Clue Cells Wet Prep HPF POC: NONE SEEN
SPERM: NONE SEEN
TRICH WET PREP: NONE SEEN
YEAST WET PREP: NONE SEEN

## 2015-08-17 LAB — URINE MICROSCOPIC-ADD ON

## 2015-08-17 MED ORDER — PREDNISONE 50 MG PO TABS
60.0000 mg | ORAL_TABLET | Freq: Once | ORAL | Status: AC
  Administered 2015-08-17: 60 mg via ORAL
  Filled 2015-08-17: qty 1

## 2015-08-17 MED ORDER — PREDNISONE 10 MG PO TABS: ORAL_TABLET | ORAL | Status: DC

## 2015-08-17 MED ORDER — DIPHENHYDRAMINE HCL 25 MG PO TABS: 25.0000 mg | ORAL_TABLET | Freq: Four times a day (QID) | ORAL | Status: AC

## 2015-08-17 MED ORDER — DIPHENHYDRAMINE HCL 25 MG PO CAPS
25.0000 mg | ORAL_CAPSULE | Freq: Once | ORAL | Status: AC
  Administered 2015-08-17: 25 mg via ORAL
  Filled 2015-08-17: qty 1

## 2015-08-17 NOTE — ED Provider Notes (Signed)
CSN: [id number]     Arrival date & time 08/17/15  1901 History   First MD Initiated Contact with Patient 08/17/15 2004     Chief Complaint  Patient presents with  . Groin Swelling     (Consider location/radiation/quality/duration/timing/severity/associated sxs/prior Treatment) HPI Comments: Patient with history of HTN, fibromyalgia, GERD, neuropathy presents with painful vulvar swelling and redness. She states that symptoms started about a week ago after a "romantic weekend" with her husband where they used a vibrator for the 2 days previous to onset of symptoms. She states she has never used any type of "sex toy" in the past. No vaginal discharge or fever. She reports external vaginal burning with urination but denies frequency or hematuria.   The history is provided by the patient. No language interpreter was used.    Past Medical History  Diagnosis Date  . Neuropathy ([place])   . Fibromyalgia   . GERD (gastroesophageal reflux disease)   . Allergy   . Hypertension    Past Surgical History  Procedure Laterality Date  . Appendectomy    . Abdominal hysterectomy    . Wrist surgery    . Left foot surgery    . Cyst removal trunk    . Cholecystectomy     Family History  Problem Relation Age of Onset  . Heart attack Brother   . Hypertension Brother   . Heart attack Maternal Grandmother   . Hypertension Maternal Grandmother   . Cancer Maternal Grandfather   . Heart attack Maternal Grandfather   . Hypertension Maternal Grandfather   . Cancer Paternal Grandmother   . Heart attack Paternal Grandmother   . Hypertension Paternal Grandmother   . Stroke Paternal Grandmother   . Heart attack Paternal Grandfather   . Hypertension Paternal Grandfather   . Hypertension Brother    Social History  Substance Use Topics  . Smoking status: Former Smoker    Quit date: 03/22/2015  . Smokeless tobacco: Never Used  . Alcohol Use: No   OB History    No data available     Review of  Systems  Constitutional: Negative for fever.  Gastrointestinal: Positive for nausea. Negative for vomiting and abdominal pain.  Genitourinary: Positive for vaginal pain. Negative for vaginal discharge and pelvic pain.  Musculoskeletal: Negative for myalgias and back pain.  Skin: Negative for rash.      Allergies  Oxycodone-acetaminophen; Vicodin; Viibryd; Vilazodone; Amitriptyline; Brintellix; Bupropion; Codeine; Lyrica; Ace inhibitors; Azithromycin; Gabapentin; Chlordiazepoxide-amitriptyline; and Effexor  Home Medications   Prior to Admission medications   Medication Sig Start Date End Date Taking? Authorizing Provider  albuterol (PROAIR HFA) 108 (90 BASE) MCG/ACT inhaler Inhale 1-2 puffs into the lungs every 6 (six) hours as needed for wheezing or shortness of breath. 03/14/15   [name] [name], PA-C  AMBULATORY NON FORMULARY MEDICATION Tens units   Dx: chronic pain, fibromyalgia 02/03/14   Donella Stade, PA-C  AMBULATORY NON FORMULARY MEDICATION Dx: Rollator walker.   For neuropathy, hx of falls, chronic pain, Lumbar DDD, fibromyalgia. 04/20/15   [name] [name], PA-C  azelastine (ASTELIN) 0.1 % nasal spray Place 2 sprays into both nostrils 2 (two) times daily. Use in each nostril as directed 05/08/15   [name], MD  cetirizine (ZYRTEC) 10 MG tablet Take 10 mg by mouth daily.    Historical Provider, MD  Cholecalciferol (VITAMIN D3) 5000 UNITS TABS Take by mouth.    Historical Provider, MD  cyclobenzaprine (FLEXERIL) 10 MG tablet Take 1 tablet (  10 mg total) by mouth 3 (three) times daily as needed for muscle spasms. 08/07/15   [name] [name], PA-C  diclofenac (VOLTAREN) 75 MG EC tablet Take by mouth. 01/19/14   Historical Provider, MD  diclofenac sodium (VOLTAREN) 1 % GEL Apply topically. 08/19/13   Historical Provider, MD  DULoxetine (CYMBALTA) 60 MG capsule Take 1 capsule (60 mg total) by mouth daily. 07/12/15 07/11/16  [name], PA-C  eletriptan (RELPAX) 20 MG tablet  Take 1 tablet (20 mg total) by mouth as needed for migraine (Use at the first sign of migraine, may repeat x1 in 2h if needed.). One tablet by mouth at onset of headache. May repeat in 2 hours if headache persists or recurs. 08/07/15   [name] [name], PA-C  fluticasone (FLONASE) 50 MCG/ACT nasal spray One spray in each nostril twice a day, use left hand for right nostril, and right hand for left nostril. 05/03/15   [name], MD  furosemide (LASIX) 40 MG tablet Take 1 tablet (40 mg total) by mouth daily. 06/20/15   [name] [name], PA-C  Icosapent Ethyl 1 g CAPS Take 2 tablets twice a day. 08/08/15   [name] [name], PA-C  ipratropium-albuterol (DUONEB) 0.5-2.5 (3) MG/3ML SOLN Take 3 mLs by nebulization every 4 (four) hours as needed. 01/26/15   [name] [name], PA-C  LORazepam (ATIVAN) 0.5 MG tablet Take 1 tablet (0.5 mg total) by mouth every 8 (eight) hours as needed for anxiety. 06/22/15   [name] [name], PA-C  Lorcaserin HCl (BELVIQ) 10 MG TABS Take 10 mg by mouth 2 (two) times daily. 08/07/15   [name] [name], PA-C  montelukast (SINGULAIR) 10 MG tablet Take 1 tablet (10 mg total) by mouth at bedtime. 06/20/15   [name] [name], PA-C  Olopatadine HCl (PATANASE) 0.6 % SOLN 2 sprays each nostril twice a day. 05/04/15   [name] [name], PA-C  pantoprazole (PROTONIX) 40 MG tablet Take 1 tablet (40 mg total) by mouth 2 (two) times daily. 06/09/14   [name] [name], PA-C  promethazine (PHENERGAN) 25 MG tablet Take by mouth. 08/19/13   Historical Provider, MD  topiramate (TOPAMAX) 50 MG tablet Take two tablets twice a day. 08/07/15   [name] [name], PA-C  traZODone (DESYREL) 50 MG tablet Take 1 tablet (50 mg total) by mouth at bedtime as needed and may repeat dose one time if needed for sleep. 07/12/15   [name], PA-C   BP 148/98 mmHg  Pulse 96  Temp(Src) 99.4 F (37.4 C) (Oral)  Resp 22  SpO2 98% Physical Exam  Constitutional: She is oriented to person, place, and time. She appears  well-developed and well-nourished.  Neck: Normal range of motion.  Pulmonary/Chest: Effort normal.  Genitourinary:  Cervix absent. No discharge in vaginal vault. There is vulvar redness and mild swelling without rash or lesion. Tender to touch. No adnexal tenderness.   Neurological: She is alert and oriented to person, place, and time.  Skin: Skin is warm and dry.    ED Course  Procedures (including critical care time) Labs Review Labs Reviewed  URINALYSIS, ROUTINE W REFLEX MICROSCOPIC (NOT AT [facility]) - Abnormal; Notable for the following:    Leukocytes, UA TRACE (*)    All other components within normal limits  URINE MICROSCOPIC-ADD ON - Abnormal; Notable for the following:    Squamous Epithelial / LPF 0-5 (*)    Bacteria, UA RARE (*)    All other components within normal limits  WET PREP, GENITAL  GC/CHLAMYDIA PROBE AMP ([place]) NOT AT [facility]   Results for orders placed or performed during the hospital encounter of 08/17/15  Wet prep, genital  Result Value Ref Range   Yeast Wet Prep HPF POC NONE SEEN NONE SEEN   Trich, Wet Prep NONE SEEN NONE SEEN   Clue Cells Wet Prep HPF POC NONE SEEN NONE SEEN   WBC, Wet Prep HPF POC FEW (A) NONE SEEN   Sperm NONE SEEN   Urinalysis, Routine w reflex microscopic (not at [facility])  Result Value Ref Range   Color, Urine YELLOW YELLOW   APPearance CLEAR CLEAR   Specific Gravity, Urine 1.009 1.005 - 1.030   pH 6.5 5.0 - 8.0   Glucose, UA NEGATIVE NEGATIVE mg/dL   Hgb urine dipstick NEGATIVE NEGATIVE   Bilirubin Urine NEGATIVE NEGATIVE   Ketones, ur NEGATIVE NEGATIVE mg/dL   Protein, ur NEGATIVE NEGATIVE mg/dL   Nitrite NEGATIVE NEGATIVE   Leukocytes, UA TRACE (A) NEGATIVE  Urine microscopic-add on  Result Value Ref Range   Squamous Epithelial / LPF 0-5 (A) NONE SEEN   WBC, UA 0-5 0 - 5 WBC/hpf   RBC / HPF 0-5 0 - 5 RBC/hpf   Bacteria, UA RARE (A) NONE SEEN    Imaging Review No results found. I have personally reviewed and evaluated  these images and lab results as part of my medical decision-making.   EKG Interpretation None      MDM   Final diagnoses:  None    1. Vulvodynia  The patient is having vulvar pain and swelling since use of intravaginal sexual aid several days ago. No discharge, fever. No evidence UTI, vaginal infection. She is s/p hysterectomy. No evidence yeast and, as well, she has tried several OTC yeast medications with worsening symptoms. Suspect possible reaction to some aspect of sexual aid. Will provide pain relief, prednisone taper, antihistamine. Recommend GYN follow up for recheck in 2-3 days.  Charlann Lange, PA-C 08/17/15 2121  Gareth Morgan, MD 08/19/15 1424

## 2015-08-17 NOTE — ED Notes (Signed)
C/o vaginal swelling, burning and pain x 1 week,  Radiating into abd   Denies urina freq

## 2015-08-17 NOTE — Discharge Instructions (Signed)
Contact Dermatitis Dermatitis is redness, soreness, and swelling (inflammation) of the skin. Contact dermatitis is a reaction to certain substances that touch the skin. There are two types of contact dermatitis:   Irritant contact dermatitis. This type is caused by something that irritates your skin, such as dry hands from washing them too much. This type does not require previous exposure to the substance for a reaction to occur. This type is more common.  Allergic contact dermatitis. This type is caused by a substance that you are allergic to, such as a nickel allergy or poison ivy. This type only occurs if you have been exposed to the substance (allergen) before. Upon a repeat exposure, your body reacts to the substance. This type is less common. CAUSES  Many different substances can cause contact dermatitis. Irritant contact dermatitis is most commonly caused by exposure to:   Makeup.   Soaps.   Detergents.   Bleaches.   Acids.   Metal salts, such as nickel.  Allergic contact dermatitis is most commonly caused by exposure to:   Poisonous plants.   Chemicals.   Jewelry.   Latex.   Medicines.   Preservatives in products, such as clothing.  RISK FACTORS This condition is more likely to develop in:   People who have jobs that expose them to irritants or allergens.  People who have certain medical conditions, such as asthma or eczema.  SYMPTOMS  Symptoms of this condition may occur anywhere on your body where the irritant has touched you or is touched by you. Symptoms include:  Dryness or flaking.   Redness.   Cracks.   Itching.   Pain or a burning feeling.   Blisters.  Drainage of small amounts of blood or clear fluid from skin cracks. With allergic contact dermatitis, there may also be swelling in areas such as the eyelids, mouth, or genitals.  DIAGNOSIS  This condition is diagnosed with a medical history and physical exam. A patch skin test  may be performed to help determine the cause. If the condition is related to your job, you may need to see an occupational medicine specialist. TREATMENT Treatment for this condition includes figuring out what caused the reaction and protecting your skin from further contact. Treatment may also include:   Steroid creams or ointments. Oral steroid medicines may be needed in more severe cases.  Antibiotics or antibacterial ointments, if a skin infection is present.  Antihistamine lotion or an antihistamine taken by mouth to ease itching.  A bandage (dressing). HOME CARE INSTRUCTIONS Skin Care  Moisturize your skin as needed.   Apply cool compresses to the affected areas.  Try taking a bath with:  Epsom salts. Follow the instructions on the packaging. You can get these at your local pharmacy or grocery store.  Baking soda. Pour a small amount into the bath as directed by your health care provider.  Colloidal oatmeal. Follow the instructions on the packaging. You can get this at your local pharmacy or grocery store.  Try applying baking soda paste to your skin. Stir water into baking soda until it reaches a paste-like consistency.  Do not scratch your skin.  Bathe less frequently, such as every other day.  Bathe in lukewarm water. Avoid using hot water. Medicines  Take or apply over-the-counter and prescription medicines only as told by your health care provider.   If you were prescribed an antibiotic medicine, take or apply your antibiotic as told by your health care provider. Do not stop using the  antibiotic even if your condition starts to improve. General Instructions  Keep all follow-up visits as told by your health care provider. This is important.  Avoid the substance that caused your reaction. If you do not know what caused it, keep a journal to try to track what caused it. Write down:  What you eat.  What cosmetic products you use.  What you drink.  What  you wear in the affected area. This includes jewelry.  If you were given a dressing, take care of it as told by your health care provider. This includes when to change and remove it. SEEK MEDICAL CARE IF:   Your condition does not improve with treatment.  Your condition gets worse.  You have signs of infection such as swelling, tenderness, redness, soreness, or warmth in the affected area.  You have a fever.  You have new symptoms. SEEK IMMEDIATE MEDICAL CARE IF:   You have a severe headache, neck pain, or neck stiffness.  You vomit.  You feel very sleepy.  You notice red streaks coming from the affected area.  Your bone or joint underneath the affected area becomes painful after the skin has healed.  The affected area turns darker.  You have difficulty breathing.   This information is not intended to replace advice given to you by your health care provider. Make sure you discuss any questions you have with your health care provider.   Document Released: 04/18/2000 Document Revised: 01/10/2015 Document Reviewed: 09/06/2014 Elsevier Interactive Patient Education [employer].

## 2015-08-17 NOTE — ED Notes (Signed)
C/o vaginal swelling x 1 week-denies injury-swelling started after using new sex toy-NAD

## 2015-08-20 ENCOUNTER — Ambulatory Visit (INDEPENDENT_AMBULATORY_CARE_PROVIDER_SITE_OTHER): Payer: BLUE CROSS/BLUE SHIELD | Admitting: Physician Assistant

## 2015-08-20 ENCOUNTER — Encounter: Payer: Self-pay | Admitting: Physician Assistant

## 2015-08-20 VITALS — BP 129/74 | HR 83 | Ht 69.0 in | Wt 276.0 lb

## 2015-08-20 DIAGNOSIS — N94819 Vulvodynia, unspecified: Secondary | ICD-10-CM | POA: Diagnosis not present

## 2015-08-20 DIAGNOSIS — N9089 Other specified noninflammatory disorders of vulva and perineum: Secondary | ICD-10-CM | POA: Insufficient documentation

## 2015-08-20 DIAGNOSIS — N907 Vulvar cyst: Secondary | ICD-10-CM

## 2015-08-20 LAB — GC/CHLAMYDIA PROBE AMP (~~LOC~~) NOT AT ARMC
Chlamydia: NEGATIVE
Neisseria Gonorrhea: NEGATIVE

## 2015-08-20 NOTE — Progress Notes (Signed)
   Subjective:    Patient ID: Kathryn Cline, female    DOB: March 26, 1976, 40 y.o.   MRN: [id number]  HPI Pt presents to the clinic to follow up after ED visit on 08/17/15 for vaginal irritation and burning due to sex penis ring that she was using with her husband. She thinks it was rubber that she responded to.  She was given anti-histamine and prednisone. She does feel much better. She comes in for recheck. She notes she is very sexually active. Wanting sex at least 4 times a day but on average having sex once or twice a day. She has not had sex in one week due to vaginal irriation. Wet prep was negative. Pending GC/Chlamydia.   She also has a skin tag that she would like to have removed off her labia. It is very irritated and bleeds frequently.   Her cough has completely resolved after stopping lisinopril. Doing well. Blood pressure controlled. No CP, palpitations, headaches or dizziness.    Review of Systems  All other systems reviewed and are negative.      Objective:   Physical Exam  Constitutional: She is oriented to person, place, and time. She appears well-developed and well-nourished.  Obese.   HENT:  Head: Normocephalic and atraumatic.  Cardiovascular: Normal rate, regular rhythm and normal heart sounds.   Pulmonary/Chest: Effort normal and breath sounds normal.  Genitourinary:     Neurological: She is alert and oriented to person, place, and time.  Psychiatric: She has a normal mood and affect. Her behavior is normal.          Assessment & Plan:  Cough- resolved with stopping ACE inhibitor. BP looks great. Follow up as needed. ACE cough added to list.   vulvadyna- seems to be resolving. Finish prednisone. Consider coconut oil for lubrication. Follow up if not improving.   Skin tag labia- Skin Tag Removal Procedure Note  Pre-operative Diagnosis: Classic skin tags (acrochordon)  Post-operative Diagnosis: Classic skin tags (acrochordon)  Locations:left  labia  Indications: irritation and bleeding  Anesthesia: ethyl chloride Procedure Details  The risks (including bleeding and infection) and benefits of the procedure and Verbal informed consent obtained. Using sterile iris scissors, one skin tag were snipped off at their bases after cleansing with Betadine.  Bleeding was controlled by pressure.   Findings: Pathognomonic benign lesions  not sent for pathological exam.  Condition: Stable  Complications: none.  Plan: 1. Instructed to keep the wounds dry and covered for 24-48h and clean thereafter. 2. Warning signs of infection were reviewed.   3. Recommended that the patient use OTC acetaminophen as needed for pain.  4. Return as needed.

## 2015-08-21 ENCOUNTER — Other Ambulatory Visit: Payer: Self-pay | Admitting: Surgery

## 2015-08-21 DIAGNOSIS — K430 Incisional hernia with obstruction, without gangrene: Secondary | ICD-10-CM | POA: Diagnosis not present

## 2015-08-21 DIAGNOSIS — Z01818 Encounter for other preprocedural examination: Secondary | ICD-10-CM | POA: Diagnosis not present

## 2015-08-21 NOTE — Telephone Encounter (Signed)
PA was denied for vascepa. Needs to have tried and failed lovaza. Denial letter placed in Jade's box

## 2015-08-21 NOTE — H&P (Signed)
Kathryn Cline 08/21/2015 11:29 AM Location: [facility] Patient #: [id number] DOB: 15-Jul-1975 Married / Language: English / Race: White Female  History of Present Illness [name] MD; 08/21/2015 12:14 PM) The patient is a 40 year old female who presents for an evaluation of a who presents for an evaluation of a hernia.   Patient Care Team: Donella Stade, PA-C as PCP - General (Family Medicine) Michael Boston, MD as Consulting Physician (General Surgery) [name], MD as Consulting Physician (Family Medicine) [name], PA-C as Physician Assistant (Psychiatry)  Additional reasons for visit:  Incisional hernia is described as the following: Note for "Incisional hernia": Patient sent by [name], physician assistant, over concerns of painful hernia and abdomen. Request for surgical consultation.  Pleasant morbidly obese woman. Comes today with her daughter. Has numerous health issues including morbid obesity and fibromyalgia. Neuralgias. PTSD. Has had a few abdominal surgeries in her life. Had ovarian cysts removed and his teenager. Had a hysterectomy and appendectomy in the past. Had a laparoscopic cholecystectomy about 10 years ago [place] [facility]. Sounds like she had large stones. Since noted a lump just below her belly button in the past few months. Causes pain and discomfort. He's been intentionally trying to lose weight and exercise more. Unfortunately she's had been worsening hip and joint pain that is getting therapy for that. Activity level has gone down. Was even needing a walker for a while but seems to be getting better. She quit smoking last year. She is worked hard to not go back on it. Moves her bowels about 3 or 4 times a day. No severe constipation or diarrhea. Because of the worsening abdominal discomfort, concern for hernia. She did have pains last year. Had a hernia noted then in February 2016. Incarcerated with  omentum only. She wishes to consider surgery if she does have a hernia as it is causing her discomfort.   Other Problems [name], CMA; 08/21/2015 11:29 AM) Arthritis Asthma Back Pain Depression Gastroesophageal Reflux Disease Hypercholesterolemia Migraine Headache  Past Surgical History [name], CMA; 08/21/2015 11:29 AM) Cesarean Section - 1 Gallbladder Surgery - Open Hysterectomy (not due to cancer) - Complete  Diagnostic Studies History [name], CMA; 08/21/2015 11:29 AM) Colonoscopy >10 years ago Mammogram within last year  Allergies [name], CMA; 08/21/2015 11:33 AM) OxyCODONE HCl *ANALGESICS - OPIOID* Itching. Vicodin *ANALGESICS - OPIOID* Nausea, Vomiting, Itching. Viibryd *ANTIDEPRESSANTS* Shortness of breath, Swelling. Amitriptyline HCl *CHEMICALS* Rash. Brintellix *ANTIDEPRESSANTS* Nausea, Vomiting. Codeine Sulfate *ANALGESICS - OPIOID* Lyrica *ANTICONVULSANTS* Swelling. Azithromycin *CHEMICALS* Gabapentin *CHEMICALS* Chlordiazepoxide-Amitriptyline *PSYCHOTHERAPEUTIC AND NEUROLOGICAL AGENTS - MISC.* Nausea. Effexor *ANTIDEPRESSANTS*  Medication History ([name], CMA; 08/21/2015 11:41 AM) Cyclobenzaprine HCl (10MG Tablet, Oral) Active. DULoxetine HCl (60MG Capsule DR Part, Oral) Active. Furosemide (40MG Tablet, Oral) Active. LORazepam (0.5MG Tablet, Oral) Active. Belviq (10MG Tablet, Oral) Active. Montelukast Sodium (10MG Tablet, Oral) Active. Topiramate (50MG Tablet, Oral) Active. TraZODone HCl (50MG Tablet, Oral) Active. ProAir HFA (108 (90 Base)MCG/ACT Aerosol Soln, Inhalation) Active. Astelin (137MCG/SPRAY Solution, Nasal) Active. ZyrTEC Allergy (10MG Tablet, Oral) Active. Vitamin D3 (5000UNIT Tablet, Oral) Active. Voltaren (75MG Tablet DR, Oral) Active. Voltaren (1% Gel, Transdermal) Active. Benadryl (25MG Tablet, Oral) Active. Relpax (20MG Tablet, Oral) Active. Flonase  (50MCG/ACT Suspension, Nasal) Active. DuoNeb (0.5-2.5 (3)MG/3ML Solution, Inhalation) Active. Patanase (0.6% Solution, Nasal) Active. Protonix (40MG Tablet DR, Oral) Active. PredniSONE (10MG Tablet, Oral) Active. Phenergan (25MG Tablet, Oral) Active. Medications Reconciled  Social History Illene Regulus, CMA; 08/21/2015 11:29 AM)  Caffeine use Coffee, Tea. No alcohol use No drug use Tobacco use Former smoker.  Family History Illene Regulus, CMA; 08/21/2015 11:29 AM) Depression Brother, Mother. Heart Disease Brother, Father, Mother. Heart disease in female family member before age 66 Heart disease in female family member before age 97 Prostate Cancer Father. Respiratory Condition Brother.  Pregnancy / Birth History Illene Regulus, CMA; 08/21/2015 11:29 AM) Age at menarche 50 years. Gravida 5 Maternal age 93-25 Para 1     Review of Systems [name] CMA; 08/21/2015 11:29 AM) General Not Present- Appetite Loss, Chills, Fatigue, Fever, Night Sweats, Weight Gain and Weight Loss. Skin Not Present- Change in Wart/Mole, Dryness, Hives, Jaundice, New Lesions, Non-Healing Wounds, Rash and Ulcer. HEENT Present- Seasonal Allergies and Wears glasses/contact lenses. Not Present- Earache, Hearing Loss, Hoarseness, Nose Bleed, Oral Ulcers, Ringing in the Ears, Sinus Pain, Sore Throat, Visual Disturbances and Yellow Eyes. Respiratory Not Present- Bloody sputum, Chronic Cough, Difficulty Breathing, Snoring and Wheezing. Breast Not Present- Breast Mass, Breast Pain, Nipple Discharge and Skin Changes. Cardiovascular Not Present- Chest Pain, Difficulty Breathing Lying Down, Leg Cramps, Palpitations, Rapid Heart Rate, Shortness of Breath and Swelling of Extremities. Gastrointestinal Not Present- Abdominal Pain, Bloating, Bloody Stool, Change in Bowel Habits, Chronic diarrhea, Constipation, Difficulty Swallowing, Excessive gas, Gets full quickly at meals, Hemorrhoids,  Indigestion, Nausea, Rectal Pain and Vomiting. Female Genitourinary Not Present- Frequency, Nocturia, Painful Urination, Pelvic Pain and Urgency. Musculoskeletal Present- Back Pain and Joint Pain. Not Present- Joint Stiffness, Muscle Pain, Muscle Weakness and Swelling of Extremities. Neurological Present- Headaches. Not Present- Decreased Memory, Fainting, Numbness, Seizures, Tingling, Tremor, Trouble walking and Weakness. Psychiatric Present- Depression. Not Present- Anxiety, Bipolar, Change in Sleep Pattern, Fearful and Frequent crying. Endocrine Present- Hot flashes. Not Present- Cold Intolerance, Excessive Hunger, Hair Changes, Heat Intolerance and New Diabetes. Hematology Not Present- Easy Bruising, Excessive bleeding, Gland problems, HIV and Persistent Infections.  Vitals ([name] CMA; 08/21/2015 11:30 AM) 08/21/2015 11:29 AM Weight: 276 lb Height: 69in Body Surface Area: 2.37 m Body Mass Index: 40.76 kg/m  Pulse: 90 (Regular)  BP: 134/78 (Sitting, Left Arm, Standard)      Physical Exam [name] MD; 08/21/2015 12:11 PM)  Abdomen Note: Review obese but soft. Moderate panniculus. 6 x 5 cm mass felt just inferior to the umbilicus. Sensitive. Not reducible. Diastases. No abdominal pain or discomfort. Panniculus clean with good hygiene. No rash or breakdown.  Female Genitourinary Note: Good hygiene under panniculus. No inguinal hernias.  Neuropsychiatric Note: Somewhat reserved/guarded with responses. Did seem to open up at the end.    Assessment & Plan [name] MD; 08/21/2015 12:15 PM)  Mickel Duhamel HERNIA (K43.0) Impression: Periumbilical/infraumbilical lump with tenderness and pain correlating with obvious hernia on CAT scan from last year. Larger on exam than expected on CT scan.  I think she would benefit from repair given the fact has gotten larger and causing pain and discomfort. Risk of recurrences rather high in the setting  of morbid obesity & panniculus, but she is at least not smoking anymore. We'll try and lower the chance of recurrence using a larger sheet of mesh.  With her fibromyalgia and chronic pain and other issues, expected recovery will take several months until the soreness goes down. She is at risk for chronic pain. However we'll try and minimize the chance of having problems. Aggressive field block with liposomal bupivacaine. Binder. Tylenol around-the-clock. Try and find a narcotic her pain medication that she can actually tolerate. Hopefully she can tolerate  at least tramadol since she has issues with most other narcotics and a large myriad of other medications.  ENCOUNTER FOR PRE-OPERATIVE EXAMINATION - [place] (I71.245)  Current Plans You are being scheduled for surgery - Our schedulers will call you.  You should hear from our office's scheduling department within 5 working days about the location, date, and time of surgery. We try to make accommodations for patient's preferences in scheduling surgery, but sometimes the OR schedule or the surgeon's schedule prevents [place] from making those accommodations.  If you have not heard from our office [phone]) in 5 working days, call the office and ask for your surgeon's nurse.  If you have other questions about your diagnosis, plan, or surgery, call the office and ask for your surgeon's nurse.  Written instructions provided The anatomy & physiology of the abdominal wall was discussed. The pathophysiology of hernias was discussed. Natural history risks without surgery including progeressive enlargement, pain, incarceration, & strangulation was discussed. Contributors to complications such as smoking, obesity, diabetes, prior surgery, etc were discussed.  I feel the risks of no intervention will lead to serious problems that outweigh the operative risks; therefore, I recommended surgery to reduce and repair the hernia. I explained laparoscopic techniques with  possible need for an open approach. I noted the probable use of mesh to patch and/or buttress the hernia repair  Risks such as bleeding, infection, abscess, need for further treatment, heart attack, death, and other risks were discussed. I noted a good likelihood this will help address the problem. Goals of post-operative recovery were discussed as well. Possibility that this will not correct all symptoms was explained. I stressed the importance of low-impact activity, aggressive pain control, avoiding constipation, & not pushing through pain to minimize risk of post-operative chronic pain or injury. Possibility of reherniation especially with smoking, obesity, diabetes, immunosuppression, and other health conditions was discussed. We will work to minimize complications.  An educational handout further explaining the pathology & treatment options was given as well. Questions were answered. The patient expresses understanding & wishes to proceed with surgery.  Pt Education - Pamphlet Given - Laparoscopic Hernia Repair: discussed with patient and provided information. Pt Education - [facility] Hernia Post-Op HCI (Dasie Chancellor): discussed with patient and provided information. Pt Education - [facility] Pain Control (Jourdyn Ferrin)

## 2015-08-23 ENCOUNTER — Other Ambulatory Visit: Payer: Self-pay | Admitting: *Deleted

## 2015-08-23 MED ORDER — OMEGA-3-ACID ETHYL ESTERS 1 G PO CAPS: ORAL_CAPSULE | ORAL | Status: DC

## 2015-08-29 ENCOUNTER — Other Ambulatory Visit: Payer: Self-pay

## 2015-08-29 MED ORDER — PANTOPRAZOLE SODIUM 40 MG PO TBEC: 40.0000 mg | DELAYED_RELEASE_TABLET | Freq: Two times a day (BID) | ORAL | Status: DC

## 2015-09-11 ENCOUNTER — Encounter (HOSPITAL_COMMUNITY)
Admission: RE | Admit: 2015-09-11 | Discharge: 2015-09-11 | Disposition: A | Discharge status: Home / Self Care | Payer: BLUE CROSS/BLUE SHIELD | Source: Ambulatory Visit | Attending: Surgery | Admitting: Surgery

## 2015-09-11 ENCOUNTER — Encounter (HOSPITAL_COMMUNITY): Payer: Self-pay

## 2015-09-11 DIAGNOSIS — K430 Incisional hernia with obstruction, without gangrene: Secondary | ICD-10-CM | POA: Diagnosis not present

## 2015-09-11 DIAGNOSIS — Z79899 Other long term (current) drug therapy: Secondary | ICD-10-CM | POA: Diagnosis not present

## 2015-09-11 DIAGNOSIS — Z7952 Long term (current) use of systemic steroids: Secondary | ICD-10-CM | POA: Diagnosis not present

## 2015-09-11 DIAGNOSIS — F329 Major depressive disorder, single episode, unspecified: Secondary | ICD-10-CM | POA: Diagnosis not present

## 2015-09-11 DIAGNOSIS — J45909 Unspecified asthma, uncomplicated: Secondary | ICD-10-CM | POA: Diagnosis not present

## 2015-09-11 DIAGNOSIS — I10 Essential (primary) hypertension: Secondary | ICD-10-CM | POA: Diagnosis not present

## 2015-09-11 DIAGNOSIS — G8929 Other chronic pain: Secondary | ICD-10-CM | POA: Diagnosis not present

## 2015-09-11 DIAGNOSIS — M1990 Unspecified osteoarthritis, unspecified site: Secondary | ICD-10-CM | POA: Diagnosis not present

## 2015-09-11 DIAGNOSIS — G43909 Migraine, unspecified, not intractable, without status migrainosus: Secondary | ICD-10-CM | POA: Diagnosis not present

## 2015-09-11 DIAGNOSIS — Z6841 Body Mass Index (BMI) 40.0 and over, adult: Secondary | ICD-10-CM | POA: Diagnosis not present

## 2015-09-11 DIAGNOSIS — Z87891 Personal history of nicotine dependence: Secondary | ICD-10-CM | POA: Diagnosis not present

## 2015-09-11 DIAGNOSIS — M797 Fibromyalgia: Secondary | ICD-10-CM | POA: Diagnosis not present

## 2015-09-11 DIAGNOSIS — K219 Gastro-esophageal reflux disease without esophagitis: Secondary | ICD-10-CM | POA: Diagnosis not present

## 2015-09-11 HISTORY — DX: Depression, unspecified: F32.A

## 2015-09-11 HISTORY — DX: Headache: R51

## 2015-09-11 HISTORY — DX: Nicotine dependence, unspecified, uncomplicated: F17.200

## 2015-09-11 HISTORY — DX: Morbid (severe) obesity due to excess calories: E66.01

## 2015-09-11 HISTORY — DX: Cough, unspecified: R05.9

## 2015-09-11 HISTORY — DX: Unspecified lump in the right breast, unspecified quadrant: N63.10

## 2015-09-11 HISTORY — DX: Cough: R05

## 2015-09-11 HISTORY — DX: Unspecified fall, initial encounter: W19.XXXA

## 2015-09-11 HISTORY — DX: Child sexual abuse, confirmed, initial encounter: T74.22XA

## 2015-09-11 HISTORY — DX: Lumbago with sciatica, left side: M54.42

## 2015-09-11 HISTORY — DX: Major depressive disorder, single episode, unspecified: F32.9

## 2015-09-11 HISTORY — DX: Pure hyperglyceridemia: E78.1

## 2015-09-11 HISTORY — DX: Anxiety disorder, unspecified: F41.9

## 2015-09-11 HISTORY — DX: Unspecified asthma, uncomplicated: J45.909

## 2015-09-11 HISTORY — DX: Umbilical hernia without obstruction or gangrene: K42.9

## 2015-09-11 HISTORY — DX: Chronic rhinitis: J31.0

## 2015-09-11 HISTORY — DX: Postmenopausal atrophic vaginitis: N95.2

## 2015-09-11 HISTORY — DX: Post-traumatic stress disorder, unspecified: F43.10

## 2015-09-11 HISTORY — DX: Unspecified fracture of right foot, initial encounter for closed fracture: S92.901A

## 2015-09-11 HISTORY — DX: Vulvodynia, unspecified: N94.819

## 2015-09-11 HISTORY — DX: Unspecified osteoarthritis, unspecified site: M19.90

## 2015-09-11 HISTORY — DX: Other specified noninflammatory disorders of vulva and perineum: N90.89

## 2015-09-11 HISTORY — DX: Headache, unspecified: R51.9

## 2015-09-11 HISTORY — DX: Chronic pain syndrome: G89.4

## 2015-09-11 LAB — BASIC METABOLIC PANEL
Anion gap: 12 (ref 5–15)
BUN: 8 mg/dL (ref 6–20)
CHLORIDE: 106 mmol/L (ref 101–111)
CO2: 24 mmol/L (ref 22–32)
CREATININE: 0.62 mg/dL (ref 0.44–1.00)
Calcium: 9.2 mg/dL (ref 8.9–10.3)
GFR calc Af Amer: >60 mL/min (ref >60)
GFR calc non Af Amer: >60 mL/min (ref >60)
Glucose, Bld: 110 mg/dL — ABNORMAL HIGH (ref 65–99)
Potassium: 3.7 mmol/L (ref 3.5–5.1)
Sodium: 142 mmol/L (ref 135–145)

## 2015-09-11 LAB — CBC
HCT: 39.2 % (ref 36.0–46.0)
Hemoglobin: 13.1 g/dL (ref 12.0–15.0)
MCH: 30 pg (ref 26.0–34.0)
MCHC: 33.4 g/dL (ref 30.0–36.0)
MCV: 89.7 fL (ref 78.0–100.0)
PLATELETS: 242 10*3/uL (ref 150–400)
RBC: 4.37 MIL/uL (ref 3.87–5.11)
RDW: 14.1 % (ref 11.5–15.5)
WBC: 9 10*3/uL (ref 4.0–10.5)

## 2015-09-11 NOTE — Progress Notes (Signed)
EKG per chart 06/10/2015 Pt states surgeon/Dr Gross informed did not have to stop Bleviq prior to surgical date

## 2015-09-11 NOTE — Patient Instructions (Addendum)
Kathryn Cline  09/11/2015   Your procedure is scheduled on: Thursday Sep 13, 2015  Report to [facility] Main  Entrance take [place]  elevators to 3rd floor to  [place] at 9:00 AM.  Call this number if you have problems the morning of surgery [phone]   Remember: ONLY 1 PERSON MAY GO WITH YOU TO SHORT STAY TO GET  READY MORNING OF [place].  Do not eat food or drink liquids :After Midnight.     Take these medicines the morning of surgery with A SIP OF WATER: Cetirizine (Zyrtec) if needed; May use albuterol inhaler if needed (bring with you day of surgery); May use Axelastine (Astelin) nasal spray if needed(bring with you day of surgery); Duloxetine (Cymbalta); May use Flonase nasal spray if needed (bring with you day of surgery); Hydrocodone-Acetaminophen if needed; May have nebulizer treatment if needed; Lorazepam (Ativan) if needed; Singulair if needed; Topiramate                               You may not have any metal on your body including hair pins and              piercings  Do not wear jewelry, make-up, lotions, powders or perfumes, deodorant             Do not wear nail polish.  Do not shave  48 hours prior to surgery.              Do not bring valuables to the hospital. [place].  Contacts, dentures or bridgework may not be worn into surgery.  Leave suitcase in the car. After surgery it may be brought to your room.    _____________________________________________________________________             [facility] - Preparing for Surgery Before surgery, you can play an important role.  Because skin is not sterile, your skin needs to be as free of germs as possible.  You can reduce the number of germs on your skin by washing with CHG (chlorahexidine gluconate) soap before surgery.  CHG is an antiseptic cleaner which kills germs and bonds with the skin to continue killing germs even after  washing. Please DO NOT use if you have an allergy to CHG or antibacterial soaps.  If your skin becomes reddened/irritated stop using the CHG and inform your nurse when you arrive at Short Stay. Do not shave (including legs and underarms) for at least 48 hours prior to the first CHG shower.  You may shave your face/neck. Please follow these instructions carefully:  1.  Shower with CHG Soap the night before surgery and the  morning of Surgery.  2.  If you choose to wash your hair, wash your hair first as usual with your  normal  shampoo.  3.  After you shampoo, rinse your hair and body thoroughly to remove the  shampoo.                           4.  Use CHG as you would any other liquid soap.  You can apply chg directly  to the skin and wash  Gently with a scrungie or clean washcloth.  5.  Apply the CHG Soap to your body ONLY FROM THE NECK DOWN.   Do not use on face/ open                           Wound or open sores. Avoid contact with eyes, ears mouth and genitals (private parts).                       Wash face,  Genitals (private parts) with your normal soap.             6.  Wash thoroughly, paying special attention to the area where your surgery  will be performed.  7.  Thoroughly rinse your body with warm water from the neck down.  8.  DO NOT shower/wash with your normal soap after using and rinsing off  the CHG Soap.                9.  Pat yourself dry with a clean towel.            10.  Wear clean pajamas.            11.  Place clean sheets on your bed the night of your first shower and do not  sleep with pets. Day of Surgery : Do not apply any lotions/deodorants the morning of surgery.  Please wear clean clothes to the hospital/surgery center.  FAILURE TO FOLLOW THESE INSTRUCTIONS MAY RESULT IN THE CANCELLATION OF YOUR SURGERY PATIENT SIGNATURE_________________________________  NURSE  SIGNATURE__________________________________  ________________________________________________________________________

## 2015-09-12 MED ORDER — DEXTROSE 5 % IV SOLN
3.0000 g | INTRAVENOUS | Status: AC
  Administered 2015-09-13: 3 g via INTRAVENOUS
  Filled 2015-09-12: qty 3

## 2015-09-13 ENCOUNTER — Ambulatory Visit (HOSPITAL_COMMUNITY): Payer: BLUE CROSS/BLUE SHIELD | Admitting: Certified Registered Nurse Anesthetist

## 2015-09-13 ENCOUNTER — Encounter (HOSPITAL_COMMUNITY): Payer: Self-pay | Admitting: *Deleted

## 2015-09-13 ENCOUNTER — Ambulatory Visit (HOSPITAL_COMMUNITY)
Admission: RE | Admit: 2015-09-13 | Discharge: 2015-09-13 | Disposition: A | Discharge status: Home / Self Care | Payer: BLUE CROSS/BLUE SHIELD | Source: Ambulatory Visit | Attending: Surgery | Admitting: Surgery

## 2015-09-13 ENCOUNTER — Encounter (HOSPITAL_COMMUNITY)
Admission: RE | Disposition: A | Discharge status: Home / Self Care | Payer: Self-pay | Source: Ambulatory Visit | Attending: Surgery

## 2015-09-13 DIAGNOSIS — Z87891 Personal history of nicotine dependence: Secondary | ICD-10-CM | POA: Diagnosis not present

## 2015-09-13 DIAGNOSIS — Z8719 Personal history of other diseases of the digestive system: Secondary | ICD-10-CM

## 2015-09-13 DIAGNOSIS — Z9889 Other specified postprocedural states: Secondary | ICD-10-CM

## 2015-09-13 DIAGNOSIS — Z6841 Body Mass Index (BMI) 40.0 and over, adult: Secondary | ICD-10-CM | POA: Diagnosis not present

## 2015-09-13 DIAGNOSIS — K219 Gastro-esophageal reflux disease without esophagitis: Secondary | ICD-10-CM | POA: Insufficient documentation

## 2015-09-13 DIAGNOSIS — J45909 Unspecified asthma, uncomplicated: Secondary | ICD-10-CM | POA: Insufficient documentation

## 2015-09-13 DIAGNOSIS — I10 Essential (primary) hypertension: Secondary | ICD-10-CM | POA: Insufficient documentation

## 2015-09-13 DIAGNOSIS — M1990 Unspecified osteoarthritis, unspecified site: Secondary | ICD-10-CM | POA: Diagnosis not present

## 2015-09-13 DIAGNOSIS — M797 Fibromyalgia: Secondary | ICD-10-CM | POA: Diagnosis present

## 2015-09-13 DIAGNOSIS — Z7952 Long term (current) use of systemic steroids: Secondary | ICD-10-CM | POA: Insufficient documentation

## 2015-09-13 DIAGNOSIS — Z79899 Other long term (current) drug therapy: Secondary | ICD-10-CM | POA: Insufficient documentation

## 2015-09-13 DIAGNOSIS — G43909 Migraine, unspecified, not intractable, without status migrainosus: Secondary | ICD-10-CM | POA: Diagnosis not present

## 2015-09-13 DIAGNOSIS — K430 Incisional hernia with obstruction, without gangrene: Secondary | ICD-10-CM | POA: Diagnosis not present

## 2015-09-13 DIAGNOSIS — F329 Major depressive disorder, single episode, unspecified: Secondary | ICD-10-CM | POA: Insufficient documentation

## 2015-09-13 DIAGNOSIS — G8929 Other chronic pain: Secondary | ICD-10-CM | POA: Diagnosis not present

## 2015-09-13 HISTORY — PX: INSERTION OF MESH: SHX5868

## 2015-09-13 HISTORY — DX: Personal history of other diseases of the digestive system: Z87.19

## 2015-09-13 HISTORY — PX: VENTRAL HERNIA REPAIR: SHX424

## 2015-09-13 SURGERY — REPAIR, HERNIA, VENTRAL, LAPAROSCOPIC
Anesthesia: General

## 2015-09-13 MED ORDER — LIDOCAINE HCL (CARDIAC) 20 MG/ML IV SOLN
INTRAVENOUS | Status: AC
  Filled 2015-09-13: qty 5

## 2015-09-13 MED ORDER — MIDAZOLAM HCL 2 MG/2ML IJ SOLN
INTRAMUSCULAR | Status: AC
  Filled 2015-09-13: qty 2

## 2015-09-13 MED ORDER — FENTANYL CITRATE (PF) 100 MCG/2ML IJ SOLN
INTRAMUSCULAR | Status: DC | PRN
  Administered 2015-09-13 (×3): 50 ug via INTRAVENOUS
  Administered 2015-09-13: 100 ug via INTRAVENOUS

## 2015-09-13 MED ORDER — 0.9 % SODIUM CHLORIDE (POUR BTL) OPTIME
TOPICAL | Status: DC | PRN
  Administered 2015-09-13: 1000 mL

## 2015-09-13 MED ORDER — HYDROMORPHONE HCL 2 MG PO TABS
2.0000 mg | ORAL_TABLET | ORAL | Status: AC
  Administered 2015-09-13: 2 mg via ORAL
  Filled 2015-09-13: qty 1

## 2015-09-13 MED ORDER — PROPOFOL 10 MG/ML IV BOLUS
INTRAVENOUS | Status: DC | PRN
  Administered 2015-09-13: 200 mg via INTRAVENOUS

## 2015-09-13 MED ORDER — BUPIVACAINE LIPOSOME 1.3 % IJ SUSP
INTRAMUSCULAR | Status: DC | PRN
  Administered 2015-09-13: 20 mL

## 2015-09-13 MED ORDER — ROCURONIUM BROMIDE 50 MG/5ML IV SOLN
INTRAVENOUS | Status: AC
  Filled 2015-09-13: qty 1

## 2015-09-13 MED ORDER — KETOROLAC TROMETHAMINE 30 MG/ML IJ SOLN
INTRAMUSCULAR | Status: DC | PRN
  Administered 2015-09-13: 30 mg via INTRAVENOUS

## 2015-09-13 MED ORDER — DEXAMETHASONE SODIUM PHOSPHATE 10 MG/ML IJ SOLN
INTRAMUSCULAR | Status: AC
  Filled 2015-09-13: qty 1

## 2015-09-13 MED ORDER — SUCCINYLCHOLINE CHLORIDE 20 MG/ML IJ SOLN
INTRAMUSCULAR | Status: DC | PRN
  Administered 2015-09-13: 100 mg via INTRAVENOUS

## 2015-09-13 MED ORDER — KETOROLAC TROMETHAMINE 30 MG/ML IJ SOLN
INTRAMUSCULAR | Status: AC
  Filled 2015-09-13: qty 1

## 2015-09-13 MED ORDER — BUPIVACAINE LIPOSOME 1.3 % IJ SUSP
20.0000 mL | INTRAMUSCULAR | Status: DC
  Filled 2015-09-13: qty 20

## 2015-09-13 MED ORDER — PROPOFOL 10 MG/ML IV BOLUS
INTRAVENOUS | Status: AC
  Filled 2015-09-13: qty 20

## 2015-09-13 MED ORDER — HYDROMORPHONE HCL 1 MG/ML IJ SOLN: 0.2500 mg | INTRAMUSCULAR | Status: DC | PRN

## 2015-09-13 MED ORDER — ONDANSETRON HCL 4 MG/2ML IJ SOLN
INTRAMUSCULAR | Status: DC | PRN
  Administered 2015-09-13: 4 mg via INTRAVENOUS

## 2015-09-13 MED ORDER — DEXAMETHASONE SODIUM PHOSPHATE 10 MG/ML IJ SOLN
INTRAMUSCULAR | Status: DC | PRN
  Administered 2015-09-13: 10 mg via INTRAVENOUS

## 2015-09-13 MED ORDER — ONDANSETRON HCL 4 MG/2ML IJ SOLN
INTRAMUSCULAR | Status: AC
  Filled 2015-09-13: qty 2

## 2015-09-13 MED ORDER — HYDROMORPHONE HCL 2 MG/ML IJ SOLN
INTRAMUSCULAR | Status: AC
  Filled 2015-09-13: qty 1

## 2015-09-13 MED ORDER — HYDROMORPHONE HCL 4 MG PO TABS: 2.0000 mg | ORAL_TABLET | ORAL | Status: DC | PRN

## 2015-09-13 MED ORDER — SUGAMMADEX SODIUM 500 MG/5ML IV SOLN
INTRAVENOUS | Status: AC
  Filled 2015-09-13: qty 5

## 2015-09-13 MED ORDER — BUPIVACAINE-EPINEPHRINE (PF) 0.25% -1:200000 IJ SOLN
INTRAMUSCULAR | Status: AC
  Filled 2015-09-13: qty 30

## 2015-09-13 MED ORDER — LACTATED RINGERS IV SOLN
INTRAVENOUS | Status: DC
  Administered 2015-09-13 (×2): via INTRAVENOUS
  Administered 2015-09-13: 1000 mL via INTRAVENOUS

## 2015-09-13 MED ORDER — BUPIVACAINE-EPINEPHRINE 0.25% -1:200000 IJ SOLN
INTRAMUSCULAR | Status: DC | PRN
  Administered 2015-09-13: 80 mL

## 2015-09-13 MED ORDER — HYDROMORPHONE HCL 1 MG/ML IJ SOLN
INTRAMUSCULAR | Status: DC | PRN
  Administered 2015-09-13 (×2): 1 mg via INTRAVENOUS

## 2015-09-13 MED ORDER — FENTANYL CITRATE (PF) 250 MCG/5ML IJ SOLN
INTRAMUSCULAR | Status: AC
  Filled 2015-09-13: qty 5

## 2015-09-13 MED ORDER — ROCURONIUM BROMIDE 100 MG/10ML IV SOLN
INTRAVENOUS | Status: DC | PRN
  Administered 2015-09-13: 5 mg via INTRAVENOUS
  Administered 2015-09-13 (×2): 10 mg via INTRAVENOUS
  Administered 2015-09-13: 45 mg via INTRAVENOUS

## 2015-09-13 MED ORDER — SUGAMMADEX SODIUM 500 MG/5ML IV SOLN
INTRAVENOUS | Status: DC | PRN
  Administered 2015-09-13: 250 mg via INTRAVENOUS

## 2015-09-13 MED ORDER — MIDAZOLAM HCL 5 MG/5ML IJ SOLN
INTRAMUSCULAR | Status: DC | PRN
  Administered 2015-09-13: 2 mg via INTRAVENOUS

## 2015-09-13 MED ORDER — LIDOCAINE HCL (CARDIAC) 20 MG/ML IV SOLN
INTRAVENOUS | Status: DC | PRN
  Administered 2015-09-13: 100 mg via INTRAVENOUS

## 2015-09-13 MED ORDER — STERILE WATER FOR IRRIGATION IR SOLN
Status: DC | PRN
  Administered 2015-09-13: 1000 mL

## 2015-09-13 SURGICAL SUPPLY — 43 items
APPLIER CLIP 5 13 M/L LIGAMAX5 (MISCELLANEOUS)
BINDER ABDOMINAL 12 ML 46-62 (SOFTGOODS) ×2 IMPLANT
CABLE HIGH FREQUENCY MONO STRZ (ELECTRODE) ×2 IMPLANT
CHLORAPREP W/TINT 26ML (MISCELLANEOUS) ×2 IMPLANT
CLIP APPLIE 5 13 M/L LIGAMAX5 (MISCELLANEOUS) IMPLANT
CLOSURE STERI-STRIP 1/4X4 (GAUZE/BANDAGES/DRESSINGS) ×2 IMPLANT
COVER SURGICAL LIGHT HANDLE (MISCELLANEOUS) ×2 IMPLANT
DECANTER SPIKE VIAL GLASS SM (MISCELLANEOUS) ×2 IMPLANT
DEVICE SECURE STRAP 25 ABSORB (INSTRUMENTS) ×2 IMPLANT
DEVICE TROCAR PUNCTURE CLOSURE (ENDOMECHANICALS) ×2 IMPLANT
DRAPE LAPAROSCOPIC ABDOMINAL (DRAPES) ×2 IMPLANT
DRAPE WARM FLUID 44X44 (DRAPE) ×2 IMPLANT
DRSG TEGADERM 2-3/8X2-3/4 SM (GAUZE/BANDAGES/DRESSINGS) ×2 IMPLANT
DRSG TEGADERM 4X4.75 (GAUZE/BANDAGES/DRESSINGS) IMPLANT
ELECT REM PT RETURN 9FT ADLT (ELECTROSURGICAL) ×2
ELECTRODE REM PT RTRN 9FT ADLT (ELECTROSURGICAL) ×1 IMPLANT
GAUZE SPONGE 2X2 8PLY STRL LF (GAUZE/BANDAGES/DRESSINGS) ×1 IMPLANT
GLOVE ECLIPSE 8.0 STRL XLNG CF (GLOVE) ×2 IMPLANT
GLOVE INDICATOR 8.0 STRL GRN (GLOVE) ×2 IMPLANT
GOWN STRL REUS W/TWL XL LVL3 (GOWN DISPOSABLE) ×4 IMPLANT
KIT BASIN OR (CUSTOM PROCEDURE TRAY) ×2 IMPLANT
MARKER SKIN DUAL TIP RULER LAB (MISCELLANEOUS) ×2 IMPLANT
MESH VENTRALIGHT ST 8X10 (Mesh General) ×2 IMPLANT
NEEDLE SPNL 22GX3.5 QUINCKE BK (NEEDLE) IMPLANT
PAD POSITIONING PINK XL (MISCELLANEOUS) ×2 IMPLANT
POSITIONER SURGICAL ARM (MISCELLANEOUS) IMPLANT
SCISSORS LAP 5X35 DISP (ENDOMECHANICALS) ×2 IMPLANT
SET IRRIG TUBING LAPAROSCOPIC (IRRIGATION / IRRIGATOR) IMPLANT
SHEARS HARMONIC ACE PLUS 36CM (ENDOMECHANICALS) IMPLANT
SLEEVE SURGEON STRL (DRAPES) ×2 IMPLANT
SLEEVE XCEL OPT CAN 5 100 (ENDOMECHANICALS) ×4 IMPLANT
SPONGE GAUZE 2X2 STER 10/PKG (GAUZE/BANDAGES/DRESSINGS) ×1
STRIP CLOSURE SKIN 1/2X4 (GAUZE/BANDAGES/DRESSINGS) ×4 IMPLANT
SUT MNCRL AB 4-0 PS2 18 (SUTURE) ×4 IMPLANT
SUT PDS AB 1 CT1 27 (SUTURE) ×10 IMPLANT
SUT PROLENE 1 CT 1 30 (SUTURE) ×20 IMPLANT
TAPE CLOTH 4X10 WHT NS (GAUZE/BANDAGES/DRESSINGS) IMPLANT
TOWEL OR 17X26 10 PK STRL BLUE (TOWEL DISPOSABLE) ×2 IMPLANT
TRAY FOLEY CATH 14FR (SET/KITS/TRAYS/PACK) ×2 IMPLANT
TRAY LAPAROSCOPIC (CUSTOM PROCEDURE TRAY) ×2 IMPLANT
TROCAR BLADELESS OPT 5 100 (ENDOMECHANICALS) ×2 IMPLANT
TROCAR XCEL NON-BLD 11X100MML (ENDOMECHANICALS) IMPLANT
TUBING INSUF HEATED (TUBING) ×2 IMPLANT

## 2015-09-13 NOTE — Anesthesia Procedure Notes (Signed)
Procedure Name: Intubation Date/Time: 09/13/2015 10:53 AM Performed by: Noralyn Pick D Pre-anesthesia Checklist: Patient identified, Emergency Drugs available, Suction available and Patient being monitored Patient Re-evaluated:Patient Re-evaluated prior to inductionOxygen Delivery Method: Circle System Utilized Preoxygenation: Pre-oxygenation with 100% oxygen Intubation Type: IV induction Ventilation: Mask ventilation without difficulty Laryngoscope Size: Mac and 4 Grade View: Grade I Tube type: Oral Tube size: 7.5 mm Number of attempts: 1 Airway Equipment and Method: Stylet and Oral airway Placement Confirmation: ETT inserted through vocal cords under direct vision,  positive ETCO2 and breath sounds checked- equal and bilateral Secured at: 22 cm Tube secured with: Tape Dental Injury: Teeth and Oropharynx as per pre-operative assessment

## 2015-09-13 NOTE — Anesthesia Postprocedure Evaluation (Signed)
Anesthesia Post Note  Patient: Kathryn Cline  Procedure(s) Performed: Procedure(s) (LRB): LAPAROSCOPIC VENTRAL HERNIA (N/A) INSERTION OF MESH (N/A)  Patient location during evaluation: PACU Anesthesia Type: General Level of consciousness: awake and alert Pain management: pain level controlled Vital Signs Assessment: post-procedure vital signs reviewed and stable Respiratory status: spontaneous breathing, nonlabored ventilation and respiratory function stable Cardiovascular status: blood pressure returned to baseline and stable Postop Assessment: no signs of nausea or vomiting Anesthetic complications: no    Last Vitals:  Filed Vitals:   09/13/15 1500 09/13/15 1516  BP: 131/90 130/79  Pulse: 105 104  Temp: 36.8 C 36.6 C  Resp: 13 16    Last Pain:  Filed Vitals:   09/13/15 1519  PainSc: 0-No pain                 Kaelen Caughlin,W. EDMOND

## 2015-09-13 NOTE — Anesthesia Preprocedure Evaluation (Addendum)
Anesthesia Evaluation  Patient identified by MRN, date of birth, ID band Patient awake    Reviewed: Allergy & Precautions, H&P , NPO status , Patient's Chart, lab work & pertinent test results  Airway Mallampati: II  TM Distance: >3 FB Neck ROM: Full    Dental no notable dental hx. (+) Teeth Intact, Dental Advisory Given   Pulmonary asthma , former smoker,    Pulmonary exam normal breath sounds clear to auscultation       Cardiovascular hypertension, Pt. on medications  Rhythm:Regular Rate:Normal     Neuro/Psych  Headaches, PSYCHIATRIC DISORDERS Anxiety Depression PTSD   GI/Hepatic Neg liver ROS, GERD  Medicated and Controlled,  Endo/Other  Morbid obesity  Renal/GU negative Renal ROS  negative genitourinary   Musculoskeletal  (+) Arthritis , Osteoarthritis,  Fibromyalgia -  Abdominal   Peds  Hematology negative hematology ROS (+)   Anesthesia Other Findings   Reproductive/Obstetrics negative OB ROS                            Anesthesia Physical Anesthesia Plan  ASA: III  Anesthesia Plan: General   Post-op Pain Management:    Induction: Intravenous  Airway Management Planned: Oral ETT  Additional Equipment:   Intra-op Plan:   Post-operative Plan: Extubation in OR  Informed Consent: I have reviewed the patients History and Physical, chart, labs and discussed the procedure including the risks, benefits and alternatives for the proposed anesthesia with the patient or authorized representative who has indicated his/her understanding and acceptance.   Dental advisory given  Plan Discussed with: CRNA  Anesthesia Plan Comments:         Anesthesia Quick Evaluation

## 2015-09-13 NOTE — Transfer of Care (Signed)
Immediate Anesthesia Transfer of Care Note  Patient: Kathryn Cline  Procedure(s) Performed: Procedure(s): LAPAROSCOPIC VENTRAL HERNIA (N/A) INSERTION OF MESH (N/A)  Patient Location: PACU  Anesthesia Type:General  Level of Consciousness: awake, alert  and oriented  Airway & Oxygen Therapy: Patient Spontanous Breathing and Patient connected to face mask oxygen  Post-op Assessment: Report given to RN and Post -op Vital signs reviewed and stable  Post vital signs: Reviewed and stable  Last Vitals:  Filed Vitals:   09/13/15 0905  BP: 137/92  Pulse: 85  Temp: 36.4 C  Resp: 18    Last Pain: There were no vitals filed for this visit.    Patients Stated Pain Goal: 3 (59/97/74 1423)  Complications: No apparent anesthesia complications

## 2015-09-13 NOTE — Interval H&P Note (Signed)
History and Physical Interval Note:  09/13/2015 9:53 AM  Kathryn Cline  has presented today for surgery, with the diagnosis of Hernia in abdomen  The various methods of treatment have been discussed with the patient and family. After consideration of risks, benefits and other options for treatment, the patient has consented to  Procedure(s): LAPAROSCOPIC VENTRAL HERNIA (N/A) INSERTION OF MESH (N/A) as a surgical intervention .  The patient's history has been reviewed, patient examined, no change in status, stable for surgery.  I have reviewed the patient's chart and labs.  Questions were answered to the patient's satisfaction.     Beulah Capobianco C.

## 2015-09-13 NOTE — Discharge Instructions (Signed)
HERNIA REPAIR: POST OP INSTRUCTIONS  1. DIET: Follow a light bland diet the first 24 hours after arrival home, such as soup, liquids, crackers, etc.  Be sure to include lots of fluids daily.  Avoid fast food or heavy meals as your are more likely to get nauseated.  Eat a low fat the next few days after surgery. 2. Take your usually prescribed home medications unless otherwise directed. 3. PAIN CONTROL: a. Pain is best controlled by a usual combination of three different methods TOGETHER: i. Ice/Heat ii. Over the counter pain medication iii. Prescription pain medication b. Most patients will experience some swelling and bruising around the hernia(s) such as the bellybutton, groins, or old incisions.  Ice packs or heating pads (30-60 minutes up to 6 times a day) will help. Use ice for the first few days to help decrease swelling and bruising, then switch to heat to help relax tight/sore spots and speed recovery.  Some people prefer to use ice alone, heat alone, alternating between ice & heat.  Experiment to what works for you.  Swelling and bruising can take several weeks to resolve.   c. It is helpful to take an over-the-counter pain medication regularly for the first few weeks.  Choose one of the following that works best for you: i. Naproxen (Aleve, etc)  Two 225m tabs twice a day ii. Ibuprofen (Advil, etc) Three 2035mtabs four times a day (every meal & bedtime) iii. Acetaminophen (Tylenol, etc) 325-65046mour times a day (every meal & bedtime) d. A  prescription for pain medication should be given to you upon discharge.  Take your pain medication as prescribed.  i. If you are having problems/concerns with the prescription medicine (does not control pain, nausea, vomiting, rash, itching, etc), please call us [phone] see if we need to switch you to a different pain medicine that will work better for you and/or control your side effect better. ii. If you need a refill on your pain  medication, please contact your pharmacy.  They will contact our office to request authorization. Prescriptions will not be filled after 5 pm or on week-ends. 4. Avoid getting constipated.  Between the surgery and the pain medications, it is common to experience some constipation.  Increasing fluid intake and taking a fiber supplement (such as Metamucil, Citrucel, FiberCon, MiraLax, etc) 1-2 times a day regularly will usually help prevent this problem from occurring.  A mild laxative (prune juice, Milk of Magnesia, MiraLax, etc) should be taken according to package directions if there are no bowel movements after 48 hours.   5. Wash / shower every day.  You may shower over the dressings as they are waterproof.   6. Remove your waterproof bandages 5 days after surgery.  You may leave the incision open to air.  You may replace a dressing/Band-Aid to cover the incision for comfort if you wish.  Continue to shower over incision(s) after the dressing is off.    7. ACTIVITIES as tolerated:   a. You may resume regular (light) daily activities beginning the next day--such as daily self-care, walking, climbing stairs--gradually increasing activities as tolerated.  If you can walk 30 minutes without difficulty, it is safe to try more intense activity such as jogging, treadmill, bicycling, low-impact aerobics, swimming, etc. b. Save the most intensive and strenuous activity for last such as sit-ups, heavy lifting, contact sports, etc  Refrain from any heavy lifting or straining until you are off narcotics for pain control.  c. DO NOT PUSH THROUGH PAIN.  Let pain be your guide: If it hurts to do something, don't do it.  Pain is your body warning you to avoid that activity for another week until the pain goes down. d. You may drive when you are no longer taking prescription pain medication, you can comfortably wear a seatbelt, and you can safely maneuver your car and apply brakes. e. Dennis Bast may have sexual intercourse  when it is comfortable.  8. FOLLOW UP in our office a. Please call [facility] at [phone] to set up an appointment to see your surgeon in the office for a follow-up appointment approximately 2-3 weeks after your surgery. b. Make sure that you call for this appointment the day you arrive home to insure a convenient appointment time. 9.  IF YOU HAVE DISABILITY OR FAMILY LEAVE FORMS, BRING THEM TO THE OFFICE FOR PROCESSING.  DO NOT GIVE THEM TO YOUR DOCTOR.  WHEN TO CALL us [phone]: 1. Poor pain control 2. Reactions / problems with new medications (rash/itching, nausea, etc)  3. Fever over 101.5 F (38.5 C) 4. Inability to urinate 5. Nausea and/or vomiting 6. Worsening swelling or bruising 7. Continued bleeding from incision. 8. Increased pain, redness, or drainage from the incision   The clinic staff is available to answer your questions during regular business hours (8:30am-5pm).  Please dont hesitate to call and ask to speak to one of our nurses for clinical concerns.   If you have a medical emergency, go to the nearest emergency room or call 911.  A surgeon from [facility] is always on call at the hospitals in [facility], [place], [place], [place], [place]  [address] ?  [address] [address], [place], [place]   [address] MAIN: [phone] ? TOLL FREE: [phone] ? FAX: [phone] [email or web address]  Managing Pain  Pain after surgery or related to activity is often due to strain/injury to muscle, tendon, nerves and/or incisions.  This pain is usually short-term and will improve in a few months.   Many people find it helpful to do the following things TOGETHER to help speed the process of healing and to get back to regular activity more quickly:  1. Avoid heavy physical activity at first a. No lifting greater than 20 pounds at first, then increase to lifting as tolerated over the next few weeks b. Do not push  through the pain.  Listen to your body and avoid positions and maneuvers than reproduce the pain.  Wait a few days before trying something more intense c. Walking is okay as tolerated, but go slowly and stop when getting sore.  If you can walk 30 minutes without stopping or pain, you can try more intense activity (running, jogging, aerobics, cycling, swimming, treadmill, sex, sports, weightlifting, etc ) d. Remember: If it hurts to do it, then dont do it!  2. Take Anti-inflammatory medication a. Choose ONE of the following over-the-counter medications: i.            Acetaminophen 556m tabs (Tylenol) 1-2 pills with every meal and just before bedtime (avoid if you have liver problems) ii.            Naproxen 2220mtabs (ex. Aleve) 1-2 pills twice a day (avoid if you have kidney, stomach, IBD, or bleeding problems) iii. Ibuprofen 20038mabs (ex. Advil, Motrin) 3-4 pills with every meal and just before bedtime (avoid if you have kidney, stomach, IBD, or bleeding  problems) b. Take with food/snack around the clock for 1-2 weeks i. This helps the muscle and nerve tissues become less irritable and calm down faster  3. Use a Heating pad or Ice/Cold Pack a. 4-6 times a day b. May use warm bath/hottub  or showers  4. Try Gentle Massage and/or Stretching  a. at the area of pain many times a day b. stop if you feel pain - do not overdo it  Try these steps together to help you body heal faster and avoid making things get worse.  Doing just one of these things may not be enough.    If you are not getting better after two weeks or are noticing you are getting worse, contact our office for further advice; we may need to re-evaluate you & see what other things we can do to help.  Hernia, Adult A hernia is the bulging of an organ or tissue through a weak spot in the muscles of the abdomen (abdominal wall). Hernias develop most often near the navel or groin. There are many kinds of hernias. Common kinds  include:  Femoral hernia. This kind of hernia develops under the groin in the upper thigh area.  Inguinal hernia. This kind of hernia develops in the groin or scrotum.  Umbilical hernia. This kind of hernia develops near the navel.  Hiatal hernia. This kind of hernia causes part of the stomach to be pushed up into the chest.  Incisional hernia. This kind of hernia bulges through a scar from an abdominal surgery. CAUSES This condition may be caused by:  Heavy lifting.  Coughing over a long period of time.  Straining to have a bowel movement.  An incision made during an abdominal surgery.  A birth defect (congenital defect).  Excess weight or obesity.  Smoking.  Poor nutrition.  Cystic fibrosis.  Excess fluid in the abdomen.  Undescended testicles. SYMPTOMS Symptoms of a hernia include:  A lump on the abdomen. This is the first sign of a hernia. The lump may become more obvious with standing, straining, or coughing. It may get bigger over time if it is not treated or if the condition causing it is not treated.  Pain. A hernia is usually painless, but it may become painful over time if treatment is delayed. The pain is usually dull and may get worse with standing or lifting heavy objects. Sometimes a hernia gets tightly squeezed in the weak spot (strangulated) or stuck there (incarcerated) and causes additional symptoms. These symptoms may include:  Vomiting.  Nausea.  Constipation.  Irritability. DIAGNOSIS A hernia may be diagnosed with:  A physical exam. During the exam your health care provider may ask you to cough or to make a specific movement, because a hernia is usually more visible when you move.  Imaging tests. These can include:  X-rays.  Ultrasound.  CT scan. TREATMENT A hernia that is small and painless may not need to be treated. A hernia that is large or painful may be treated with surgery. Inguinal hernias may be treated with surgery to  prevent incarceration or strangulation. Strangulated hernias are always treated with surgery, because lack of blood to the trapped organ or tissue can cause it to die. Surgery to treat a hernia involves pushing the bulge back into place and repairing the weak part of the abdomen. HOME CARE INSTRUCTIONS  Avoid straining.  Do not lift anything heavier than 10 lb (4.5 kg).  Lift with your leg muscles, not your back muscles.  This helps avoid strain.  When coughing, try to cough gently.  Prevent constipation. Constipation leads to straining with bowel movements, which can make a hernia worse or cause a hernia repair to break down. You can prevent constipation by:  Eating a high-fiber diet that includes plenty of fruits and vegetables.  Drinking enough fluids to keep your urine clear or pale yellow. Aim to drink 6-8 glasses of water per day.  Using a stool softener as directed by your health care provider.  Lose weight, if you are overweight.  Do not use any tobacco products, including cigarettes, chewing tobacco, or electronic cigarettes. If you need help quitting, ask your health care provider.  Keep all follow-up visits as directed by your health care provider. This is important. Your health care provider may need to monitor your condition. SEEK MEDICAL CARE IF:  You have swelling, redness, and pain in the affected area.  Your bowel habits change. SEEK IMMEDIATE MEDICAL CARE IF:  You have a fever.  You have abdominal pain that is getting worse.  You feel nauseous or you vomit.  You cannot push the hernia back in place by gently pressing on it while you are lying down.  The hernia:  Changes in shape or size.  Is stuck outside the abdomen.  Becomes discolored.  Feels hard or tender.   This information is not intended to replace advice given to you by your health care provider. Make sure you discuss any questions you have with your health care provider.   Document  Released: 04/21/2005 Document Revised: 05/12/2014 Document Reviewed: 03/01/2014 Elsevier Interactive Patient Education 2016 [place]. Irregular bowel habits such as constipation and diarrhea can lead to many problems over time.  Having one soft bowel movement a day is the most important way to prevent further problems.  The anorectal canal is designed to handle stretching and feces to safely manage our ability to get rid of solid waste (feces, poop, stool) out of our body.  BUT, hard constipated stools can act like ripping concrete bricks and diarrhea can be a burning fire to this very sensitive area of our body, causing inflamed hemorrhoids, anal fissures, increasing risk is perirectal abscesses, abdominal pain/bloating, an making irritable bowel worse.      The goal: ONE SOFT BOWEL MOVEMENT A DAY!  To have soft, regular bowel movements:   Drink plenty of fluids, consider 4-6 tall glasses of water a day.    Take plenty of fiber.  Fiber is the undigested part of plant food that passes into the colon, acting s natures broom to encourage bowel motility and movement.  Fiber can absorb and hold large amounts of water. This results in a larger, bulkier stool, which is soft and easier to pass. Work gradually over several weeks up to 6 servings a day of fiber (25g a day even more if needed) in the form of: o Vegetables -- Root (potatoes, carrots, turnips), leafy green (lettuce, salad greens, celery, spinach), or cooked high residue (cabbage, broccoli, etc) o Fruit -- Fresh (unpeeled skin & pulp), Dried (prunes, apricots, cherries, etc ),  or stewed ( applesauce)  o Whole grain breads, pasta, etc (whole wheat)  o Bran cereals   Bulking Agents -- This type of water-retaining fiber generally is easily obtained each day by one of the following:  o Psyllium bran -- The psyllium plant is remarkable because its ground seeds can retain so much water. This product is available  as Metamucil, Konsyl, Effersyllium, Per Diem Fiber, or the less expensive generic preparation in drug and health food stores. Although labeled a laxative, it really is not a laxative.  o Methylcellulose -- This is another fiber derived from wood which also retains water. It is available as Citrucel. o Polyethylene Glycol - and artificial fiber commonly called Miralax or Glycolax.  It is helpful for people with gassy or bloated feelings with regular fiber o Flax Seed - a less gassy fiber than psyllium  No reading or other relaxing activity while on the toilet. If bowel movements take longer than 5 minutes, you are too constipated  AVOID CONSTIPATION.  High fiber and water intake usually takes care of this.  Sometimes a laxative is needed to stimulate more frequent bowel movements, but   Laxatives are not a good long-term solution as it can wear the colon out.  They can help jump-start bowels if constipated, but should be relied on constantly without discussing with your doctor o Osmotics (Milk of Magnesia, Fleets phosphosoda, Magnesium citrate, MiraLax, GoLytely) are safer than  o Stimulants (Senokot, Castor Oil, Dulcolax, Ex Lax)    o Avoid taking laxatives for more than 7 days in a row.   IF SEVERELY CONSTIPATED, try a Bowel Retraining Program: o Do not use laxatives.  o Eat a diet high in roughage, such as bran cereals and leafy vegetables.  o Drink six (6) ounces of prune or apricot juice each morning.  o Eat two (2) large servings of stewed fruit each day.  o Take one (1) heaping tablespoon of a psyllium-based bulking agent twice a day. Use sugar-free sweetener when possible to avoid excessive calories.  o Eat a normal breakfast.  o Set aside 15 minutes after breakfast to sit on the toilet, but do not strain to have a bowel movement.  o If you do not have a bowel movement by the third day, use an enema and repeat the above steps.   Controlling diarrhea o Switch to liquids and simpler  foods for a few days to avoid stressing your intestines further. o Avoid dairy products (especially milk & ice cream) for a short time.  The intestines often can lose the ability to digest lactose when stressed. o Avoid foods that cause gassiness or bloating.  Typical foods include beans and other legumes, cabbage, broccoli, and dairy foods.  Every person has some sensitivity to other foods, so listen to our body and avoid those foods that trigger problems for you. o Adding fiber (Citrucel, Metamucil, psyllium, Miralax) gradually can help thicken stools by absorbing excess fluid and retrain the intestines to act more normally.  Slowly increase the dose over a few weeks.  Too much fiber too soon can backfire and cause cramping & bloating. o Probiotics (such as active yogurt, Align, etc) may help repopulate the intestines and colon with normal bacteria and calm down a sensitive digestive tract.  Most studies show it to be of mild help, though, and such products can be costly. o Medicines: - Bismuth subsalicylate (ex. Kayopectate, Pepto Bismol) every 30 minutes for up to 6 doses can help control diarrhea.  Avoid if pregnant. - Loperamide (Immodium) can slow down diarrhea.  Start with two tablets (58m total) first and then try one tablet every 6 hours.  Avoid if you are having fevers or severe pain.  If you are not better or start feeling worse, stop all medicines and call your doctor for advice o Call your doctor if you  are getting worse or not better.  Sometimes further testing (cultures, endoscopy, X-ray studies, bloodwork, etc) may be needed to help diagnose and treat the cause of the diarrhea.  TROUBLESHOOTING IRREGULAR BOWELS 1) Avoid extremes of bowel movements (no bad constipation/diarrhea) 2) Miralax 17gm mixed in 8oz. water or juice-daily. May use BID as needed.  3) Gas-x,Phazyme, etc. as needed for gas & bloating.  4) Soft,bland diet. No spicy,greasy,fried foods.  5) Prilosec over-the-counter  as needed  6) May hold gluten/wheat products from diet to see if symptoms improve.  7)  May try probiotics (Align, Activa, etc) to help calm the bowels down 7) If symptoms become worse call back immediately.  Exercising to Stay Healthy Exercising regularly is important. It has many health benefits, such as:  Improving your overall fitness, flexibility, and endurance.  Increasing your bone density.  Helping with weight control.  Decreasing your body fat.  Increasing your muscle strength.  Reducing stress and tension.  Improving your overall health. In order to become healthy and stay healthy, it is recommended that you do moderate-intensity and vigorous-intensity exercise. You can tell that you are exercising at a moderate intensity if you have a higher heart rate and faster breathing, but you are still able to hold a conversation. You can tell that you are exercising at a vigorous intensity if you are breathing much harder and faster and cannot hold a conversation while exercising. HOW OFTEN SHOULD I EXERCISE? Choose an activity that you enjoy and set realistic goals. Your health care provider can help you to make an activity plan that works for you. Exercise regularly as directed by your health care provider. This may include:   Doing resistance training twice each week, such as:  Push-ups.  Sit-ups.  Lifting weights.  Using resistance bands.  Doing a given intensity of exercise for a given amount of time. Choose from these options:  150 minutes of moderate-intensity exercise every week.  75 minutes of vigorous-intensity exercise every week.  A mix of moderate-intensity and vigorous-intensity exercise every week. Children, pregnant women, people who are out of shape, people who are overweight, and older adults may need to consult a health care provider for individual recommendations. If you have any sort of medical condition, be sure to consult your health care provider  before starting a new exercise program.  WHAT ARE SOME EXERCISE IDEAS? Some moderate-intensity exercise ideas include:   Walking at a rate of 1 mile in 15 minutes.  Biking.  Hiking.  Golfing.  Dancing. Some vigorous-intensity exercise ideas include:   Walking at a rate of at least 4.5 miles per hour.  Jogging or running at a rate of 5 miles per hour.  Biking at a rate of at least 10 miles per hour.  Lap swimming.  Roller-skating or in-line skating.  Cross-country skiing.  Vigorous competitive sports, such as football, basketball, and soccer.  Jumping rope.  Aerobic dancing. WHAT ARE SOME EVERYDAY ACTIVITIES THAT CAN HELP ME TO GET EXERCISE?  Yard work, such as:  Psychologist, educational.  Raking and bagging leaves.  Washing and waxing your car.  Pushing a stroller.  Shoveling snow.  Gardening.  Washing windows or floors. HOW CAN I BE MORE ACTIVE IN MY DAY-TO-DAY ACTIVITIES?  Use the stairs instead of the elevator.  Take a walk during your lunch break.  If you drive, park your car farther away from work or school.  If you take public transportation, get off one stop early and walk  the rest of the way.  Make all of your phone calls while standing up and walking around.  Get up, stretch, and walk around every 30 minutes throughout the day. WHAT GUIDELINES SHOULD I FOLLOW WHILE EXERCISING?  Do not exercise so much that you hurt yourself, feel dizzy, or get very short of breath.  Consult your health care provider before starting a new exercise program.  Wear comfortable clothes and shoes with good support.  Drink plenty of water while you exercise to prevent dehydration or heat stroke. Body water is lost during exercise and must be replaced.  Work out until you breathe faster and your heart beats faster.   This information is not intended to replace advice given to you by your health care provider. Make sure you discuss any questions you have with your  health care provider.   Document Released: 05/24/2010 Document Revised: 05/12/2014 Document Reviewed: 09/22/2013 Elsevier Interactive Patient Education [employer].

## 2015-09-13 NOTE — Op Note (Addendum)
09/13/2015  1:27 PM  PATIENT:  Kathryn Cline  40 y.o. female  Patient Care Team: Donella Stade, PA-C as PCP - General (Family Medicine) Michael Boston, MD as Consulting Physician (General Surgery) [name], MD as Consulting Physician (Family Medicine) [name], PA-C as Physician Assistant (Psychiatry)  PRE-OPERATIVE DIAGNOSIS:  Hernia in abdomen  POST-OPERATIVE DIAGNOSIS:  INCISIONAL HERNIA INCARCERATED   PROCEDURE:  Laparoscopic reduction and repair of incarcerated incisional ventral hernia INSERTION OF MESH  SURGEON:  Surgeon(s): Michael Boston, MD  ANESTHESIA:     General Local anesthesia field block: (0.25% bupivacaine & liposomal  Bupivacaine [Experel])   EBL:  Total I/O In: 2000 [I.V.:2000] Out: 150 [Urine:50; Blood:100]  Delay start of Pharmacological VTE agent (>24hrs) due to surgical blood loss or risk of bleeding:  no  DRAINS: none   SPECIMEN:  No Specimen  DISPOSITION OF SPECIMEN:  N/A  COUNTS:  YES  PLAN OF CARE: Discharge to home after PACU  PATIENT DISPOSITION:  PACU - hemodynamically stable.  INDICATION: Pleasant patient has developed a ventral wall abdominal hernia.   Recommendation was made for surgical repair:  The anatomy & physiology of the abdominal wall was discussed. The pathophysiology of hernias was discussed. Natural history risks without surgery including progeressive enlargement, pain, incarceration & strangulation was discussed. Contributors to complications such as smoking, obesity, diabetes, prior surgery, etc were discussed.  I feel the risks of no intervention will lead to serious problems that outweigh the operative risks; therefore, I recommended surgery to reduce and repair the hernia. I explained laparoscopic techniques with possible need for an open approach. I noted the probable use of mesh to patch and/or buttress the hernia repair  Risks such as bleeding, infection, abscess, need for further treatment,  heart attack, death, and other risks were discussed. I noted a good likelihood this will help address the problem. Goals of post-operative recovery were discussed as well. Possibility that this will not correct all symptoms was explained. I stressed the importance of low-impact activity, aggressive pain control, avoiding constipation, & not pushing through pain to minimize risk of post-operative chronic pain or injury. Possibility of reherniation especially with smoking, obesity, diabetes, immunosuppression, and other health conditions was discussed. We will work to minimize complications.  An educational handout further explaining the pathology & treatment options was given as well. Questions were answered. The patient expresses understanding & wishes to proceed with surgery.   OR FINDINGS: 45x16m slightly infraumbilical incisional hernia incarcerated with omentum.   Type of repair - Laparoscopic underlay repair   Name of mesh -  Bard Ventralight dual sided (polypropylene / Seprafilm)  Size of mesh - Height 20 cm, Width 25 cm  Orientation:  Transverse   Mesh overlap - 7 cm  Placement of mesh - Intraperitoneal underlay repair   DESCRIPTION:   Informed consent was confirmed. The patient underwent general anaesthesia without difficulty. The patient was positioned appropriately. VTE prevention in place. The patient's abdomen was clipped, prepped, & draped in a sterile fashion. Surgical timeout confirmed our plan.  The patient was positioned in reverse Trendelenburg. Abdominal entry was gained using optical entry technique in the left upper abdomen. Entry was clean. I induced carbon dioxide insufflation. Camera inspection revealed no injury. Extra ports were carefully placed under direct laparoscopic visualization.   I freed out adhesions of greater omentum to the anterior bowel wall.  Mainly periumbilical and infraumbilical.  I did laparoscopic lysis adhesions to free adhesions off the entire  abdominal wall.  reduced greater omentum out of an obvious hernia in the central abdomen.   I made sure hemostasis was good.  I mapped out the region using a needle passer.   To ensure that I would have at least 5 cm radial coverage outside of the hernia defect, I chose a 25x20 cm dual sided mesh.  I placed #1 Prolene stitches around its edge about every 5 cm = 14 total.  I rolled the mesh & placed into the peritoneal cavity through the 10 cm fascial defect.  I unrolled  the mesh and positioned it appropriately.  I secured the mesh to cover up the hernia defect using a laparoscopic suture passer to pass the tails of the Prolene through the abdominal wall & tagged them with clamps.  I started out in four corners to make sure I had the mesh centered under the hernia defect appropriately, and then proceeded to work in quadrants.  We evacuated CO2 & desufflated the abdomen.  I tied the fascial stitches down.  I reinsufflated the abdomen.  The mesh provided at least 5-10 cm circumferential coverage around the entire region of hernia defects.    A good field block of local anesthesia was used at fascial stitch sites & fascial closure areas.  There was a little bit of oozing in the right lower quadrant and left upper quadrant.  I did figure-of-eight PDS sutures around it for good hemostasis.  After that, hemostasis was excellent.  I freed the umbilical stalk off of the hernia sac and fascia.  I excised moderate volume of fat and hernia sac out to get healthier fascia.  I closed the hernia using #1 PDS interrupted transverse stitches primarily.  I did reinspection. Hemostasis was good. Mesh laid well. I also placed #1 Prolene in the upper and lower paramedian regions on the right and left side 4 more central stitches to help secure the mesh given her morbid obesity.  I tacked the edges & central part of the mesh to the peritoneum/posterior rectus fascia with SecureStrap absorbable tacks.  Capnoperitoneum was evacuated.  Ports were removed. The skin was closed with Monocryl at the port sites and Steri-Strips on the fascial stitch puncture sites.  A right lower quadrant puncture site had some subcutaneous bleeding.  I opened it up and provided better hemostasis.  Closed that with 4-0 Monocryl suture.  Sterile dressings applied.  Patient is extubated and now in the PACU recovery room.  I had discussed postoperative recommendations with the patient in the holding area.  She was interested in trying to go home today as long as she was comfortable.  I discussed intraoperative findings, treatment, and postoperative instructions with the patient's husband and daughter.  The patient would like to try and go home.  We will see if that will succeed.  Instructions are written.  Questions answered.  Expressed understanding and appreciation.  [name], M.D., F.A.C.S. Gastrointestinal and Minimally Invasive Surgery [facility] [address] [place], [place] [address] [phone] Main / Paging

## 2015-09-13 NOTE — H&P (View-Only) (Signed)
Kathryn Cline 08/21/2015 11:29 AM Location: [facility] Patient #: [id number] DOB: 15-Jul-1975 Married / Language: English / Race: White Female  History of Present Illness Kathryn Cline; 08/21/2015 12:14 PM) The patient is a 40 year old female who presents for an evaluation of a who presents for an evaluation of a hernia.   Patient Care Team: Donella Stade, PA-C as PCP - General (Family Medicine) Michael Boston, Cline as Consulting Physician (General Surgery) [name], Cline as Consulting Physician (Family Medicine) [name], PA-C as Physician Assistant (Psychiatry)  Additional reasons for visit:  Incisional hernia is described as the following: Note for "Incisional hernia": Patient sent by Kathryn Cline, physician assistant, over concerns of painful hernia and abdomen. Request for surgical consultation.  Pleasant morbidly obese woman. Comes today with her daughter. Has numerous health issues including morbid obesity and fibromyalgia. Neuralgias. PTSD. Has had a few abdominal surgeries in her life. Had ovarian cysts removed and his teenager. Had a hysterectomy and appendectomy in the past. Had a laparoscopic cholecystectomy about 10 years ago [place] through [facility]. Sounds like she had large stones. Since noted a lump just below her belly button in the past few months. Causes pain and discomfort. He's been intentionally trying to lose weight and exercise more. Unfortunately she's had been worsening hip and joint pain that is getting therapy for that. Activity level has gone down. Was even needing a walker for a while but seems to be getting better. She quit smoking last year. She is worked hard to not go back on it. Moves her bowels about 3 or 4 times a day. No severe constipation or diarrhea. Because of the worsening abdominal discomfort, concern for hernia. She did have pains last year. Had a hernia noted then in February 2016. Incarcerated with  omentum only. She wishes to consider surgery if she does have a hernia as it is causing her discomfort.   Other Problems Kathryn Cline, CMA; 08/21/2015 11:29 AM) Arthritis Asthma Back Pain Depression Gastroesophageal Reflux Disease Hypercholesterolemia Migraine Headache  Past Surgical History Kathryn Cline, CMA; 08/21/2015 11:29 AM) Cesarean Section - 1 Gallbladder Surgery - Open Hysterectomy (not due to cancer) - Complete  Diagnostic Studies History Kathryn Cline, CMA; 08/21/2015 11:29 AM) Colonoscopy >10 years ago Mammogram within last year  Allergies Kathryn Cline, CMA; 08/21/2015 11:33 AM) OxyCODONE HCl *ANALGESICS - OPIOID* Itching. Vicodin *ANALGESICS - OPIOID* Nausea, Vomiting, Itching. Viibryd *ANTIDEPRESSANTS* Shortness of breath, Swelling. Amitriptyline HCl *CHEMICALS* Rash. Brintellix *ANTIDEPRESSANTS* Nausea, Vomiting. Codeine Sulfate *ANALGESICS - OPIOID* Lyrica *ANTICONVULSANTS* Swelling. Azithromycin *CHEMICALS* Gabapentin *CHEMICALS* Chlordiazepoxide-Amitriptyline *PSYCHOTHERAPEUTIC AND NEUROLOGICAL AGENTS - MISC.* Nausea. Effexor *ANTIDEPRESSANTS*  Medication History (Alisha Cline, CMA; 08/21/2015 11:41 AM) Cyclobenzaprine HCl (10MG Tablet, Oral) Active. DULoxetine HCl (60MG Capsule DR Part, Oral) Active. Furosemide (40MG Tablet, Oral) Active. LORazepam (0.5MG Tablet, Oral) Active. Belviq (10MG Tablet, Oral) Active. Montelukast Sodium (10MG Tablet, Oral) Active. Topiramate (50MG Tablet, Oral) Active. TraZODone HCl (50MG Tablet, Oral) Active. ProAir HFA (108 (90 Base)MCG/ACT Aerosol Soln, Inhalation) Active. Astelin (137MCG/SPRAY Solution, Nasal) Active. ZyrTEC Allergy (10MG Tablet, Oral) Active. Vitamin D3 (5000UNIT Tablet, Oral) Active. Voltaren (75MG Tablet DR, Oral) Active. Voltaren (1% Gel, Transdermal) Active. Benadryl (25MG Tablet, Oral) Active. Relpax (20MG Tablet, Oral) Active. Flonase  (50MCG/ACT Suspension, Nasal) Active. DuoNeb (0.5-2.5 (3)MG/3ML Solution, Inhalation) Active. Patanase (0.6% Solution, Nasal) Active. Protonix (40MG Tablet DR, Oral) Active. PredniSONE (10MG Tablet, Oral) Active. Phenergan (25MG Tablet, Oral) Active. Medications Reconciled  Social History Illene Cline, CMA; 08/21/2015 11:29 AM)  Caffeine use Coffee, Tea. No alcohol use No drug use Tobacco use Former smoker.  Family History Illene Cline, CMA; 08/21/2015 11:29 AM) Depression Brother, Mother. Heart Disease Brother, Father, Mother. Heart disease in female family member before age 66 Heart disease in female family member before age 97 Prostate Cancer Father. Respiratory Condition Brother.  Pregnancy / Birth History Illene Cline, CMA; 08/21/2015 11:29 AM) Age at menarche 50 years. Gravida 5 Maternal age 93-25 Para 1     Review of Systems Kathryn Cline CMA; 08/21/2015 11:29 AM) General Not Present- Appetite Loss, Chills, Fatigue, Fever, Night Sweats, Weight Gain and Weight Loss. Skin Not Present- Change in Wart/Mole, Dryness, Hives, Jaundice, New Lesions, Non-Healing Wounds, Rash and Ulcer. HEENT Present- Seasonal Allergies and Wears glasses/contact lenses. Not Present- Earache, Hearing Loss, Hoarseness, Nose Bleed, Oral Ulcers, Ringing in the Ears, Sinus Pain, Sore Throat, Visual Disturbances and Yellow Eyes. Respiratory Not Present- Bloody sputum, Chronic Cough, Difficulty Breathing, Snoring and Wheezing. Breast Not Present- Breast Mass, Breast Pain, Nipple Discharge and Skin Changes. Cardiovascular Not Present- Chest Pain, Difficulty Breathing Lying Down, Leg Cramps, Palpitations, Rapid Heart Rate, Shortness of Breath and Swelling of Extremities. Gastrointestinal Not Present- Abdominal Pain, Bloating, Bloody Stool, Change in Bowel Habits, Chronic diarrhea, Constipation, Difficulty Swallowing, Excessive gas, Gets full quickly at meals, Hemorrhoids,  Indigestion, Nausea, Rectal Pain and Vomiting. Female Genitourinary Not Present- Frequency, Nocturia, Painful Urination, Pelvic Pain and Urgency. Musculoskeletal Present- Back Pain and Joint Pain. Not Present- Joint Stiffness, Muscle Pain, Muscle Weakness and Swelling of Extremities. Neurological Present- Headaches. Not Present- Decreased Memory, Fainting, Numbness, Seizures, Tingling, Tremor, Trouble walking and Weakness. Psychiatric Present- Depression. Not Present- Anxiety, Bipolar, Change in Sleep Pattern, Fearful and Frequent crying. Endocrine Present- Hot flashes. Not Present- Cold Intolerance, Excessive Hunger, Hair Changes, Heat Intolerance and New Diabetes. Hematology Not Present- Easy Bruising, Excessive bleeding, Gland problems, HIV and Persistent Infections.  Vitals (Alisha Cline CMA; 08/21/2015 11:30 AM) 08/21/2015 11:29 AM Weight: 276 lb Height: 69in Body Surface Area: 2.37 m Body Mass Index: 40.76 kg/m  Pulse: 90 (Regular)  BP: 134/78 (Sitting, Left Arm, Standard)      Physical Exam Kathryn Cline; 08/21/2015 12:11 PM)  Abdomen Note: Review obese but soft. Moderate panniculus. 6 x 5 cm mass felt just inferior to the umbilicus. Sensitive. Not reducible. Diastases. No abdominal pain or discomfort. Panniculus clean with good hygiene. No rash or breakdown.  Female Genitourinary Note: Good hygiene under panniculus. No inguinal hernias.  Neuropsychiatric Note: Somewhat reserved/guarded with responses. Did seem to open up at the end.    Assessment & Plan Kathryn Cline; 08/21/2015 12:15 PM)  Mickel Duhamel HERNIA (K43.0) Impression: Periumbilical/infraumbilical lump with tenderness and pain correlating with obvious hernia on CAT scan from last year. Larger on exam than expected on CT scan.  I think she would benefit from repair given the fact has gotten larger and causing pain and discomfort. Risk of recurrences rather high in the setting  of morbid obesity & panniculus, but she is at least not smoking anymore. We'll try and lower the chance of recurrence using a larger sheet of mesh.  With her fibromyalgia and chronic pain and other issues, expected recovery will take several months until the soreness goes down. She is at risk for chronic pain. However we'll try and minimize the chance of having problems. Aggressive field block with liposomal bupivacaine. Binder. Tylenol around-the-clock. Try and find a narcotic her pain medication that she can actually tolerate. Hopefully she can tolerate  at least tramadol since she has issues with most other narcotics and a large myriad of other medications.  ENCOUNTER FOR PRE-OPERATIVE EXAMINATION - [place] (I71.245)  Current Plans You are being scheduled for surgery - Our schedulers will call you.  You should hear from our office's scheduling department within 5 working days about the location, date, and time of surgery. We try to make accommodations for patient's preferences in scheduling surgery, but sometimes the OR schedule or the surgeon's schedule prevents [place] from making those accommodations.  If you have not heard from our office [phone]) in 5 working days, call the office and ask for your surgeon's nurse.  If you have other questions about your diagnosis, plan, or surgery, call the office and ask for your surgeon's nurse.  Written instructions provided The anatomy & physiology of the abdominal wall was discussed. The pathophysiology of hernias was discussed. Natural history risks without surgery including progeressive enlargement, pain, incarceration, & strangulation was discussed. Contributors to complications such as smoking, obesity, diabetes, prior surgery, etc were discussed.  I feel the risks of no intervention will lead to serious problems that outweigh the operative risks; therefore, I recommended surgery to reduce and repair the hernia. I explained laparoscopic techniques with  possible need for an open approach. I noted the probable use of mesh to patch and/or buttress the hernia repair  Risks such as bleeding, infection, abscess, need for further treatment, heart attack, death, and other risks were discussed. I noted a good likelihood this will help address the problem. Goals of post-operative recovery were discussed as well. Possibility that this will not correct all symptoms was explained. I stressed the importance of low-impact activity, aggressive pain control, avoiding constipation, & not pushing through pain to minimize risk of post-operative chronic pain or injury. Possibility of reherniation especially with smoking, obesity, diabetes, immunosuppression, and other health conditions was discussed. We will work to minimize complications.  An educational handout further explaining the pathology & treatment options was given as well. Questions were answered. The patient expresses understanding & wishes to proceed with surgery.  Pt Education - Pamphlet Given - Laparoscopic Hernia Repair: discussed with patient and provided information. Pt Education - [facility] Hernia Post-Op HCI ([place]): discussed with patient and provided information. Pt Education - [facility] Pain Control ([place])

## 2015-09-14 ENCOUNTER — Encounter (HOSPITAL_COMMUNITY): Payer: Self-pay | Admitting: Surgery

## 2015-09-16 ENCOUNTER — Telehealth: Payer: Self-pay | Admitting: Surgery

## 2015-09-16 NOTE — Telephone Encounter (Signed)
Ms. Sermons had a lap ventral hernia on 09/13/2015 by Dr.Gross.  She has not had a BM since surgery.  She has tried an enema.  I encouraged to be as active as she can be, drink plenty of liquids, she could try to take MOM, and limit the narcotics, as best she can.  She'll call our office if no better in a couple of days.  Alphonsa Overall, MD, [facility] Pager: [phone] Office phone:  [phone]

## 2015-10-04 DIAGNOSIS — G2581 Restless legs syndrome: Secondary | ICD-10-CM | POA: Diagnosis not present

## 2015-10-04 DIAGNOSIS — R200 Anesthesia of skin: Secondary | ICD-10-CM | POA: Diagnosis not present

## 2015-10-04 DIAGNOSIS — R2681 Unsteadiness on feet: Secondary | ICD-10-CM | POA: Diagnosis not present

## 2015-10-04 DIAGNOSIS — G603 Idiopathic progressive neuropathy: Secondary | ICD-10-CM | POA: Diagnosis not present

## 2015-10-08 ENCOUNTER — Ambulatory Visit: Payer: Self-pay | Admitting: Sports Medicine

## 2015-10-18 ENCOUNTER — Ambulatory Visit (HOSPITAL_COMMUNITY): Payer: Self-pay | Admitting: Medical

## 2015-10-22 ENCOUNTER — Telehealth (HOSPITAL_COMMUNITY): Payer: Self-pay

## 2015-10-22 ENCOUNTER — Other Ambulatory Visit: Payer: Self-pay | Admitting: *Deleted

## 2015-10-22 MED ORDER — PANTOPRAZOLE SODIUM 40 MG PO TBEC: 40.0000 mg | DELAYED_RELEASE_TABLET | Freq: Two times a day (BID) | ORAL | Status: DC

## 2015-10-22 MED ORDER — TOPIRAMATE 50 MG PO TABS: 100.0000 mg | ORAL_TABLET | Freq: Two times a day (BID) | ORAL | Status: DC

## 2015-10-22 NOTE — Telephone Encounter (Signed)
Fax received from patients pharmacy for a refill on her duloxetine. Patient was a no show for her appt on 6/15 and does not have another scheduled. Patient was last seen on 07/12/2015. Please review and advise, thank you

## 2015-10-22 NOTE — Telephone Encounter (Signed)
Called the patients pharmacy and let them know.

## 2015-10-22 NOTE — Telephone Encounter (Signed)
Needs Appt

## 2015-11-07 DIAGNOSIS — K760 Fatty (change of) liver, not elsewhere classified: Secondary | ICD-10-CM | POA: Diagnosis not present

## 2015-11-07 DIAGNOSIS — Z888 Allergy status to other drugs, medicaments and biological substances status: Secondary | ICD-10-CM | POA: Diagnosis not present

## 2015-11-07 DIAGNOSIS — R162 Hepatomegaly with splenomegaly, not elsewhere classified: Secondary | ICD-10-CM | POA: Diagnosis not present

## 2015-11-07 DIAGNOSIS — Z883 Allergy status to other anti-infective agents status: Secondary | ICD-10-CM | POA: Diagnosis not present

## 2015-11-07 DIAGNOSIS — Z791 Long term (current) use of non-steroidal anti-inflammatories (NSAID): Secondary | ICD-10-CM | POA: Diagnosis not present

## 2015-11-07 DIAGNOSIS — J45909 Unspecified asthma, uncomplicated: Secondary | ICD-10-CM | POA: Diagnosis not present

## 2015-11-07 DIAGNOSIS — M1990 Unspecified osteoarthritis, unspecified site: Secondary | ICD-10-CM | POA: Diagnosis not present

## 2015-11-07 DIAGNOSIS — R112 Nausea with vomiting, unspecified: Secondary | ICD-10-CM | POA: Diagnosis not present

## 2015-11-07 DIAGNOSIS — F329 Major depressive disorder, single episode, unspecified: Secondary | ICD-10-CM | POA: Diagnosis not present

## 2015-11-07 DIAGNOSIS — Z885 Allergy status to narcotic agent status: Secondary | ICD-10-CM | POA: Diagnosis not present

## 2015-11-07 DIAGNOSIS — R109 Unspecified abdominal pain: Secondary | ICD-10-CM | POA: Diagnosis not present

## 2015-11-07 DIAGNOSIS — R300 Dysuria: Secondary | ICD-10-CM | POA: Diagnosis not present

## 2015-11-07 DIAGNOSIS — R1032 Left lower quadrant pain: Secondary | ICD-10-CM | POA: Diagnosis not present

## 2015-11-07 DIAGNOSIS — E876 Hypokalemia: Secondary | ICD-10-CM | POA: Diagnosis not present

## 2015-11-07 DIAGNOSIS — I10 Essential (primary) hypertension: Secondary | ICD-10-CM | POA: Diagnosis not present

## 2015-11-07 DIAGNOSIS — G629 Polyneuropathy, unspecified: Secondary | ICD-10-CM | POA: Diagnosis not present

## 2015-11-07 DIAGNOSIS — M797 Fibromyalgia: Secondary | ICD-10-CM | POA: Diagnosis not present

## 2015-11-07 DIAGNOSIS — Z87891 Personal history of nicotine dependence: Secondary | ICD-10-CM | POA: Diagnosis not present

## 2015-11-08 ENCOUNTER — Ambulatory Visit (INDEPENDENT_AMBULATORY_CARE_PROVIDER_SITE_OTHER): Payer: BLUE CROSS/BLUE SHIELD | Admitting: Osteopathic Medicine

## 2015-11-08 DIAGNOSIS — Z5329 Procedure and treatment not carried out because of patient's decision for other reasons: Secondary | ICD-10-CM

## 2015-11-08 NOTE — Progress Notes (Signed)
No-show 11/08/2015 for acute visit with Dr. Sheppard Coil This is patient's second no show on record

## 2015-11-09 ENCOUNTER — Ambulatory Visit (INDEPENDENT_AMBULATORY_CARE_PROVIDER_SITE_OTHER): Payer: BLUE CROSS/BLUE SHIELD | Admitting: Physician Assistant

## 2015-11-09 ENCOUNTER — Encounter: Payer: Self-pay | Admitting: Physician Assistant

## 2015-11-09 VITALS — BP 143/83 | HR 86 | Temp 98.3°F | Ht 69.0 in | Wt 270.0 lb

## 2015-11-09 DIAGNOSIS — N3001 Acute cystitis with hematuria: Secondary | ICD-10-CM

## 2015-11-09 DIAGNOSIS — K2970 Gastritis, unspecified, without bleeding: Secondary | ICD-10-CM

## 2015-11-09 DIAGNOSIS — R300 Dysuria: Secondary | ICD-10-CM

## 2015-11-09 DIAGNOSIS — E876 Hypokalemia: Secondary | ICD-10-CM | POA: Diagnosis not present

## 2015-11-09 DIAGNOSIS — K2990 Gastroduodenitis, unspecified, without bleeding: Secondary | ICD-10-CM

## 2015-11-09 LAB — POCT URINALYSIS DIPSTICK
GLUCOSE UA: NEGATIVE
Ketones, UA: NEGATIVE
NITRITE UA: NEGATIVE
PH UA: 5.5
PROTEIN UA: NEGATIVE
Spec Grav, UA: 1.025
UROBILINOGEN UA: 0.2

## 2015-11-09 MED ORDER — TRAMADOL HCL 50 MG PO TABS: 50.0000 mg | ORAL_TABLET | Freq: Three times a day (TID) | ORAL | Status: DC | PRN

## 2015-11-09 MED ORDER — FLUCONAZOLE 200 MG PO TABS: 200.0000 mg | ORAL_TABLET | Freq: Every day | ORAL | Status: AC

## 2015-11-09 MED ORDER — CIPROFLOXACIN HCL 500 MG PO TABS: 500.0000 mg | ORAL_TABLET | Freq: Two times a day (BID) | ORAL | Status: DC

## 2015-11-09 MED ORDER — DEXLANSOPRAZOLE 60 MG PO CPDR: 60.0000 mg | DELAYED_RELEASE_CAPSULE | Freq: Every day | ORAL | Status: DC

## 2015-11-09 NOTE — Patient Instructions (Signed)
dexilant to replace protonix.  Stop NSAIDs celebrex and ibuprofen.  Zantac as needed.  Tramadol for pain.

## 2015-11-09 NOTE — Progress Notes (Signed)
Subjective:    Patient ID: Kathryn Cline, female    DOB: [birth year], 40 y.o.   MRN: [id number]  HPI  Pt is a 40 yo female who presents to the clinic to follow up after ED visit 11/07/2015. She went in with left sided abdominal pain.  Labs:  AGAP 14  CBC AND DIFFERENTIAL - Normal  WBC 9.8  RBC 4.38  HGB 12.7  HCT 38.8  MCV 89  MCH 29.0  MCHC 32.7  Plt Ct 234  RDW SD 43.6  MPV 11.1  NEUTROPHIL % 57.9  LYMPHOCYTE % 33.6  MONOCYTE % 6.5  Eosinophil % 1.4  BASOPHIL % 0.6  ABSOLUTE NEUTROPHIL COUNT 5.69  ABSOLUTE LYMPHOCYTE COUNT 3.3  MONO ABSOLUTE 0.6  EOS ABSOLUTE 0.1  BASO ABSOLUTE 0.1  LIPASE - Normal  Lipase 29  TROPONIN T - Normal  Trop T <0.010   141 136 - 146 mmol/L   Potassium 3.2 (L) 3.7 - 5.4 mmol/L  Cl 101 97 - 108 mmol/L  CO2 26 20 - 32 mmol/L  Glucose 103 (H) 65 - 99 mg/dL  BUN 9 6 - 24 mg/dL  Creatinine 0.62 0.57 - 1.00 mg/dL  Ca 9.1 8.7 - 10.2 mg/dL  ALK PHOS 60 25 - 150 IU/L  T Bili 0.42 0.00 - 1.20 mg/dL  Total Protein 7.1 6.0 - 8.5 gm/dL  Alb 4.4 3.5 - 5.5 gm/dL  GLOBULIN 2.7 1.5 - 4.5 gm/dL  ALBUMIN/GLOBULIN RATIO 1.6 1.1 - 2.5  BUN/CREAT RATIO 14.5 11.0 - 26.0  ALT 25 0 - 40 IU/L  AST 22    Potassium was low.   CT of abdomen normal.  EKG normal.  Urine trace leuks and bilirubin.  Potassium 11mq given bid and pt discharged with follow up here.   Symptoms started Monday. Left sided abdomen pain. She is nauseated. No fever or chills. No melena or hematochezia. She does have frequent loose stools. She has occasional chills. Urination increased and painful. Had hernia surgery on 10/14/15 and has had abdominal discomfort since then. Post-op appt was normal. She did start then taking ibuprofen with celebrex. Admits to GERD being uncontrolled.         Review of Systems  All other systems reviewed and are negative.      Objective:   Physical Exam  Constitutional: She is oriented to person, place, and time. She appears well-developed  and well-nourished.  HENT:  Head: Normocephalic and atraumatic.  Cardiovascular: Normal rate, regular rhythm and normal heart sounds.   Pulmonary/Chest: Effort normal and breath sounds normal.  No CVA tenderness.   Abdominal: Soft. Bowel sounds are normal.  Moderate tenderness over LUQ pain and LLQ. No guarding or rebound.   Neurological: She is alert and oriented to person, place, and time.  Psychiatric: She has a normal mood and affect. Her behavior is normal.          Assessment & Plan:  Left upper and lower quadrant pain- could certainly be having some residual surgery inflammation and pain. i suspect some gastritis due to increase of NSAIDs. Stop all NSAIDs. Switched protonix to [facility] Zantac bid for next 5 days.  Tramadol for pain as needed.   .. Results for orders placed or performed in visit on 11/09/15  Urine Culture  Result Value Ref Range   Preliminary Report Culture reincubated for better growth   POCT urinalysis dipstick  Result Value Ref Range   Color, UA yellow    Clarity, UA cloudy  Glucose, UA neg    Bilirubin, UA small    Ketones, UA neg    Spec Grav, UA 1.025    Blood, UA trace-lysed    pH, UA 5.5    Protein, UA neg    Urobilinogen, UA 0.2    Nitrite, UA neg    Leukocytes, UA Trace (A) Negative    Acute cystitis with hematuria- hx of dipstick normal and has infection. Will culture. Will start treatment with cipro. dilfucan for abx yeast infection.   Hypokalemia- recheck with BMP in one week.

## 2015-11-12 LAB — URINE CULTURE

## 2015-11-15 DIAGNOSIS — Z885 Allergy status to narcotic agent status: Secondary | ICD-10-CM | POA: Diagnosis not present

## 2015-11-15 DIAGNOSIS — G8929 Other chronic pain: Secondary | ICD-10-CM | POA: Diagnosis not present

## 2015-11-15 DIAGNOSIS — M47817 Spondylosis without myelopathy or radiculopathy, lumbosacral region: Secondary | ICD-10-CM | POA: Insufficient documentation

## 2015-11-15 DIAGNOSIS — J45909 Unspecified asthma, uncomplicated: Secondary | ICD-10-CM | POA: Diagnosis not present

## 2015-11-15 DIAGNOSIS — M545 Low back pain: Secondary | ICD-10-CM | POA: Diagnosis not present

## 2015-11-15 DIAGNOSIS — M47816 Spondylosis without myelopathy or radiculopathy, lumbar region: Secondary | ICD-10-CM | POA: Diagnosis not present

## 2015-11-15 DIAGNOSIS — I10 Essential (primary) hypertension: Secondary | ICD-10-CM | POA: Diagnosis not present

## 2015-11-15 DIAGNOSIS — G894 Chronic pain syndrome: Secondary | ICD-10-CM | POA: Diagnosis not present

## 2015-11-20 ENCOUNTER — Ambulatory Visit (INDEPENDENT_AMBULATORY_CARE_PROVIDER_SITE_OTHER): Payer: BLUE CROSS/BLUE SHIELD | Admitting: Family Medicine

## 2015-11-20 ENCOUNTER — Encounter: Payer: Self-pay | Admitting: Family Medicine

## 2015-11-20 VITALS — BP 125/78 | HR 83 | Temp 98.3°F | Wt 273.0 lb

## 2015-11-20 DIAGNOSIS — K430 Incisional hernia with obstruction, without gangrene: Secondary | ICD-10-CM | POA: Diagnosis not present

## 2015-11-20 DIAGNOSIS — G894 Chronic pain syndrome: Secondary | ICD-10-CM

## 2015-11-20 DIAGNOSIS — R300 Dysuria: Secondary | ICD-10-CM | POA: Diagnosis not present

## 2015-11-20 LAB — CBC
HEMATOCRIT: 39.4 % (ref 35.0–45.0)
Hemoglobin: 13 g/dL (ref 11.7–15.5)
MCH: 28.8 pg (ref 27.0–33.0)
MCHC: 33 g/dL (ref 32.0–36.0)
MCV: 87.2 fL (ref 80.0–100.0)
MPV: 10.2 fL (ref 7.5–12.5)
Platelets: 233 10*3/uL (ref 140–400)
RBC: 4.52 MIL/uL (ref 3.80–5.10)
RDW: 13.6 % (ref 11.0–15.0)
WBC: 9 10*3/uL (ref 3.8–10.8)

## 2015-11-20 LAB — COMPREHENSIVE METABOLIC PANEL
ALK PHOS: 52 U/L (ref 33–115)
ALT: 23 U/L (ref 6–29)
AST: 26 U/L (ref 10–30)
Albumin: 4.2 g/dL (ref 3.6–5.1)
BUN: 7 mg/dL (ref 7–25)
CALCIUM: 9.2 mg/dL (ref 8.6–10.2)
CO2: 27 mmol/L (ref 20–31)
Chloride: 101 mmol/L (ref 98–110)
Creat: 0.72 mg/dL (ref 0.50–1.10)
Glucose, Bld: 86 mg/dL (ref 65–99)
POTASSIUM: 3.9 mmol/L (ref 3.5–5.3)
Sodium: 139 mmol/L (ref 135–146)
TOTAL PROTEIN: 6.9 g/dL (ref 6.1–8.1)
Total Bilirubin: 0.5 mg/dL (ref 0.2–1.2)

## 2015-11-20 LAB — LIPASE: LIPASE: 27 U/L (ref 7–60)

## 2015-11-20 MED ORDER — CEFDINIR 300 MG PO CAPS: 300.0000 mg | ORAL_CAPSULE | Freq: Two times a day (BID) | ORAL | Status: DC

## 2015-11-20 NOTE — Patient Instructions (Signed)
Thank you for coming in today. Take omnicef twice daily for 1 week.  We will get labs today.  Repeat urine culture.  Follow up with your Surgeon's office.  It is OK to see someone else if your surgeon is not available.
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
## 2015-11-21 NOTE — Progress Notes (Signed)
Quick Note:  Labs look ok. Follow up with General Surgery. Pelvic PT may help the abdominal pain. ______

## 2015-11-22 ENCOUNTER — Encounter (HOSPITAL_COMMUNITY): Payer: Self-pay | Admitting: Medical

## 2015-11-22 ENCOUNTER — Ambulatory Visit (INDEPENDENT_AMBULATORY_CARE_PROVIDER_SITE_OTHER): Payer: BLUE CROSS/BLUE SHIELD | Admitting: Medical

## 2015-11-22 ENCOUNTER — Telehealth (HOSPITAL_COMMUNITY): Payer: Self-pay | Admitting: *Deleted

## 2015-11-22 VITALS — BP 130/84 | HR 92 | Ht 69.0 in | Wt 275.0 lb

## 2015-11-22 DIAGNOSIS — F4312 Post-traumatic stress disorder, chronic: Secondary | ICD-10-CM | POA: Diagnosis not present

## 2015-11-22 DIAGNOSIS — M5136 Other intervertebral disc degeneration, lumbar region: Secondary | ICD-10-CM

## 2015-11-22 DIAGNOSIS — G894 Chronic pain syndrome: Secondary | ICD-10-CM

## 2015-11-22 DIAGNOSIS — F331 Major depressive disorder, recurrent, moderate: Secondary | ICD-10-CM | POA: Diagnosis not present

## 2015-11-22 DIAGNOSIS — F17200 Nicotine dependence, unspecified, uncomplicated: Secondary | ICD-10-CM

## 2015-11-22 DIAGNOSIS — Z62810 Personal history of physical and sexual abuse in childhood: Secondary | ICD-10-CM

## 2015-11-22 DIAGNOSIS — G4700 Insomnia, unspecified: Secondary | ICD-10-CM

## 2015-11-22 DIAGNOSIS — M797 Fibromyalgia: Secondary | ICD-10-CM

## 2015-11-22 DIAGNOSIS — T7422XS Child sexual abuse, confirmed, sequela: Secondary | ICD-10-CM

## 2015-11-22 LAB — URINE CULTURE: ORGANISM ID, BACTERIA: NO GROWTH

## 2015-11-22 MED ORDER — TRAZODONE HCL 50 MG PO TABS: 50.0000 mg | ORAL_TABLET | Freq: Every evening | ORAL | Status: DC | PRN

## 2015-11-22 MED ORDER — DULOXETINE HCL 60 MG PO CPEP: 60.0000 mg | ORAL_CAPSULE | Freq: Every day | ORAL | Status: DC

## 2015-11-22 NOTE — Telephone Encounter (Signed)
Pt made an appt to be seen today. Nothing further needed at this time
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
## 2015-11-22 NOTE — Telephone Encounter (Signed)
Fax received from [facility] for a refill on her duloxetine. Patient was a no show for her appt on 6/15 and does not have another scheduled. Patient was last seen on 07/12/2015. Patient's pharmacy was notified on 10/22/15, pt will need an appt before a refill could be issued. Lvm for pt to return call to office to schedule appt.

## 2015-11-25 ENCOUNTER — Emergency Department (HOSPITAL_BASED_OUTPATIENT_CLINIC_OR_DEPARTMENT_OTHER)
Admission: EM | Admit: 2015-11-25 | Discharge: 2015-11-25 | Disposition: A | Discharge status: Home / Self Care | Payer: BLUE CROSS/BLUE SHIELD | Attending: Emergency Medicine | Admitting: Emergency Medicine

## 2015-11-25 ENCOUNTER — Encounter (HOSPITAL_BASED_OUTPATIENT_CLINIC_OR_DEPARTMENT_OTHER): Payer: Self-pay | Admitting: *Deleted

## 2015-11-25 DIAGNOSIS — M1990 Unspecified osteoarthritis, unspecified site: Secondary | ICD-10-CM | POA: Diagnosis not present

## 2015-11-25 DIAGNOSIS — R300 Dysuria: Secondary | ICD-10-CM | POA: Insufficient documentation

## 2015-11-25 DIAGNOSIS — Z87891 Personal history of nicotine dependence: Secondary | ICD-10-CM | POA: Insufficient documentation

## 2015-11-25 DIAGNOSIS — F329 Major depressive disorder, single episode, unspecified: Secondary | ICD-10-CM | POA: Diagnosis not present

## 2015-11-25 DIAGNOSIS — J45909 Unspecified asthma, uncomplicated: Secondary | ICD-10-CM | POA: Diagnosis not present

## 2015-11-25 DIAGNOSIS — I10 Essential (primary) hypertension: Secondary | ICD-10-CM | POA: Insufficient documentation

## 2015-11-25 DIAGNOSIS — R1012 Left upper quadrant pain: Secondary | ICD-10-CM | POA: Diagnosis not present

## 2015-11-25 LAB — COMPREHENSIVE METABOLIC PANEL
ALBUMIN: 4.2 g/dL (ref 3.5–5.0)
ALK PHOS: 56 U/L (ref 38–126)
ALT: 26 U/L (ref 14–54)
ANION GAP: 8 (ref 5–15)
AST: 27 U/L (ref 15–41)
BUN: 8 mg/dL (ref 6–20)
CALCIUM: 9.1 mg/dL (ref 8.9–10.3)
CO2: 29 mmol/L (ref 22–32)
CREATININE: 0.58 mg/dL (ref 0.44–1.00)
Chloride: 103 mmol/L (ref 101–111)
GFR calc Af Amer: >60 mL/min (ref >60)
GFR calc non Af Amer: >60 mL/min (ref >60)
Glucose, Bld: 88 mg/dL (ref 65–99)
POTASSIUM: 3.9 mmol/L (ref 3.5–5.1)
SODIUM: 140 mmol/L (ref 135–145)
TOTAL PROTEIN: 7.1 g/dL (ref 6.5–8.1)
Total Bilirubin: 0.6 mg/dL (ref 0.3–1.2)

## 2015-11-25 LAB — URINALYSIS, ROUTINE W REFLEX MICROSCOPIC
BILIRUBIN URINE: NEGATIVE
Glucose, UA: NEGATIVE mg/dL
Hgb urine dipstick: NEGATIVE
KETONES UR: NEGATIVE mg/dL
Leukocytes, UA: NEGATIVE
NITRITE: NEGATIVE
Protein, ur: NEGATIVE mg/dL
SPECIFIC GRAVITY, URINE: 1.012 (ref 1.005–1.030)
pH: 6 (ref 5.0–8.0)

## 2015-11-25 LAB — CBC WITH DIFFERENTIAL/PLATELET
BASOS PCT: 0 %
Basophils Absolute: 0 10*3/uL (ref 0.0–0.1)
EOS PCT: 2 %
Eosinophils Absolute: 0.2 10*3/uL (ref 0.0–0.7)
HCT: 38.9 % (ref 36.0–46.0)
HEMOGLOBIN: 12.6 g/dL (ref 12.0–15.0)
Lymphocytes Relative: 30 %
Lymphs Abs: 2.7 10*3/uL (ref 0.7–4.0)
MCH: 28.9 pg (ref 26.0–34.0)
MCHC: 32.4 g/dL (ref 30.0–36.0)
MCV: 89.2 fL (ref 78.0–100.0)
Monocytes Absolute: 0.7 10*3/uL (ref 0.1–1.0)
Monocytes Relative: 7 %
NEUTROS PCT: 61 %
Neutro Abs: 5.5 10*3/uL (ref 1.7–7.7)
Platelets: 212 10*3/uL (ref 150–400)
RBC: 4.36 MIL/uL (ref 3.87–5.11)
RDW: 13.5 % (ref 11.5–15.5)
WBC: 9 10*3/uL (ref 4.0–10.5)

## 2015-11-25 LAB — LIPASE, BLOOD: LIPASE: 21 U/L (ref 11–51)

## 2015-11-25 MED ORDER — PROMETHAZINE HCL 25 MG PO TABS: 25.0000 mg | ORAL_TABLET | ORAL | Status: DC

## 2015-11-25 MED ORDER — METOCLOPRAMIDE HCL 5 MG/ML IJ SOLN
10.0000 mg | Freq: Once | INTRAMUSCULAR | Status: AC
  Administered 2015-11-25: 10 mg via INTRAVENOUS

## 2015-11-25 MED ORDER — METOCLOPRAMIDE HCL 5 MG/ML IJ SOLN
INTRAMUSCULAR | Status: AC
  Filled 2015-11-25: qty 2

## 2015-11-25 MED ORDER — HYDROCODONE-ACETAMINOPHEN 5-325 MG PO TABS
2.0000 | ORAL_TABLET | Freq: Once | ORAL | Status: AC
  Administered 2015-11-25: 2 via ORAL
  Filled 2015-11-25: qty 2

## 2015-11-25 NOTE — ED Notes (Signed)
Pt given d/c instructions as per chart. Verbalizes understanding. No questions.

## 2015-11-25 NOTE — ED Triage Notes (Signed)
Pt reports abdominal pain x several weeks.  States that she has a UTI that she is taking antibiotics for.  Pt tearful in triage.
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
## 2015-11-25 NOTE — ED Notes (Addendum)
Pt Rx'd 30 day supply of Norco by MD at [facility] [place] [facility] on 11/15/15.  Pt has received multiple ref[facility]co [facility].

## 2015-11-25 NOTE — ED Notes (Signed)
Pt reports abdominal pain for past month+.  Has seen multiple doctors since, most recently Dr Johney Maine on Thursday and was told her pain was likely residual from a hernia surgery she'd had 2 months ago.  Pt states pain with palpation to ULQ

## 2015-11-25 NOTE — ED Notes (Signed)
Asked patient to clarify several medications on her allergy list.  Pt states "Percocet gives me a rash and codeine stirs up my asthma."  Asked pt about her history with taking vicodin.  Pt states, "As long is it doesn't have codeine in it, that's okay.  Usually they just give me dilaudid because that's safer and I know I won't have a reaction to it." Patient unable to recall any adverse reaction to taking vicodin in the past.  Dr Rex Kras notified.

## 2015-11-26 ENCOUNTER — Telehealth: Payer: Self-pay | Admitting: *Deleted

## 2015-11-26 NOTE — Telephone Encounter (Signed)
PA initiated through covermymeds

## 2015-11-27 LAB — URINE CULTURE: Culture: 40000 — AB

## 2015-11-28 ENCOUNTER — Other Ambulatory Visit: Payer: Self-pay | Admitting: Surgery

## 2015-12-04 ENCOUNTER — Other Ambulatory Visit: Payer: Self-pay | Admitting: Physician Assistant

## 2015-12-04 DIAGNOSIS — Z1231 Encounter for screening mammogram for malignant neoplasm of breast: Secondary | ICD-10-CM

## 2015-12-04 DIAGNOSIS — M47816 Spondylosis without myelopathy or radiculopathy, lumbar region: Secondary | ICD-10-CM | POA: Diagnosis not present

## 2015-12-05 ENCOUNTER — Ambulatory Visit (INDEPENDENT_AMBULATORY_CARE_PROVIDER_SITE_OTHER): Payer: BLUE CROSS/BLUE SHIELD

## 2015-12-05 DIAGNOSIS — Z1231 Encounter for screening mammogram for malignant neoplasm of breast: Secondary | ICD-10-CM

## 2015-12-19 NOTE — Telephone Encounter (Signed)
Approvedon July 24  Effective from 11/26/2015 through 05/04/2038. Pt aware

## 2016-01-10 ENCOUNTER — Encounter: Payer: Self-pay | Admitting: Sports Medicine

## 2016-01-10 ENCOUNTER — Ambulatory Visit (INDEPENDENT_AMBULATORY_CARE_PROVIDER_SITE_OTHER): Payer: BLUE CROSS/BLUE SHIELD | Admitting: Sports Medicine

## 2016-01-10 DIAGNOSIS — M160 Bilateral primary osteoarthritis of hip: Secondary | ICD-10-CM | POA: Diagnosis not present

## 2016-01-10 NOTE — Progress Notes (Signed)
  Subjective:    CC: Bilateral hip pain  HPI: This is a pleasant 40 year old female with known bilateral hip osteoarthritis, previous injection was 5 months ago with good relief. Now desire repeat injections, pain is moderate, persistent, localized in the groin bilaterally. Worse with weightbearing.  Past medical history, Surgical history, Family history not pertinant except as noted below, Social history, Allergies, and medications have been entered into the medical record, reviewed, and no changes needed.   Review of Systems: No fevers, chills, night sweats, weight loss, chest pain, or shortness of breath.   Objective:    General: Well Developed, well nourished, and in no acute distress.  Neuro: Alert and oriented x3, extra-ocular muscles intact, sensation grossly intact.  HEENT: Normocephalic, atraumatic, pupils equal round reactive to light, neck supple, no masses, no lymphadenopathy, thyroid nonpalpable.  Skin: Warm and dry, no rashes. Cardiac: Regular rate and rhythm, no murmurs rubs or gallops, no lower extremity edema.  Respiratory: Clear to auscultation bilaterally. Not using accessory muscles, speaking in full sentences.  Procedure: Real-time Ultrasound Guided Injection of left hip joint Device: GE Logiq E  Verbal informed consent obtained.  Time-out conducted.  Noted no overlying erythema, induration, or other signs of local infection.  Skin prepped in a sterile fashion.  Local anesthesia: Topical Ethyl chloride.  With sterile technique and under real time ultrasound guidance:  Spinal needle advanced to the head neck junction, 1 mL Kenalog 40, 2 mL lidocaine, 2 mL Marcaine injected easily. Completed without difficulty  Pain immediately resolved suggesting accurate placement of the medication.  Advised to call if fevers/chills, erythema, induration, drainage, or persistent bleeding.  Images permanently stored and available for review in the ultrasound unit.  Impression:  Technically successful ultrasound guided injection.  Procedure: Real-time Ultrasound Guided Injection of right hip joint Device: GE Logiq E  Verbal informed consent obtained.  Time-out conducted.  Noted no overlying erythema, induration, or other signs of local infection.  Skin prepped in a sterile fashion.  Local anesthesia: Topical Ethyl chloride.  With sterile technique and under real time ultrasound guidance:  Spinal needle advanced to the head neck junction, 1 mL Kenalog 40, 2 mL lidocaine, 2 mL Marcaine injected easily. Completed without difficulty  Pain immediately resolved suggesting accurate placement of the medication.  Advised to call if fevers/chills, erythema, induration, drainage, or persistent bleeding.  Images permanently stored and available for review in the ultrasound unit.  Impression: Technically successful ultrasound guided injection.   Impression and Recommendations:    Osteoarthritis of both hips 5 month response to hip injection, repeat bilateral femoroacetabular joint injection as above, return as needed.

## 2016-01-10 NOTE — Assessment & Plan Note (Signed)
5 month response to hip injection, repeat bilateral femoroacetabular joint injection as above, return as needed.

## 2016-01-15 ENCOUNTER — Ambulatory Visit (INDEPENDENT_AMBULATORY_CARE_PROVIDER_SITE_OTHER): Payer: BLUE CROSS/BLUE SHIELD | Admitting: Sports Medicine

## 2016-01-15 ENCOUNTER — Ambulatory Visit (INDEPENDENT_AMBULATORY_CARE_PROVIDER_SITE_OTHER): Payer: BLUE CROSS/BLUE SHIELD

## 2016-01-15 DIAGNOSIS — S99922A Unspecified injury of left foot, initial encounter: Secondary | ICD-10-CM | POA: Diagnosis not present

## 2016-01-15 DIAGNOSIS — S92919A Unspecified fracture of unspecified toe(s), initial encounter for closed fracture: Secondary | ICD-10-CM | POA: Insufficient documentation

## 2016-01-15 DIAGNOSIS — M79675 Pain in left toe(s): Secondary | ICD-10-CM

## 2016-01-15 MED ORDER — TRAMADOL HCL 50 MG PO TABS: 50.0000 mg | ORAL_TABLET | Freq: Three times a day (TID) | ORAL | 0 refills | Status: DC | PRN

## 2016-01-15 NOTE — Assessment & Plan Note (Signed)
Question fracture at the proximal interphalangeal joint. Toes #2 and 3 were buddy taped together, x-rays, postop shoe. Short course of tramadol for pain. We are avoiding scheduled II controlled substances, she does have a pain doctor.

## 2016-01-15 NOTE — Progress Notes (Signed)
   Subjective:    I'm seeing this patient as a consultation for:  [name], PA-C  CC: Left foot injury  HPI: This pleasant 40 year old female fell, she injured her left second toe with pain and swelling immediately at the proximal interphalangeal joint, mild, persistent without radiation.  Past medical history, Surgical history, Family history not pertinant except as noted below, Social history, Allergies, and medications have been entered into the medical record, reviewed, and no changes needed.   Review of Systems: No headache, visual changes, nausea, vomiting, diarrhea, constipation, dizziness, abdominal pain, skin rash, fevers, chills, night sweats, weight loss, swollen lymph nodes, body aches, joint swelling, muscle aches, chest pain, shortness of breath, mood changes, visual or auditory hallucinations.   Objective:   General: Well Developed, well nourished, and in no acute distress.  Neuro/Psych: Alert and oriented x3, extra-ocular muscles intact, able to move all 4 extremities, sensation grossly intact. Skin: Warm and dry, no rashes noted.  Respiratory: Not using accessory muscles, speaking in full sentences, trachea midline.  Cardiovascular: Pulses palpable, no extremity edema. Abdomen: Does not appear distended. Left Foot: Visible swelling with pain at the left second proximal interphalangeal joint. Range of motion is full in all directions. Strength is 5/5 in all directions. No hallux valgus. No pes cavus or pes planus. No abnormal callus noted. No pain over the navicular prominence, or base of fifth metatarsal. No tenderness to palpation of the calcaneal insertion of plantar fascia. No pain at the Achilles insertion. No pain over the calcaneal bursa. No pain of the retrocalcaneal bursa. No tenderness to palpation over the tarsals, metatarsals, or phalanges. No hallux rigidus or limitus. No tenderness palpation over interphalangeal joints. No pain with compression  of the metatarsal heads. Neurovascularly intact distally.  Second and third toes were buddy taped together.  Impression and Recommendations:   This case required medical decision making of moderate complexity.  Injury of left 2nd toe Question fracture at the proximal interphalangeal joint. Toes #2 and 3 were buddy taped together, x-rays, postop shoe. Short course of tramadol for pain. We are avoiding scheduled II controlled substances, she does have a pain doctor.

## 2016-01-28 ENCOUNTER — Other Ambulatory Visit: Payer: Self-pay | Admitting: Physician Assistant

## 2016-01-28 ENCOUNTER — Ambulatory Visit (INDEPENDENT_AMBULATORY_CARE_PROVIDER_SITE_OTHER): Payer: BLUE CROSS/BLUE SHIELD | Admitting: Sports Medicine

## 2016-01-28 DIAGNOSIS — S99922D Unspecified injury of left foot, subsequent encounter: Secondary | ICD-10-CM | POA: Diagnosis not present

## 2016-01-28 DIAGNOSIS — G43809 Other migraine, not intractable, without status migrainosus: Secondary | ICD-10-CM | POA: Diagnosis not present

## 2016-01-28 MED ORDER — ATENOLOL 50 MG PO TABS: 50.0000 mg | ORAL_TABLET | Freq: Every day | ORAL | 3 refills | Status: DC

## 2016-01-28 NOTE — Progress Notes (Signed)
  Subjective:    CC: Follow-up  HPI: Toe fracture: Currently asymptomatic.  Migraines: Has been taking rizatriptan every day, also takes Topamax for prevention. She has borderline high blood pressure and is a bit tachycardic today and wonders what else we can do. Symptoms are moderate, persistent.  Past medical history:  Negative.  See flowsheet/record as well for more information.  Surgical history: Negative.  See flowsheet/record as well for more information.  Family history: Negative.  See flowsheet/record as well for more information.  Social history: Negative.  See flowsheet/record as well for more information.  Allergies, and medications have been entered into the medical record, reviewed, and no changes needed.   Review of Systems: No fevers, chills, night sweats, weight loss, chest pain, or shortness of breath.   Objective:    General: Well Developed, well nourished, and in no acute distress.  Neuro: Alert and oriented x3, extra-ocular muscles intact, sensation grossly intact.  HEENT: Normocephalic, atraumatic, pupils equal round reactive to light, neck supple, no masses, no lymphadenopathy, thyroid nonpalpable.  Skin: Warm and dry, no rashes. Cardiac: Regular rate and rhythm, no murmurs rubs or gallops, no lower extremity edema.  Respiratory: Clear to auscultation bilaterally. Not using accessory muscles, speaking in full sentences.  Impression and Recommendations:    Migraines Borderline high blood pressure, some tachycardia. With persistent and recurrent migraines to the point where she has to take her abortant medication daily, we are going to add atenolol 50 mg. Return to see Jade into weeks to check blood pressure, pulse, and migraines.  Injury of left 2nd toe Spiral fracture through the left second proximal phalanx, no longer tender to palpation. Continue postop shoe for another week and then may proceed with normal footwear.  I spent 25 minutes with this patient,  greater than 50% was face-to-face time counseling regarding the above diagnoses

## 2016-01-28 NOTE — Assessment & Plan Note (Signed)
Spiral fracture through the left second proximal phalanx, no longer tender to palpation. Continue postop shoe for another week and then may proceed with normal footwear.

## 2016-01-28 NOTE — Assessment & Plan Note (Signed)
Borderline high blood pressure, some tachycardia. With persistent and recurrent migraines to the point where she has to take her abortant medication daily, we are going to add atenolol 50 mg. Return to see Kathryn Cline into weeks to check blood pressure, pulse, and migraines.

## 2016-01-31 ENCOUNTER — Telehealth (HOSPITAL_COMMUNITY): Payer: Self-pay | Admitting: Medical

## 2016-01-31 ENCOUNTER — Emergency Department (HOSPITAL_COMMUNITY)
Admission: EM | Admit: 2016-01-31 | Discharge: 2016-01-31 | Disposition: A | Discharge status: Home / Self Care | Payer: BLUE CROSS/BLUE SHIELD | Attending: Emergency Medicine | Admitting: Emergency Medicine

## 2016-01-31 ENCOUNTER — Encounter (HOSPITAL_COMMUNITY): Payer: Self-pay

## 2016-01-31 DIAGNOSIS — F329 Major depressive disorder, single episode, unspecified: Secondary | ICD-10-CM | POA: Diagnosis not present

## 2016-01-31 DIAGNOSIS — Z87891 Personal history of nicotine dependence: Secondary | ICD-10-CM | POA: Insufficient documentation

## 2016-01-31 DIAGNOSIS — F32A Depression, unspecified: Secondary | ICD-10-CM

## 2016-01-31 DIAGNOSIS — I10 Essential (primary) hypertension: Secondary | ICD-10-CM | POA: Diagnosis not present

## 2016-01-31 DIAGNOSIS — Z79899 Other long term (current) drug therapy: Secondary | ICD-10-CM | POA: Insufficient documentation

## 2016-01-31 DIAGNOSIS — J45909 Unspecified asthma, uncomplicated: Secondary | ICD-10-CM | POA: Diagnosis not present

## 2016-01-31 LAB — CBC WITH DIFFERENTIAL/PLATELET
BASOS ABS: 0.1 10*3/uL (ref 0.0–0.1)
BASOS PCT: 0 %
EOS ABS: 0.2 10*3/uL (ref 0.0–0.7)
Eosinophils Relative: 2 %
HEMATOCRIT: 44.8 % (ref 36.0–46.0)
HEMOGLOBIN: 14.7 g/dL (ref 12.0–15.0)
Lymphocytes Relative: 32 %
Lymphs Abs: 4.3 10*3/uL — ABNORMAL HIGH (ref 0.7–4.0)
MCH: 29.3 pg (ref 26.0–34.0)
MCHC: 32.8 g/dL (ref 30.0–36.0)
MCV: 89.4 fL (ref 78.0–100.0)
MONOS PCT: 4 %
Monocytes Absolute: 0.6 10*3/uL (ref 0.1–1.0)
NEUTROS ABS: 8.2 10*3/uL — AB (ref 1.7–7.7)
NEUTROS PCT: 62 %
Platelets: 270 10*3/uL (ref 150–400)
RBC: 5.01 MIL/uL (ref 3.87–5.11)
RDW: 15 % (ref 11.5–15.5)
WBC: 13.4 10*3/uL — AB (ref 4.0–10.5)

## 2016-01-31 LAB — ETHANOL

## 2016-01-31 LAB — COMPREHENSIVE METABOLIC PANEL
ALBUMIN: 4.5 g/dL (ref 3.5–5.0)
ALK PHOS: 68 U/L (ref 38–126)
ALT: 36 U/L (ref 14–54)
ANION GAP: 10 (ref 5–15)
AST: 25 U/L (ref 15–41)
BILIRUBIN TOTAL: 0.8 mg/dL (ref 0.3–1.2)
BUN: 7 mg/dL (ref 6–20)
CALCIUM: 9.6 mg/dL (ref 8.9–10.3)
CO2: 26 mmol/L (ref 22–32)
Chloride: 107 mmol/L (ref 101–111)
Creatinine, Ser: 0.79 mg/dL (ref 0.44–1.00)
GFR calc Af Amer: >60 mL/min (ref >60)
GFR calc non Af Amer: >60 mL/min (ref >60)
GLUCOSE: 97 mg/dL (ref 65–99)
Potassium: 3.4 mmol/L — ABNORMAL LOW (ref 3.5–5.1)
SODIUM: 143 mmol/L (ref 135–145)
TOTAL PROTEIN: 7.7 g/dL (ref 6.5–8.1)

## 2016-01-31 LAB — RAPID URINE DRUG SCREEN, HOSP PERFORMED
Amphetamines: NOT DETECTED
BARBITURATES: NOT DETECTED
BENZODIAZEPINES: NOT DETECTED
COCAINE: NOT DETECTED
OPIATES: NOT DETECTED
TETRAHYDROCANNABINOL: NOT DETECTED

## 2016-01-31 LAB — ACETAMINOPHEN LEVEL: Acetaminophen (Tylenol), Serum: <10 ug/mL — ABNORMAL LOW (ref 10–30)

## 2016-01-31 LAB — SALICYLATE LEVEL: Salicylate Lvl: <4 mg/dL (ref 2.8–30.0)

## 2016-01-31 MED ORDER — IBUPROFEN 200 MG PO TABS: 600.0000 mg | ORAL_TABLET | Freq: Once | ORAL | Status: DC

## 2016-01-31 NOTE — ED Notes (Signed)
Vitals not done because patient wanted to leave.

## 2016-01-31 NOTE — ED Notes (Signed)
Bed: WTR8 Expected date:  Expected time:  Means of arrival:  Comments:

## 2016-01-31 NOTE — ED Triage Notes (Addendum)
Pt c/o increasing depression and feeling "overwhelmed" x 4 months.  Denies SI, HI, and AVH.  Sts stressors include family issues.  Sts she received a letter from her brother yesterday saying that she is a "bad person" and a "nuisance" to her parents.  Pt takes Cymbalta for depression.  Pt is followed by a psychiatrist at [facility] in [place].       Hx of chronic pain r/t neuropathy and fibromyalgia.

## 2016-01-31 NOTE — Telephone Encounter (Signed)
Pt walked in clinic today with daughter, Lilia Pro. Pt states she was having thoughts of harming herself, no HI  crying due to a letter she received from brother. Pt states she wanted to increase Cymbalta. No appointments were available to be seen by provider today. Informed pt she will need to go to [facility] or ED. Pt agreed to go to ED. PT's daughter, Lilia Pro agreed to take her to ED. Pt is schedule to f/u with provider on 02/07/16.
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
## 2016-02-01 ENCOUNTER — Other Ambulatory Visit: Payer: Self-pay | Admitting: *Deleted

## 2016-02-01 MED ORDER — MONTELUKAST SODIUM 10 MG PO TABS: 10.0000 mg | ORAL_TABLET | Freq: Every day | ORAL | 0 refills | Status: DC

## 2016-02-07 ENCOUNTER — Ambulatory Visit (INDEPENDENT_AMBULATORY_CARE_PROVIDER_SITE_OTHER): Payer: BLUE CROSS/BLUE SHIELD | Admitting: Medical

## 2016-02-07 ENCOUNTER — Encounter (HOSPITAL_COMMUNITY): Payer: Self-pay | Admitting: Medical

## 2016-02-07 VITALS — BP 128/82 | HR 94 | Ht 69.0 in | Wt 268.0 lb

## 2016-02-07 DIAGNOSIS — F4312 Post-traumatic stress disorder, chronic: Secondary | ICD-10-CM | POA: Diagnosis not present

## 2016-02-07 DIAGNOSIS — Z818 Family history of other mental and behavioral disorders: Secondary | ICD-10-CM

## 2016-02-07 DIAGNOSIS — F17200 Nicotine dependence, unspecified, uncomplicated: Secondary | ICD-10-CM | POA: Diagnosis not present

## 2016-02-07 DIAGNOSIS — Z87891 Personal history of nicotine dependence: Secondary | ICD-10-CM

## 2016-02-07 DIAGNOSIS — G894 Chronic pain syndrome: Secondary | ICD-10-CM

## 2016-02-07 DIAGNOSIS — T7422XS Child sexual abuse, confirmed, sequela: Secondary | ICD-10-CM

## 2016-02-07 DIAGNOSIS — M797 Fibromyalgia: Secondary | ICD-10-CM

## 2016-02-07 DIAGNOSIS — M5136 Other intervertebral disc degeneration, lumbar region: Secondary | ICD-10-CM

## 2016-02-07 DIAGNOSIS — Z8249 Family history of ischemic heart disease and other diseases of the circulatory system: Secondary | ICD-10-CM

## 2016-02-07 DIAGNOSIS — Z823 Family history of stroke: Secondary | ICD-10-CM
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
## 2016-02-11 ENCOUNTER — Other Ambulatory Visit: Payer: Self-pay | Admitting: Physician Assistant

## 2016-02-21 ENCOUNTER — Ambulatory Visit (INDEPENDENT_AMBULATORY_CARE_PROVIDER_SITE_OTHER): Payer: BLUE CROSS/BLUE SHIELD | Admitting: Sports Medicine

## 2016-02-21 ENCOUNTER — Ambulatory Visit: Payer: Self-pay | Admitting: Family Medicine

## 2016-02-21 ENCOUNTER — Encounter: Payer: Self-pay | Admitting: Sports Medicine

## 2016-02-21 DIAGNOSIS — N3001 Acute cystitis with hematuria: Secondary | ICD-10-CM | POA: Diagnosis not present

## 2016-02-21 DIAGNOSIS — N390 Urinary tract infection, site not specified: Secondary | ICD-10-CM | POA: Insufficient documentation

## 2016-02-21 LAB — POCT URINALYSIS DIPSTICK
Bilirubin, UA: NEGATIVE
Glucose, UA: NEGATIVE
Ketones, UA: NEGATIVE
Leukocytes, UA: NEGATIVE
Nitrite, UA: NEGATIVE
Protein, UA: NEGATIVE
Spec Grav, UA: 1.015
Urobilinogen, UA: 0.2
pH, UA: 5

## 2016-02-21 MED ORDER — NITROFURANTOIN MONOHYD MACRO 100 MG PO CAPS: 100.0000 mg | ORAL_CAPSULE | Freq: Two times a day (BID) | ORAL | 0 refills | Status: DC

## 2016-02-21 MED ORDER — FLUTICASONE PROPIONATE 50 MCG/ACT NA SUSP: NASAL | 3 refills | Status: DC

## 2016-02-21 MED ORDER — PHENAZOPYRIDINE HCL 200 MG PO TABS: 200.0000 mg | ORAL_TABLET | Freq: Three times a day (TID) | ORAL | 0 refills | Status: AC

## 2016-02-21 MED ORDER — FLUCONAZOLE 150 MG PO TABS: 150.0000 mg | ORAL_TABLET | Freq: Once | ORAL | 0 refills | Status: AC

## 2016-02-21 NOTE — Progress Notes (Signed)
  Subjective:    CC: Dysuria  HPI: This is a pleasant 40 year old female, she has pain and burning with urination for the past few days, mild headache, no constitutional symptoms. She feels like this is similar to her previous urinary tract infections. Symptoms are moderate, persistent. No flank pain, no frank hematuria.  Past medical history:  Negative.  See flowsheet/record as well for more information.  Surgical history: Negative.  See flowsheet/record as well for more information.  Family history: Negative.  See flowsheet/record as well for more information.  Social history: Negative.  See flowsheet/record as well for more information.  Allergies, and medications have been entered into the medical record, reviewed, and no changes needed.   Review of Systems: No fevers, chills, night sweats, weight loss, chest pain, or shortness of breath.   Objective:    General: Well Developed, well nourished, and in no acute distress.  Neuro: Alert and oriented x3, extra-ocular muscles intact, sensation grossly intact.  HEENT: Normocephalic, atraumatic, pupils equal round reactive to light, neck supple, no masses, no lymphadenopathy, thyroid nonpalpable.  Skin: Warm and dry, no rashes. Cardiac: Regular rate and rhythm, no murmurs rubs or gallops, no lower extremity edema.  Respiratory: Clear to auscultation bilaterally. Not using accessory muscles, speaking in full sentences. Abdomen: Soft, nontender, nondistended, normal bowel sounds, palpable masses, no guarding, rigidity, rebound tenderness.  Urinalysis is positive for blood.  Impression and Recommendations:    Infection of urinary tract Macrobid. Also has some right maxillary sinusitis, and Flonase. Pyridium. Urine culture. She will need to follow up with her PCP next week to ensure clearance of the hematuria.  I spent 25 minutes with this patient, greater than 50% was face-to-face time counseling regarding the above diagnoses

## 2016-02-21 NOTE — Assessment & Plan Note (Signed)
Macrobid. Also has some right maxillary sinusitis, and Flonase. Pyridium. Urine culture. She will need to follow up with her PCP next week to ensure clearance of the hematuria.

## 2016-02-23 LAB — URINE CULTURE: Organism ID, Bacteria: NO GROWTH

## 2016-02-25 ENCOUNTER — Ambulatory Visit (INDEPENDENT_AMBULATORY_CARE_PROVIDER_SITE_OTHER): Payer: BLUE CROSS/BLUE SHIELD | Admitting: Physician Assistant

## 2016-02-25 ENCOUNTER — Ambulatory Visit (HOSPITAL_BASED_OUTPATIENT_CLINIC_OR_DEPARTMENT_OTHER)
Admission: RE | Admit: 2016-02-25 | Discharge: 2016-02-25 | Disposition: A | Discharge status: Home / Self Care | Payer: BLUE CROSS/BLUE SHIELD | Source: Ambulatory Visit | Attending: Physician Assistant | Admitting: Physician Assistant

## 2016-02-25 ENCOUNTER — Emergency Department (HOSPITAL_COMMUNITY)
Admission: EM | Admit: 2016-02-25 | Discharge: 2016-02-26 | Disposition: A | Discharge status: Home / Self Care | Payer: BLUE CROSS/BLUE SHIELD | Attending: Emergency Medicine | Admitting: Emergency Medicine

## 2016-02-25 ENCOUNTER — Encounter (HOSPITAL_COMMUNITY): Payer: Self-pay | Admitting: Emergency Medicine

## 2016-02-25 ENCOUNTER — Encounter: Payer: Self-pay | Admitting: Physician Assistant

## 2016-02-25 VITALS — BP 122/78 | HR 92 | Ht 69.0 in | Wt 264.0 lb

## 2016-02-25 DIAGNOSIS — R300 Dysuria: Secondary | ICD-10-CM

## 2016-02-25 DIAGNOSIS — N898 Other specified noninflammatory disorders of vagina: Secondary | ICD-10-CM

## 2016-02-25 DIAGNOSIS — R112 Nausea with vomiting, unspecified: Secondary | ICD-10-CM

## 2016-02-25 DIAGNOSIS — E876 Hypokalemia: Secondary | ICD-10-CM

## 2016-02-25 DIAGNOSIS — R339 Retention of urine, unspecified: Secondary | ICD-10-CM | POA: Insufficient documentation

## 2016-02-25 DIAGNOSIS — J45909 Unspecified asthma, uncomplicated: Secondary | ICD-10-CM | POA: Diagnosis not present

## 2016-02-25 DIAGNOSIS — N949 Unspecified condition associated with female genital organs and menstrual cycle: Secondary | ICD-10-CM | POA: Diagnosis not present

## 2016-02-25 DIAGNOSIS — R1030 Lower abdominal pain, unspecified: Secondary | ICD-10-CM

## 2016-02-25 DIAGNOSIS — Z87891 Personal history of nicotine dependence: Secondary | ICD-10-CM | POA: Diagnosis not present

## 2016-02-25 DIAGNOSIS — M549 Dorsalgia, unspecified: Secondary | ICD-10-CM

## 2016-02-25 DIAGNOSIS — N200 Calculus of kidney: Secondary | ICD-10-CM | POA: Diagnosis not present

## 2016-02-25 DIAGNOSIS — Z87448 Personal history of other diseases of urinary system: Secondary | ICD-10-CM | POA: Diagnosis not present

## 2016-02-25 DIAGNOSIS — I10 Essential (primary) hypertension: Secondary | ICD-10-CM | POA: Insufficient documentation

## 2016-02-25 DIAGNOSIS — R310 Gross hematuria: Secondary | ICD-10-CM | POA: Diagnosis not present

## 2016-02-25 DIAGNOSIS — R109 Unspecified abdominal pain: Secondary | ICD-10-CM

## 2016-02-25 LAB — POCT URINALYSIS DIPSTICK
Glucose, UA: 100
LEUKOCYTES UA: NEGATIVE
NITRITE UA: POSITIVE
PH UA: 5.5
Protein, UA: 30
RBC UA: NEGATIVE
SPEC GRAV UA: 1.025
UROBILINOGEN UA: 1

## 2016-02-25 LAB — URINALYSIS, ROUTINE W REFLEX MICROSCOPIC
GLUCOSE, UA: NEGATIVE mg/dL
HGB URINE DIPSTICK: NEGATIVE
Ketones, ur: 15 mg/dL — AB
Nitrite: POSITIVE — AB
PROTEIN: NEGATIVE mg/dL
Specific Gravity, Urine: 1.026 (ref 1.005–1.030)
pH: 6 (ref 5.0–8.0)

## 2016-02-25 LAB — CBC
HCT: 42 % (ref 36.0–46.0)
Hemoglobin: 14.1 g/dL (ref 12.0–15.0)
MCH: 29.9 pg (ref 26.0–34.0)
MCHC: 33.6 g/dL (ref 30.0–36.0)
MCV: 89 fL (ref 78.0–100.0)
PLATELETS: 231 10*3/uL (ref 150–400)
RBC: 4.72 MIL/uL (ref 3.87–5.11)
RDW: 14.5 % (ref 11.5–15.5)
WBC: 12.9 10*3/uL — ABNORMAL HIGH (ref 4.0–10.5)

## 2016-02-25 LAB — COMPREHENSIVE METABOLIC PANEL
ALBUMIN: 4.4 g/dL (ref 3.5–5.0)
ALT: 37 U/L (ref 14–54)
AST: 39 U/L (ref 15–41)
Alkaline Phosphatase: 55 U/L (ref 38–126)
Anion gap: 9 (ref 5–15)
BUN: 6 mg/dL (ref 6–20)
CHLORIDE: 110 mmol/L (ref 101–111)
CO2: 23 mmol/L (ref 22–32)
CREATININE: 0.68 mg/dL (ref 0.44–1.00)
Calcium: 9.7 mg/dL (ref 8.9–10.3)
GFR calc non Af Amer: >60 mL/min (ref >60)
GLUCOSE: 98 mg/dL (ref 65–99)
Potassium: 2.9 mmol/L — ABNORMAL LOW (ref 3.5–5.1)
SODIUM: 142 mmol/L (ref 135–145)
Total Bilirubin: 0.8 mg/dL (ref 0.3–1.2)
Total Protein: 6.8 g/dL (ref 6.5–8.1)

## 2016-02-25 LAB — WET PREP FOR TRICH, YEAST, CLUE
Clue Cells Wet Prep HPF POC: NONE SEEN
Trich, Wet Prep: NONE SEEN
Yeast Wet Prep HPF POC: NONE SEEN

## 2016-02-25 LAB — URINE MICROSCOPIC-ADD ON

## 2016-02-25 LAB — LIPASE, BLOOD: LIPASE: 39 U/L (ref 11–51)

## 2016-02-25 MED ORDER — MORPHINE SULFATE (PF) 4 MG/ML IV SOLN
4.0000 mg | Freq: Once | INTRAVENOUS | Status: AC
  Administered 2016-02-25: 4 mg via INTRAVENOUS
  Filled 2016-02-25: qty 1

## 2016-02-25 MED ORDER — POTASSIUM CHLORIDE 10 MEQ/100ML IV SOLN
10.0000 meq | Freq: Once | INTRAVENOUS | Status: AC
  Administered 2016-02-26: 10 meq via INTRAVENOUS
  Filled 2016-02-25: qty 100

## 2016-02-25 MED ORDER — POTASSIUM CHLORIDE CRYS ER 20 MEQ PO TBCR
40.0000 meq | EXTENDED_RELEASE_TABLET | Freq: Once | ORAL | Status: AC
  Administered 2016-02-25: 40 meq via ORAL
  Filled 2016-02-25: qty 2

## 2016-02-25 MED ORDER — ONDANSETRON HCL 4 MG/2ML IJ SOLN
4.0000 mg | Freq: Once | INTRAMUSCULAR | Status: AC
  Administered 2016-02-25: 4 mg via INTRAVENOUS
  Filled 2016-02-25: qty 2

## 2016-02-25 NOTE — ED Triage Notes (Signed)
Patient reports low abdominal pain / low back pain with dysuria , emesis and diarrhea onset last week , denies fever or chills , currently taking oral antibiotic for UTI .

## 2016-02-25 NOTE — ED Notes (Signed)
Pt states she is here for lower abd pain on both sides, previously diagnosed and treated for a UTI. States she called her PCP's office and they told her to come to the ED to seek further treatment and care. Pt states she is having pain during urination and cannot urinate very much.

## 2016-02-25 NOTE — Progress Notes (Addendum)
   Subjective:    Patient ID: Kathryn Cline, female    DOB: 09/25/1975, 40 y.o.   MRN: [id number]  HPI  Pt is a 40 yo pleasant female who presents to the clinic with daughter. She complains of continual symptoms of cystitis since 10/18. She saw Dr. Darene Lamer on 10/19 and was started on macrobid. She continues on macrobid. She feels like symptoms have improved some but not resolved. She denies any fever or chills. She fills like she is drinking a lot but struggling to urinate. She reports to be drinking 3-4 40oz cups of cranberry and water juice. She feels like she is urinating very little every 2-3 hours. She reports "her bladder feels like it is contracting". She has a hx of pyelonephritis. Culture on 10/19 was negative for bacteria in urine. She did have hematuria. She reports nausea and vomited a few times yesterday. She is on AzO as well.   Pt has one sexual partner and married. Risk of STD low.    Review of Systems    see HPI>  Objective:   Physical Exam  Constitutional: She is oriented to person, place, and time. She appears well-developed and well-nourished.  Obese.   Cardiovascular: Normal rate, regular rhythm and normal heart sounds.   Pulmonary/Chest: Effort normal and breath sounds normal.  Positive CVA tenderness.   Abdominal: Soft. Bowel sounds are normal. She exhibits no mass. There is no rebound and no guarding.  Suprapubic tenderness.   Genitourinary:  Genitourinary Comments: Erythematous external labia and introitus.  Painful bimanuel exam with cervical motion tenderness.  No active discharge.   Neurological: She is alert and oriented to person, place, and time.  Psychiatric: She has a normal mood and affect. Her behavior is normal.          Assessment & Plan:  Dysuria/nausea/vomiting/CVA tenderness/urine retention/cervical motion tenderness- unclear etiology. Urine culture showed no bacteria last week so very unlikely this is bacterial cystitis.  UA dipstick positive  for glucose, bilirubin, protein, and nitrates but patient on AZO.  Catherized in and out today with 75cc of urine.  Will culture sample.  Wet prep/GC/Chlamydia done in office today.  Due to CVA tenderness will get renal u/s.  CBC and cmp ordered.  Stay hydrated and will follow up with results as we get them.
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
## 2016-02-26 ENCOUNTER — Emergency Department (HOSPITAL_COMMUNITY): Payer: BLUE CROSS/BLUE SHIELD

## 2016-02-26 ENCOUNTER — Telehealth: Payer: Self-pay | Admitting: Osteopathic Medicine

## 2016-02-26 DIAGNOSIS — N200 Calculus of kidney: Secondary | ICD-10-CM | POA: Diagnosis not present

## 2016-02-26 LAB — COMPLETE METABOLIC PANEL WITH GFR
ALBUMIN: 4.6 g/dL (ref 3.6–5.1)
ALK PHOS: 60 U/L (ref 33–115)
ALT: 33 U/L — AB (ref 6–29)
AST: 35 U/L — AB (ref 10–30)
BUN: 6 mg/dL — AB (ref 7–25)
CALCIUM: 9.5 mg/dL (ref 8.6–10.2)
CHLORIDE: 107 mmol/L (ref 98–110)
CO2: 24 mmol/L (ref 20–31)
CREATININE: 0.72 mg/dL (ref 0.50–1.10)
GFR, Est African American: >89 mL/min (ref >=60)
GFR, Est Non African American: >89 mL/min (ref >=60)
GLUCOSE: 86 mg/dL (ref 65–99)
Potassium: 3.6 mmol/L (ref 3.5–5.3)
SODIUM: 143 mmol/L (ref 135–146)
Total Bilirubin: 0.7 mg/dL (ref 0.2–1.2)
Total Protein: 6.9 g/dL (ref 6.1–8.1)

## 2016-02-26 LAB — DIFFERENTIAL
BASOS ABS: 0 10*3/uL (ref 0.0–0.1)
BASOS PCT: 0 %
EOS PCT: 2 %
Eosinophils Absolute: 0.3 10*3/uL (ref 0.0–0.7)
LYMPHS ABS: 3.6 10*3/uL (ref 0.7–4.0)
Lymphocytes Relative: 28 %
MONOS PCT: 7 %
Monocytes Absolute: 0.9 10*3/uL (ref 0.1–1.0)
Neutro Abs: 8.1 10*3/uL — ABNORMAL HIGH (ref 1.7–7.7)
Neutrophils Relative %: 63 %

## 2016-02-26 LAB — CBC WITH DIFFERENTIAL/PLATELET
BASOS PCT: 0 %
Basophils Absolute: 0 cells/uL (ref 0–200)
EOS PCT: 2 %
Eosinophils Absolute: 254 cells/uL (ref 15–500)
HEMATOCRIT: 41.3 % (ref 35.0–45.0)
HEMOGLOBIN: 13.9 g/dL (ref 11.7–15.5)
LYMPHS ABS: 3937 {cells}/uL — AB (ref 850–3900)
Lymphocytes Relative: 31 %
MCH: 29.5 pg (ref 27.0–33.0)
MCHC: 33.7 g/dL (ref 32.0–36.0)
MCV: 87.7 fL (ref 80.0–100.0)
MONO ABS: 635 {cells}/uL (ref 200–950)
MPV: 11.4 fL (ref 7.5–12.5)
Monocytes Relative: 5 %
NEUTROS ABS: 7874 {cells}/uL — AB (ref 1500–7800)
Neutrophils Relative %: 62 %
Platelets: 250 10*3/uL (ref 140–400)
RBC: 4.71 MIL/uL (ref 3.80–5.10)
RDW: 15.1 % — ABNORMAL HIGH (ref 11.0–15.0)
WBC: 12.7 10*3/uL — AB (ref 3.8–10.8)

## 2016-02-26 LAB — GC/CHLAMYDIA PROBE AMP
CT PROBE, AMP APTIMA: NOT DETECTED
GC Probe RNA: NOT DETECTED

## 2016-02-26 MED ORDER — MORPHINE SULFATE (PF) 4 MG/ML IV SOLN
4.0000 mg | Freq: Once | INTRAVENOUS | Status: AC
  Administered 2016-02-26: 4 mg via INTRAVENOUS
  Filled 2016-02-26: qty 1

## 2016-02-26 MED ORDER — IOPAMIDOL (ISOVUE-300) INJECTION 61%
INTRAVENOUS | Status: AC
  Administered 2016-02-26: 30 mL
  Filled 2016-02-26: qty 30

## 2016-02-26 MED ORDER — POTASSIUM CHLORIDE CRYS ER 20 MEQ PO TBCR: 20.0000 meq | EXTENDED_RELEASE_TABLET | Freq: Two times a day (BID) | ORAL | 0 refills | Status: DC

## 2016-02-26 MED ORDER — CEPHALEXIN 500 MG PO CAPS: 500.0000 mg | ORAL_CAPSULE | Freq: Four times a day (QID) | ORAL | 0 refills | Status: DC

## 2016-02-26 MED ORDER — IOPAMIDOL (ISOVUE-300) INJECTION 61%
INTRAVENOUS | Status: AC
  Administered 2016-02-26: 100 mL
  Filled 2016-02-26: qty 100

## 2016-02-26 MED ORDER — HYDROCODONE-ACETAMINOPHEN 5-325 MG PO TABS: 1.0000 | ORAL_TABLET | ORAL | 0 refills | Status: DC | PRN

## 2016-02-26 MED ORDER — ONDANSETRON HCL 4 MG PO TABS: 4.0000 mg | ORAL_TABLET | Freq: Four times a day (QID) | ORAL | 0 refills | Status: DC | PRN

## 2016-02-26 MED ORDER — CEPHALEXIN 250 MG PO CAPS
500.0000 mg | ORAL_CAPSULE | Freq: Once | ORAL | Status: AC
  Administered 2016-02-26: 500 mg via ORAL
  Filled 2016-02-26: qty 2

## 2016-02-26 NOTE — Discharge Instructions (Signed)
Here urine did not show clear evidence of an infection. However, because of knee pain and some question of some bacteria in the urine, you're being given a new antibiotic. Please take it as directed. Drink plenty of fluids. Follow-up with PCP. Return to the ED if symptoms are getting worse.

## 2016-02-26 NOTE — ED Notes (Signed)
Ice chips given upon request

## 2016-02-26 NOTE — ED Notes (Signed)
Pt transported to CT ?

## 2016-02-26 NOTE — ED Notes (Signed)
Discharge instructions and prescriptions reviewed - voiced understanding

## 2016-02-26 NOTE — Telephone Encounter (Signed)
As provider on call for the practice, I received a call re: STAT renal US results concerning for an abscess in patient with history of possible pyelonephritis, recommending follow-up with MRI or CT with & without contrast for further evaluation. After review of the record and personal discussion with the patient, opted to transfer to emergency department since having persistent significant flank pain and urinary obstructive symptoms, need evaluation for sepsis and possible surgical consult. Recomendations were explained to the patient. She opts to go to [facility] emergency room. I called their triage and alerted them that the patient should be heading over by private vehicle and informed triage of the above situation.

## 2016-02-27 ENCOUNTER — Other Ambulatory Visit: Payer: Self-pay | Admitting: Physician Assistant

## 2016-02-27 LAB — URINE CULTURE: ORGANISM ID, BACTERIA: NO GROWTH

## 2016-02-27 MED ORDER — HYDROCORTISONE VALERATE 0.2 % EX OINT: 1.0000 "application " | TOPICAL_OINTMENT | Freq: Two times a day (BID) | CUTANEOUS | 0 refills | Status: DC

## 2016-02-28 ENCOUNTER — Ambulatory Visit (HOSPITAL_COMMUNITY): Payer: Self-pay | Admitting: Licensed Clinical Social Worker

## 2016-02-29 ENCOUNTER — Ambulatory Visit (INDEPENDENT_AMBULATORY_CARE_PROVIDER_SITE_OTHER): Payer: BLUE CROSS/BLUE SHIELD | Admitting: Physician Assistant

## 2016-02-29 ENCOUNTER — Encounter: Payer: Self-pay | Admitting: Physician Assistant

## 2016-02-29 VITALS — BP 116/67 | HR 72 | Ht 69.0 in | Wt 265.0 lb

## 2016-02-29 DIAGNOSIS — Z9889 Other specified postprocedural states: Secondary | ICD-10-CM

## 2016-02-29 DIAGNOSIS — R350 Frequency of micturition: Secondary | ICD-10-CM | POA: Diagnosis not present

## 2016-02-29 DIAGNOSIS — N390 Urinary tract infection, site not specified: Secondary | ICD-10-CM | POA: Diagnosis not present

## 2016-02-29 DIAGNOSIS — E876 Hypokalemia: Secondary | ICD-10-CM

## 2016-02-29 DIAGNOSIS — R109 Unspecified abdominal pain: Secondary | ICD-10-CM

## 2016-02-29 DIAGNOSIS — N898 Other specified noninflammatory disorders of vagina: Secondary | ICD-10-CM | POA: Diagnosis not present

## 2016-02-29 LAB — POCT URINALYSIS DIPSTICK
Blood, UA: NEGATIVE
Glucose, UA: NEGATIVE
Ketones, UA: NEGATIVE
LEUKOCYTES UA: NEGATIVE
NITRITE UA: NEGATIVE
PH UA: 5.5
PROTEIN UA: NEGATIVE
Spec Grav, UA: >=1.03
UROBILINOGEN UA: 0.2

## 2016-02-29 NOTE — Progress Notes (Signed)
   Subjective:    Patient ID: Kathryn Cline, female    DOB: July 23, 1975, 40 y.o.   MRN: [id number]  HPI  Pt is here to follow up after ED visit on 10/23 after suspicious renal ultrasound.   Lower abdominal pain and bilateral flank pain worrisome for possible urinary infection or urolithiasis. Old records are reviewed, and she had been seen by her PCP and prescribed Macrodantin for cystitis. Urinalysis on that visit was acting normal and culture was sterile. She was seen in the office today and sent for ultrasound which showed questionable left renal abscess and was referred here for CT scan. Here, urinalysis has positive nitrite because of color interference. The urine itself appears to be a mildly contaminated assessment but does have many bacteria. Electrolytes show very low potassium-probably related to nausea and vomiting. She is given oral and intravenous potassium. She is given morphine for pain with good, temporary relief but she did require a second injection. CT is obtained showing no evidence of renal abscess. She does have punctate left renal calculi which are not clinically significant tonight but may cause pain in the future. These findings were explained to the patient. She will be changed to cephalexin for antibiotic and is discharged with prescriptions for hydrocodone-acetaminophen, ondansetron, and K-Dur. Follow-up with PCP in 2 days. Return precautions discussed.  Above is her hospital course. She continues to be on keflex. Denies any fever, chills, flank pain. She does have pressure in her lower abdomin. She urinates frequently but not a lot comes out. She is drinking 40oz of water 2-3 times a day. She did have a bladder tac when she was 81 with hysterectomy. Continues to have some vaginal irritations. She never picked up [place] cort cream.     Review of Systems  All other systems reviewed and are negative.      Objective:   Physical Exam  Constitutional: She is oriented to  person, place, and time. She appears well-developed and well-nourished.  Obese.   HENT:  Head: Normocephalic and atraumatic.  Cardiovascular: Normal rate, regular rhythm and normal heart sounds.   Pulmonary/Chest: Effort normal and breath sounds normal.  NO CVA tenderness.   Neurological: She is alert and oriented to person, place, and time.  Psychiatric: She has a normal mood and affect. Her behavior is normal.          Assessment & Plan:  Marland KitchenMarland KitchenDiagnoses and all orders for this visit:  Urinary tract infection without hematuria, site unspecified -     POCT urinalysis dipstick -     Ambulatory referral to Urology  Vaginal irritation -     Ambulatory referral to Urology  Urinary frequency -     Ambulatory referral to Urology  Hypokalemia -     Basic metabolic panel  Abdominal pressure -     Ambulatory referral to Urology  History of bladder repair surgery -     Ambulatory referral to Urology   Finish keflex. Will check bmp to make sure potassium looks good.  Will make referall due to hx of bladder repair and symptoms despite normal culture and urinalysis.  Get [place] cort to use for external vaginal irritation let me know in next 3 days if helping.

## 2016-03-17 ENCOUNTER — Other Ambulatory Visit (HOSPITAL_COMMUNITY): Payer: Self-pay | Admitting: Medical

## 2016-03-24 DIAGNOSIS — N3010 Interstitial cystitis (chronic) without hematuria: Secondary | ICD-10-CM | POA: Diagnosis not present

## 2016-03-24 DIAGNOSIS — N200 Calculus of kidney: Secondary | ICD-10-CM | POA: Diagnosis not present

## 2016-04-23 ENCOUNTER — Ambulatory Visit (INDEPENDENT_AMBULATORY_CARE_PROVIDER_SITE_OTHER): Payer: BLUE CROSS/BLUE SHIELD | Admitting: Physician Assistant

## 2016-04-23 ENCOUNTER — Encounter: Payer: Self-pay | Admitting: Physician Assistant

## 2016-04-23 VITALS — BP 108/74 | HR 87 | Resp 18 | Wt 245.0 lb

## 2016-04-23 DIAGNOSIS — Z8719 Personal history of other diseases of the digestive system: Secondary | ICD-10-CM | POA: Insufficient documentation

## 2016-04-23 DIAGNOSIS — Z9889 Other specified postprocedural states: Secondary | ICD-10-CM

## 2016-04-23 DIAGNOSIS — R1032 Left lower quadrant pain: Secondary | ICD-10-CM | POA: Diagnosis not present

## 2016-04-23 HISTORY — DX: Personal history of other diseases of the digestive system: Z87.19

## 2016-04-23 NOTE — Patient Instructions (Signed)
Inguinal Hernia, Adult Introduction An inguinal hernia is when fat or the intestines push through the area where the leg meets the lower belly (groin) and make a rounded lump (bulge). This condition happens over time. There are three types of inguinal hernias. These types include:  Hernias that can be pushed back into the belly (are reducible).  Hernias that cannot be pushed back into the belly (are incarcerated).  Hernias that cannot be pushed back into the belly and lose their blood supply (get strangulated). This type needs emergency surgery. Follow these instructions at home: Lifestyle  Drink enough fluid to keep your urine (pee) clear or pale yellow.  Eat plenty of fruits, vegetables, and whole grains. These have a lot of fiber. Talk with your doctor if you have questions.  Avoid lifting heavy objects.  Avoid standing for long periods of time.  Do not use tobacco products. These include cigarettes, chewing tobacco, or e-cigarettes. If you need help quitting, ask your doctor.  Try to stay at a healthy weight. General instructions  Do not try to force the hernia back in.  Watch your hernia for any changes in color or size. Let your doctor know if there are any changes.  Take over-the-counter and prescription medicines only as told by your doctor.  Keep all follow-up visits as told by your doctor. This is important. Contact a doctor if:  You have a fever.  You have new symptoms.  Your symptoms get worse. Get help right away if:  The area where the legs meets the lower belly has:  Pain that gets worse suddenly.  A bulge that gets bigger suddenly and does not go down.  A bulge that turns red or purple.  A bulge that is painful to the touch.  You are a man and your scrotum:  Suddenly feels painful.  Suddenly changes in size.  You feel sick to your stomach (nauseous) and this feeling does not go away.  You throw up (vomit) and this keeps happening.  You feel  your heart beating a lot more quickly than normal.  You cannot poop (have a bowel movement) or pass gas. This information is not intended to replace advice given to you by your health care provider. Make sure you discuss any questions you have with your health care provider. Document Released: 05/22/2006 Document Revised: 09/27/2015 Document Reviewed: 03/01/2014  2017 Elsevier

## 2016-04-23 NOTE — Progress Notes (Signed)
   Subjective:    Patient ID: Kathryn Cline, female    DOB: 04-Sep-1975, 40 y.o.   MRN: [id number]  HPI  Pt is a 40 yo morbidly obese female who presents to the clinic with left inguinal pain and pressure. She has a hx of umbilicus hernia that was repaired in September by Dr. Johney Maine at [facility]. About 1 month ago she started feeling pressure in left lower inguinal area. At first she felt she could "push back in" and pain would go away. It seems it has started to be more persistent. At times she will double over in pain. She is not able to always feel like she can "push back in". She just moved into a new house and been doing a lot of heavy lifting. She denies any fever, chills, constipation, diarrhea. No dysuria or flank pain. At times if feels like her left side is "ripping".       Review of Systems    see HPII.  Objective:   Physical Exam  Constitutional: She is oriented to person, place, and time. She appears well-developed and well-nourished.  Morbid obese.   HENT:  Head: Normocephalic and atraumatic.  Cardiovascular: Normal rate, regular rhythm and normal heart sounds.   Pulmonary/Chest: Effort normal and breath sounds normal.  No CVA tenderness.   Abdominal: Soft. Bowel sounds are normal. She exhibits no distension and no mass. There is tenderness. There is no rebound and no guarding.  Mild tenderness to palpation under panis of abdomen. No apparent bulge palpated.   Neurological: She is alert and oriented to person, place, and time.  Psychiatric: She has a normal mood and affect. Her behavior is normal.          Assessment & Plan:  Marland KitchenMarland KitchenDiagnoses and all orders for this visit:  H/O ventral hernia repair  History of umbilical hernia repair -     CT PELVIS WO CONTRAST; Future -     CT PELVIS WO CONTRAST  Left inguinal pain -     CT PELVIS WO CONTRAST; Future -     CT PELVIS WO CONTRAST   PE could be obstructed due to body habitus. Will get CT scan to confirm if hernia is  present. If present will send back to [facility] and Dr. Johney Maine. Discussed red flags of strangulated hernia and to go to ER if pain does not let up.

## 2016-04-24 ENCOUNTER — Telehealth: Payer: Self-pay | Admitting: Physician Assistant

## 2016-04-24 ENCOUNTER — Ambulatory Visit (INDEPENDENT_AMBULATORY_CARE_PROVIDER_SITE_OTHER): Payer: BLUE CROSS/BLUE SHIELD

## 2016-04-24 DIAGNOSIS — M11252 Other chondrocalcinosis, left hip: Secondary | ICD-10-CM

## 2016-04-24 DIAGNOSIS — R109 Unspecified abdominal pain: Secondary | ICD-10-CM

## 2016-04-24 DIAGNOSIS — R262 Difficulty in walking, not elsewhere classified: Secondary | ICD-10-CM | POA: Diagnosis not present

## 2016-04-24 NOTE — Telephone Encounter (Signed)
Pt stated that she wants to go ahead and see the surgeon Dr. Johney Maine because she cant take the pain anymore. Thanks

## 2016-04-25 DIAGNOSIS — R109 Unspecified abdominal pain: Secondary | ICD-10-CM | POA: Diagnosis not present

## 2016-04-25 NOTE — Progress Notes (Signed)
No evidence of hernia on exam. Are you having good regular bowel movements? At this point I have no reason to send you to general surgery.

## 2016-04-25 NOTE — Telephone Encounter (Signed)
Labs ordered.

## 2016-04-25 NOTE — Telephone Encounter (Signed)
See NOTES of CT scan. I still having pain need to consider other DX ok to order CBC and CMP. To look for signs of infection.

## 2016-04-26 LAB — CBC WITH DIFFERENTIAL/PLATELET
BASOS PCT: 0 %
Basophils Absolute: 0 cells/uL (ref 0–200)
EOS ABS: 339 {cells}/uL (ref 15–500)
Eosinophils Relative: 3 %
HCT: 41.7 % (ref 35.0–45.0)
HEMOGLOBIN: 14 g/dL (ref 11.7–15.5)
LYMPHS ABS: 3503 {cells}/uL (ref 850–3900)
Lymphocytes Relative: 31 %
MCH: 29.8 pg (ref 27.0–33.0)
MCHC: 33.6 g/dL (ref 32.0–36.0)
MCV: 88.7 fL (ref 80.0–100.0)
MONO ABS: 565 {cells}/uL (ref 200–950)
MONOS PCT: 5 %
MPV: 11.5 fL (ref 7.5–12.5)
NEUTROS ABS: 6893 {cells}/uL (ref 1500–7800)
Neutrophils Relative %: 61 %
PLATELETS: 225 10*3/uL (ref 140–400)
RBC: 4.7 MIL/uL (ref 3.80–5.10)
RDW: 14 % (ref 11.0–15.0)
WBC: 11.3 10*3/uL — ABNORMAL HIGH (ref 3.8–10.8)

## 2016-04-26 LAB — COMPLETE METABOLIC PANEL WITH GFR
ALT: 27 U/L (ref 6–29)
AST: 27 U/L (ref 10–30)
Albumin: 4.2 g/dL (ref 3.6–5.1)
Alkaline Phosphatase: 51 U/L (ref 33–115)
BILIRUBIN TOTAL: 0.4 mg/dL (ref 0.2–1.2)
BUN: 7 mg/dL (ref 7–25)
CHLORIDE: 106 mmol/L (ref 98–110)
CO2: 20 mmol/L (ref 20–31)
Calcium: 9.1 mg/dL (ref 8.6–10.2)
Creat: 0.73 mg/dL (ref 0.50–1.10)
Glucose, Bld: 103 mg/dL — ABNORMAL HIGH (ref 65–99)
POTASSIUM: 3.5 mmol/L (ref 3.5–5.3)
SODIUM: 141 mmol/L (ref 135–146)
Total Protein: 6.8 g/dL (ref 6.1–8.1)

## 2016-04-29 ENCOUNTER — Other Ambulatory Visit: Payer: Self-pay | Admitting: Physician Assistant

## 2016-04-29 MED ORDER — CIPROFLOXACIN HCL 500 MG PO TABS: 500.0000 mg | ORAL_TABLET | Freq: Two times a day (BID) | ORAL | 0 refills | Status: DC

## 2016-04-29 MED ORDER — METRONIDAZOLE 500 MG PO TABS: 500.0000 mg | ORAL_TABLET | Freq: Two times a day (BID) | ORAL | 0 refills | Status: AC

## 2016-04-29 NOTE — Telephone Encounter (Signed)
Sent treatment for diverticulitis. Will see if symptoms improve.

## 2016-05-02 ENCOUNTER — Encounter: Payer: Self-pay | Admitting: Sports Medicine

## 2016-05-03 ENCOUNTER — Emergency Department (HOSPITAL_BASED_OUTPATIENT_CLINIC_OR_DEPARTMENT_OTHER)
Admission: EM | Admit: 2016-05-03 | Discharge: 2016-05-03 | Disposition: A | Discharge status: Home / Self Care | Payer: BLUE CROSS/BLUE SHIELD | Attending: Emergency Medicine | Admitting: Emergency Medicine

## 2016-05-03 ENCOUNTER — Encounter (HOSPITAL_BASED_OUTPATIENT_CLINIC_OR_DEPARTMENT_OTHER): Payer: Self-pay | Admitting: Emergency Medicine

## 2016-05-03 DIAGNOSIS — Z79899 Other long term (current) drug therapy: Secondary | ICD-10-CM | POA: Insufficient documentation

## 2016-05-03 DIAGNOSIS — I10 Essential (primary) hypertension: Secondary | ICD-10-CM | POA: Insufficient documentation

## 2016-05-03 DIAGNOSIS — Z87891 Personal history of nicotine dependence: Secondary | ICD-10-CM | POA: Insufficient documentation

## 2016-05-03 DIAGNOSIS — Z791 Long term (current) use of non-steroidal anti-inflammatories (NSAID): Secondary | ICD-10-CM | POA: Insufficient documentation

## 2016-05-03 DIAGNOSIS — J45909 Unspecified asthma, uncomplicated: Secondary | ICD-10-CM | POA: Insufficient documentation

## 2016-05-03 DIAGNOSIS — M25552 Pain in left hip: Secondary | ICD-10-CM | POA: Diagnosis present

## 2016-05-03 DIAGNOSIS — M11252 Other chondrocalcinosis, left hip: Secondary | ICD-10-CM | POA: Insufficient documentation

## 2016-05-03 LAB — COMPREHENSIVE METABOLIC PANEL
ALT: 34 U/L (ref 14–54)
AST: 35 U/L (ref 15–41)
Albumin: 4.3 g/dL (ref 3.5–5.0)
Alkaline Phosphatase: 45 U/L (ref 38–126)
Anion gap: 9 (ref 5–15)
BUN: 9 mg/dL (ref 6–20)
CHLORIDE: 106 mmol/L (ref 101–111)
CO2: 24 mmol/L (ref 22–32)
CREATININE: 0.61 mg/dL (ref 0.44–1.00)
Calcium: 9.3 mg/dL (ref 8.9–10.3)
Glucose, Bld: 89 mg/dL (ref 65–99)
Potassium: 3.5 mmol/L (ref 3.5–5.1)
Sodium: 139 mmol/L (ref 135–145)
TOTAL PROTEIN: 7.1 g/dL (ref 6.5–8.1)
Total Bilirubin: 0.3 mg/dL (ref 0.3–1.2)

## 2016-05-03 LAB — URINALYSIS, ROUTINE W REFLEX MICROSCOPIC
Bilirubin Urine: NEGATIVE
GLUCOSE, UA: NEGATIVE mg/dL
Hgb urine dipstick: NEGATIVE
Ketones, ur: NEGATIVE mg/dL
LEUKOCYTES UA: NEGATIVE
NITRITE: NEGATIVE
PROTEIN: NEGATIVE mg/dL
Specific Gravity, Urine: 1.019 (ref 1.005–1.030)
pH: 7 (ref 5.0–8.0)

## 2016-05-03 LAB — CBC
HCT: 42 % (ref 36.0–46.0)
Hemoglobin: 13.8 g/dL (ref 12.0–15.0)
MCH: 30.2 pg (ref 26.0–34.0)
MCHC: 32.9 g/dL (ref 30.0–36.0)
MCV: 91.9 fL (ref 78.0–100.0)
PLATELETS: 218 10*3/uL (ref 150–400)
RBC: 4.57 MIL/uL (ref 3.87–5.11)
RDW: 13.6 % (ref 11.5–15.5)
WBC: 11.1 10*3/uL — AB (ref 4.0–10.5)

## 2016-05-03 LAB — LIPASE, BLOOD: LIPASE: 21 U/L (ref 11–51)

## 2016-05-03 MED ORDER — KETOROLAC TROMETHAMINE 60 MG/2ML IM SOLN
60.0000 mg | Freq: Once | INTRAMUSCULAR | Status: AC
  Administered 2016-05-03: 60 mg via INTRAMUSCULAR
  Filled 2016-05-03: qty 2

## 2016-05-03 MED ORDER — HYDROCODONE-ACETAMINOPHEN 5-325 MG PO TABS: 1.0000 | ORAL_TABLET | Freq: Four times a day (QID) | ORAL | 0 refills | Status: DC | PRN

## 2016-05-03 MED ORDER — METHYLPREDNISOLONE 4 MG PO TBPK: ORAL_TABLET | ORAL | 0 refills | Status: DC

## 2016-05-03 NOTE — ED Triage Notes (Addendum)
Patient reports lower abdominal pain x 2 weeks.  States she was put in antibiotics for possible UTI.  Reports unable to keep antibiotics nausea, vomiting.  Reports two episodes of dark stools.
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
## 2016-05-08 ENCOUNTER — Ambulatory Visit (INDEPENDENT_AMBULATORY_CARE_PROVIDER_SITE_OTHER): Payer: BLUE CROSS/BLUE SHIELD | Admitting: Sports Medicine

## 2016-05-08 DIAGNOSIS — M160 Bilateral primary osteoarthritis of hip: Secondary | ICD-10-CM | POA: Diagnosis not present

## 2016-05-08 DIAGNOSIS — Z6838 Body mass index (BMI) 38.0-38.9, adult: Secondary | ICD-10-CM | POA: Diagnosis not present

## 2016-05-08 NOTE — Assessment & Plan Note (Signed)
She really does need to do some physical therapy and lose some weight, she's lost some weight with diet and exercise but I do think she needs more of a drastic solution to prevent progression to total hip arthroplasty, I'm going to place a referral for bariatric surgery.

## 2016-05-08 NOTE — Assessment & Plan Note (Addendum)
Left hip pain with osteoarthritis, she does have some calcinosis of the hip joint capsule likely from repeated trauma and scarring from injections. Her clinical picture is not consistent with crystalline arthropathy. Left hip injection as above, previous injection was bilateral in 4 months ago.  Return as needed. She really does need to do some physical therapy and lose some weight, she's lost some weight with diet and exercise but I do think she needs more of a drastic solution to prevent progression to total hip arthroplasty, I'm going to place a referral for bariatric surgery.

## 2016-05-08 NOTE — Progress Notes (Signed)
  Subjective:    CC: Left hip pain  HPI: This is a pleasant 41 year old female with hip osteoarthritis, she has really not done all that great with oral NSAIDs and rehabilitation exercises, has lost some weight but not a whole lot. We did bilateral hip injections about 4 months ago, more recently she's having increase in left hip pain, she was seen in the emergency department while we were closed where imaging showed some evidence of calcinosis of the anterior joint capsule. This is precisely reintubated hip joint and I think that this is simply scarring from repeated injury into the joint capsule itself.  She is also exquisitely obese, she's lost some weight on her own but really needs more drastic measures.  Past medical history:  Negative.  See flowsheet/record as well for more information.  Surgical history: Negative.  See flowsheet/record as well for more information.  Family history: Negative.  See flowsheet/record as well for more information.  Social history: Negative.  See flowsheet/record as well for more information.  Allergies, and medications have been entered into the medical record, reviewed, and no changes needed.   Review of Systems: No fevers, chills, night sweats, weight loss, chest pain, or shortness of breath.   Objective:    General: Well Developed, well nourished, and in no acute distress.  Neuro: Alert and oriented x3, extra-ocular muscles intact, sensation grossly intact.  HEENT: Normocephalic, atraumatic, pupils equal round reactive to light, neck supple, no masses, no lymphadenopathy, thyroid nonpalpable.  Skin: Warm and dry, no rashes. Cardiac: Regular rate and rhythm, no murmurs rubs or gallops, no lower extremity edema.  Respiratory: Clear to auscultation bilaterally. Not using accessory muscles, speaking in full sentences. Left hip: I am able to reproduce her pain with internal rotation.  Procedure: Real-time Ultrasound Guided Injection of left hip  joint Device: GE Logiq E  Verbal informed consent obtained.  Time-out conducted.  Noted no overlying erythema, induration, or other signs of local infection.  Skin prepped in a sterile fashion.  Local anesthesia: Topical Ethyl chloride.  With sterile technique and under real time ultrasound guidance:  Using a 22-gauge spinal needle advanced to the femoral head/neck junction, contacted bone and 1 mL kenalog 40, 2 mL lidocaine, 2 mL Marcaine injected easily. Completed without difficulty  Pain immediately resolved suggesting accurate placement of the medication.  Advised to call if fevers/chills, erythema, induration, drainage, or persistent bleeding.  Images permanently stored and available for review in the ultrasound unit.  Impression: Technically successful ultrasound guided injection.  Impression and Recommendations:    Osteoarthritis of both hips Left hip pain with osteoarthritis, she does have some calcinosis of the hip joint capsule likely from repeated trauma and scarring from injections. Her clinical picture is not consistent with crystalline arthropathy. Left hip injection as above, previous injection was bilateral in 4 months ago.  Return as needed. She really does need to do some physical therapy and lose some weight, she's lost some weight with diet and exercise but I do think she needs more of a drastic solution to prevent progression to total hip arthroplasty, I'm going to place a referral for bariatric surgery.  Severe obesity (BMI >= 40) She really does need to do some physical therapy and lose some weight, she's lost some weight with diet and exercise but I do think she needs more of a drastic solution to prevent progression to total hip arthroplasty, I'm going to place a referral for bariatric surgery.

## 2016-05-13 ENCOUNTER — Other Ambulatory Visit: Payer: Self-pay | Admitting: Physician Assistant

## 2016-05-15 ENCOUNTER — Ambulatory Visit (INDEPENDENT_AMBULATORY_CARE_PROVIDER_SITE_OTHER): Payer: Self-pay | Admitting: Medical

## 2016-05-15 ENCOUNTER — Encounter (HOSPITAL_COMMUNITY): Payer: Self-pay | Admitting: Medical

## 2016-05-15 DIAGNOSIS — F331 Major depressive disorder, recurrent, moderate: Secondary | ICD-10-CM

## 2016-05-15 DIAGNOSIS — F4312 Post-traumatic stress disorder, chronic: Secondary | ICD-10-CM

## 2016-05-15 DIAGNOSIS — G894 Chronic pain syndrome: Secondary | ICD-10-CM

## 2016-05-15 DIAGNOSIS — T7422XS Child sexual abuse, confirmed, sequela: Secondary | ICD-10-CM

## 2016-05-15 DIAGNOSIS — Z5329 Procedure and treatment not carried out because of patient's decision for other reasons: Secondary | ICD-10-CM

## 2016-05-15 NOTE — Progress Notes (Signed)
Patient ID: Kathryn Cline, female   DOB: 03/14/1976, 41 y.o.   MRN: [id number] No Show for FU

## 2016-05-29 DIAGNOSIS — S92505A Nondisplaced unspecified fracture of left lesser toe(s), initial encounter for closed fracture: Secondary | ICD-10-CM | POA: Diagnosis not present

## 2016-05-29 DIAGNOSIS — J45909 Unspecified asthma, uncomplicated: Secondary | ICD-10-CM | POA: Diagnosis not present

## 2016-05-29 DIAGNOSIS — Z888 Allergy status to other drugs, medicaments and biological substances status: Secondary | ICD-10-CM | POA: Diagnosis not present

## 2016-05-29 DIAGNOSIS — F329 Major depressive disorder, single episode, unspecified: Secondary | ICD-10-CM | POA: Diagnosis not present

## 2016-05-29 DIAGNOSIS — Z9049 Acquired absence of other specified parts of digestive tract: Secondary | ICD-10-CM | POA: Diagnosis not present

## 2016-05-29 DIAGNOSIS — S92515A Nondisplaced fracture of proximal phalanx of left lesser toe(s), initial encounter for closed fracture: Secondary | ICD-10-CM | POA: Diagnosis not present

## 2016-05-29 DIAGNOSIS — Z9889 Other specified postprocedural states: Secondary | ICD-10-CM | POA: Diagnosis not present

## 2016-05-29 DIAGNOSIS — M25572 Pain in left ankle and joints of left foot: Secondary | ICD-10-CM | POA: Diagnosis not present

## 2016-05-29 DIAGNOSIS — F17210 Nicotine dependence, cigarettes, uncomplicated: Secondary | ICD-10-CM | POA: Diagnosis not present

## 2016-05-29 DIAGNOSIS — M79672 Pain in left foot: Secondary | ICD-10-CM | POA: Diagnosis not present

## 2016-05-29 DIAGNOSIS — Z90710 Acquired absence of both cervix and uterus: Secondary | ICD-10-CM | POA: Diagnosis not present

## 2016-05-29 DIAGNOSIS — Z885 Allergy status to narcotic agent status: Secondary | ICD-10-CM | POA: Diagnosis not present

## 2016-05-29 DIAGNOSIS — M1990 Unspecified osteoarthritis, unspecified site: Secondary | ICD-10-CM | POA: Diagnosis not present

## 2016-05-29 DIAGNOSIS — S99912A Unspecified injury of left ankle, initial encounter: Secondary | ICD-10-CM | POA: Diagnosis not present

## 2016-05-29 DIAGNOSIS — Z79899 Other long term (current) drug therapy: Secondary | ICD-10-CM | POA: Diagnosis not present

## 2016-05-29 DIAGNOSIS — G629 Polyneuropathy, unspecified: Secondary | ICD-10-CM | POA: Diagnosis not present

## 2016-05-29 DIAGNOSIS — M797 Fibromyalgia: Secondary | ICD-10-CM | POA: Diagnosis not present

## 2016-05-29 DIAGNOSIS — I10 Essential (primary) hypertension: Secondary | ICD-10-CM | POA: Diagnosis not present

## 2016-05-29 DIAGNOSIS — Z791 Long term (current) use of non-steroidal anti-inflammatories (NSAID): Secondary | ICD-10-CM | POA: Diagnosis not present

## 2016-05-30 DIAGNOSIS — S99912A Unspecified injury of left ankle, initial encounter: Secondary | ICD-10-CM | POA: Diagnosis not present

## 2016-06-02 ENCOUNTER — Ambulatory Visit (INDEPENDENT_AMBULATORY_CARE_PROVIDER_SITE_OTHER): Payer: BLUE CROSS/BLUE SHIELD | Admitting: Sports Medicine

## 2016-06-02 ENCOUNTER — Ambulatory Visit (INDEPENDENT_AMBULATORY_CARE_PROVIDER_SITE_OTHER): Payer: BLUE CROSS/BLUE SHIELD

## 2016-06-02 ENCOUNTER — Encounter: Payer: Self-pay | Admitting: Sports Medicine

## 2016-06-02 DIAGNOSIS — S92912D Unspecified fracture of left toe(s), subsequent encounter for fracture with routine healing: Secondary | ICD-10-CM

## 2016-06-02 DIAGNOSIS — S92515K Nondisplaced fracture of proximal phalanx of left lesser toe(s), subsequent encounter for fracture with nonunion: Secondary | ICD-10-CM

## 2016-06-02 DIAGNOSIS — W19XXXD Unspecified fall, subsequent encounter: Secondary | ICD-10-CM

## 2016-06-02 DIAGNOSIS — S99922D Unspecified injury of left foot, subsequent encounter: Secondary | ICD-10-CM

## 2016-06-02 DIAGNOSIS — S92515A Nondisplaced fracture of proximal phalanx of left lesser toe(s), initial encounter for closed fracture: Secondary | ICD-10-CM | POA: Diagnosis not present

## 2016-06-02 NOTE — Addendum Note (Signed)
Addended by: [name] on: 06/02/2016 10:55 AM   Modules accepted: Orders

## 2016-06-02 NOTE — Progress Notes (Signed)
   Subjective:    I'm seeing this patient as a consultation for:  Dr. Dietrich Pates, [facility] emergency department  CC: Left foot fracture  HPI: This is a pleasant 41 year old female, she tripped over some cans of soda, had immediate pain, swelling, bruising, went to the burn department where x-rays showed fractures of the second and third toes. She was given a great pain medication and is referred here for further evaluation and definitive treatment. Pain is moderate, persistent, localized without radiation, she is and relating and normal footwear.  Past medical history:  Negative.  See flowsheet/record as well for more information.  Surgical history: Negative.  See flowsheet/record as well for more information.  Family history: Negative.  See flowsheet/record as well for more information.  Social history: Negative.  See flowsheet/record as well for more information.  Allergies, and medications have been entered into the medical record, reviewed, and no changes needed.   Review of Systems: No headache, visual changes, nausea, vomiting, diarrhea, constipation, dizziness, abdominal pain, skin rash, fevers, chills, night sweats, weight loss, swollen lymph nodes, body aches, joint swelling, muscle aches, chest pain, shortness of breath, mood changes, visual or auditory hallucinations.   Objective:   General: Well Developed, well nourished, and in no acute distress.  Neuro/Psych: Alert and oriented x3, extra-ocular muscles intact, able to move all 4 extremities, sensation grossly intact. Skin: Warm and dry, no rashes noted.  Respiratory: Not using accessory muscles, speaking in full sentences, trachea midline.  Cardiovascular: Pulses palpable, no extremity edema. Abdomen: Does not appear distended. Left Foot: Distal forefoot is swollen, bruised, tenderness over the second and third toes. Range of motion is full in all directions. Strength is 5/5 in all directions. No hallux valgus. No pes  cavus or pes planus. No abnormal callus noted. No pain over the navicular prominence, or base of fifth metatarsal. No tenderness to palpation of the calcaneal insertion of plantar fascia. No pain at the Achilles insertion. No pain over the calcaneal bursa. No pain of the retrocalcaneal bursa. No hallux rigidus or limitus. No tenderness palpation over interphalangeal joints. No pain with compression of the metatarsal heads. Neurovascularly intact distally.  Impression and Recommendations:   This case required medical decision making of moderate complexity.  Injury of left 2nd toe Now has fractures of the left second and third toes. Postop shoe, patient got planning of pain medication from the emergency department. Repeat x-rays. Return to see me in 3 weeks.  I billed a fracture code for this encounter, all subsequent visits will be post-op checks in the global period.

## 2016-06-02 NOTE — Assessment & Plan Note (Addendum)
Now has fractures of the left second and third toes. Postop shoe, patient got planning of pain medication from the emergency department. Repeat x-rays. Return to see me in 3 weeks. Due to multiple fractures we are going to proceed with DEXA scan.  I billed a fracture code for this encounter, all subsequent visits will be post-op checks in the global period.

## 2016-06-06 ENCOUNTER — Ambulatory Visit (INDEPENDENT_AMBULATORY_CARE_PROVIDER_SITE_OTHER): Payer: BLUE CROSS/BLUE SHIELD

## 2016-06-06 DIAGNOSIS — M85851 Other specified disorders of bone density and structure, right thigh: Secondary | ICD-10-CM

## 2016-06-06 DIAGNOSIS — M8588 Other specified disorders of bone density and structure, other site: Secondary | ICD-10-CM

## 2016-06-23 ENCOUNTER — Ambulatory Visit (INDEPENDENT_AMBULATORY_CARE_PROVIDER_SITE_OTHER): Payer: BLUE CROSS/BLUE SHIELD | Admitting: Sports Medicine

## 2016-06-23 ENCOUNTER — Encounter: Payer: Self-pay | Admitting: Sports Medicine

## 2016-06-23 DIAGNOSIS — M160 Bilateral primary osteoarthritis of hip: Secondary | ICD-10-CM

## 2016-06-23 DIAGNOSIS — S92912D Unspecified fracture of left toe(s), subsequent encounter for fracture with routine healing: Secondary | ICD-10-CM

## 2016-06-23 NOTE — Assessment & Plan Note (Signed)
Left hip injection last month, she can return in a month for her right hip be injected, it has not been injected for 5 months now. We did discuss bariatric surgery referral at the last visit, she thinks she missed her call, I'm going to place this again, she really needs to lose 100 pounds in the hopes of preventing progression hip arthroplasty.

## 2016-06-23 NOTE — Assessment & Plan Note (Signed)
Fractures of the left second and third toes, 3 weeks out, she has switched to a regular shoe herself, symptoms have improved significantly. The toenail of her third toe has separated however a new one will grow back. Return as needed.

## 2016-06-23 NOTE — Assessment & Plan Note (Signed)
We did discuss bariatric surgery referral at the last visit, she thinks she missed her call, I'm going to place this again, she really needs to lose 100 pounds in the hopes of preventing progression hip arthroplasty.

## 2016-06-23 NOTE — Progress Notes (Addendum)
  Subjective:    CC: Follow-up  HPI: 3 weeks post toe fractures, doing well. Still has a bit of swelling on the toes and avulsion of her left third toenail, no injury to the matrix so this will probably grow back.  Primary osteoarthritis of both hips:  Previous injection or 5 months ago for both hips, we did inject her left hip one month ago.  Obesity: Thinks she missed a phone call from bariatric surgery.  Past medical history:  Negative.  See flowsheet/record as well for more information.  Surgical history: Negative.  See flowsheet/record as well for more information.  Family history: Negative.  See flowsheet/record as well for more information.  Social history: Negative.  See flowsheet/record as well for more information.  Allergies, and medications have been entered into the medical record, reviewed, and no changes needed.   Review of Systems: No fevers, chills, night sweats, weight loss, chest pain, or shortness of breath.   Objective:    General: Well Developed, well nourished, and in no acute distress.  Neuro: Alert and oriented x3, extra-ocular muscles intact, sensation grossly intact.  HEENT: Normocephalic, atraumatic, pupils equal round reactive to light, neck supple, no masses, no lymphadenopathy, thyroid nonpalpable.  Skin: Warm and dry, no rashes. Cardiac: Regular rate and rhythm, no murmurs rubs or gallops, no lower extremity edema.  Respiratory: Clear to auscultation bilaterally. Not using accessory muscles, speaking in full sentences. Left Foot: Second and third toes are a bit swollen, there has been avulsion of the nail plate from the left third toe, no signs of bacterial superinfection. Fantastic range of motion in all joints and only minimal to no tenderness to palpation. Range of motion is full in all directions. Strength is 5/5 in all directions. No hallux valgus. No pes cavus or pes planus. No abnormal callus noted. No pain over the navicular prominence, or  base of fifth metatarsal. No tenderness to palpation of the calcaneal insertion of plantar fascia. No pain at the Achilles insertion. No pain over the calcaneal bursa. No pain of the retrocalcaneal bursa. No tenderness to palpation over the tarsals, metatarsals, or phalanges. No hallux rigidus or limitus. No tenderness palpation over interphalangeal joints. No pain with compression of the metatarsal heads. Neurovascularly intact distally.  Impression and Recommendations:    Closed fracture multiple phalanges, toe Fractures of the left second and third toes, 3 weeks out, she has switched to a regular shoe herself, symptoms have improved significantly. The toenail of her third toe has separated however a new one will grow back. Return as needed.  Osteoarthritis of both hips Left hip injection last month, she can return in a month for her right hip be injected, it has not been injected for 5 months now. We did discuss bariatric surgery referral at the last visit, she thinks she missed her call, I'm going to place this again, she really needs to lose 100 pounds in the hopes of preventing progression hip arthroplasty.  Severe obesity (BMI >= 40) We did discuss bariatric surgery referral at the last visit, she thinks she missed her call, I'm going to place this again, she really needs to lose 100 pounds in the hopes of preventing progression hip arthroplasty.  I spent 25 minutes with this patient, greater than 50% was face-to-face time counseling regarding the above diagnoses

## 2016-06-26 ENCOUNTER — Other Ambulatory Visit: Payer: Self-pay | Admitting: Sports Medicine

## 2016-08-10 DIAGNOSIS — E876 Hypokalemia: Secondary | ICD-10-CM | POA: Diagnosis not present

## 2016-08-10 DIAGNOSIS — B9689 Other specified bacterial agents as the cause of diseases classified elsewhere: Secondary | ICD-10-CM | POA: Diagnosis not present

## 2016-08-10 DIAGNOSIS — Z883 Allergy status to other anti-infective agents status: Secondary | ICD-10-CM | POA: Diagnosis not present

## 2016-08-10 DIAGNOSIS — G629 Polyneuropathy, unspecified: Secondary | ICD-10-CM | POA: Diagnosis not present

## 2016-08-10 DIAGNOSIS — Z87891 Personal history of nicotine dependence: Secondary | ICD-10-CM | POA: Diagnosis not present

## 2016-08-10 DIAGNOSIS — N390 Urinary tract infection, site not specified: Secondary | ICD-10-CM | POA: Diagnosis not present

## 2016-08-10 DIAGNOSIS — Z888 Allergy status to other drugs, medicaments and biological substances status: Secondary | ICD-10-CM | POA: Diagnosis not present

## 2016-08-10 DIAGNOSIS — M797 Fibromyalgia: Secondary | ICD-10-CM | POA: Diagnosis not present

## 2016-08-10 DIAGNOSIS — M1990 Unspecified osteoarthritis, unspecified site: Secondary | ICD-10-CM | POA: Diagnosis not present

## 2016-08-10 DIAGNOSIS — Z885 Allergy status to narcotic agent status: Secondary | ICD-10-CM | POA: Diagnosis not present

## 2016-08-10 DIAGNOSIS — E860 Dehydration: Secondary | ICD-10-CM | POA: Diagnosis not present

## 2016-08-10 DIAGNOSIS — I10 Essential (primary) hypertension: Secondary | ICD-10-CM | POA: Diagnosis not present

## 2016-08-10 DIAGNOSIS — R252 Cramp and spasm: Secondary | ICD-10-CM | POA: Diagnosis not present

## 2016-08-10 DIAGNOSIS — J45909 Unspecified asthma, uncomplicated: Secondary | ICD-10-CM | POA: Diagnosis not present

## 2016-08-10 DIAGNOSIS — M791 Myalgia: Secondary | ICD-10-CM | POA: Diagnosis not present

## 2016-08-10 DIAGNOSIS — Z791 Long term (current) use of non-steroidal anti-inflammatories (NSAID): Secondary | ICD-10-CM | POA: Diagnosis not present

## 2016-08-10 DIAGNOSIS — Z79899 Other long term (current) drug therapy: Secondary | ICD-10-CM | POA: Diagnosis not present

## 2016-08-10 DIAGNOSIS — R197 Diarrhea, unspecified: Secondary | ICD-10-CM | POA: Diagnosis not present

## 2016-08-13 ENCOUNTER — Encounter: Payer: Self-pay | Admitting: Physician Assistant

## 2016-08-13 ENCOUNTER — Ambulatory Visit (INDEPENDENT_AMBULATORY_CARE_PROVIDER_SITE_OTHER): Payer: BLUE CROSS/BLUE SHIELD | Admitting: Physician Assistant

## 2016-08-13 VITALS — BP 124/85 | HR 106 | Temp 98.2°F | Ht 69.0 in | Wt 249.0 lb

## 2016-08-13 DIAGNOSIS — R1115 Cyclical vomiting syndrome unrelated to migraine: Secondary | ICD-10-CM

## 2016-08-13 DIAGNOSIS — G43A0 Cyclical vomiting, not intractable: Secondary | ICD-10-CM

## 2016-08-13 DIAGNOSIS — F4321 Adjustment disorder with depressed mood: Secondary | ICD-10-CM

## 2016-08-13 DIAGNOSIS — F4320 Adjustment disorder, unspecified: Secondary | ICD-10-CM | POA: Diagnosis not present

## 2016-08-13 DIAGNOSIS — F322 Major depressive disorder, single episode, severe without psychotic features: Secondary | ICD-10-CM | POA: Diagnosis not present

## 2016-08-13 DIAGNOSIS — N3001 Acute cystitis with hematuria: Secondary | ICD-10-CM

## 2016-08-13 DIAGNOSIS — E876 Hypokalemia: Secondary | ICD-10-CM | POA: Diagnosis not present

## 2016-08-13 DIAGNOSIS — F419 Anxiety disorder, unspecified: Secondary | ICD-10-CM

## 2016-08-13 LAB — BASIC METABOLIC PANEL
BUN: 9 mg/dL (ref 7–25)
CALCIUM: 9.3 mg/dL (ref 8.6–10.2)
CHLORIDE: 110 mmol/L (ref 98–110)
CO2: 16 mmol/L — AB (ref 20–31)
CREATININE: 0.91 mg/dL (ref 0.50–1.10)
Glucose, Bld: 87 mg/dL (ref 65–99)
Potassium: 3.6 mmol/L (ref 3.5–5.3)
SODIUM: 140 mmol/L (ref 135–146)

## 2016-08-13 MED ORDER — ARIPIPRAZOLE 2 MG PO TABS
2.0000 mg | ORAL_TABLET | Freq: Every day | ORAL | 1 refills | Status: DC
Start: 2016-08-13 — End: 2016-09-01

## 2016-08-13 MED ORDER — LORAZEPAM 0.5 MG PO TABS
0.5000 mg | ORAL_TABLET | Freq: Three times a day (TID) | ORAL | 1 refills | Status: DC | PRN
Start: 2016-08-13 — End: 2019-12-09

## 2016-08-13 NOTE — Progress Notes (Signed)
   Subjective:    Patient ID: Kathryn Cline, female    DOB: 1976-04-27, 41 y.o.   MRN: [id number]  HPI Pt is a 41 yo female who presents to the clinic from ED follow up. She went to ED on 08/10/16 with 3 days of nausea, diarrhea, leg cramps. She was Dx with UTI and dehydration with low potassium. She was given keflex, zofran. Her potassium 3.4. SCr 1.35.   She comes in today on keflex.she denies any abdominal or flank pain/dysuria/urinary frequency issues. She continues to be very nauseated and continues to vomit some. No other medication changes. She does expresses she is grieving over her niece of 58 years old that died during childbirth. She just can't stop crying. Our patient helped raise this girl. Her husband is not letting any of her family see the baby and that is making it harder. She just doesn't under stand why this is happening.    Review of Systems    see HPI.  Objective:   Physical Exam  Constitutional: She is oriented to person, place, and time. She appears well-developed and well-nourished.  HENT:  Head: Normocephalic and atraumatic.  Cardiovascular: Normal rate, regular rhythm and normal heart sounds.   Pulmonary/Chest: Effort normal and breath sounds normal.  No CVA tenderness.   Abdominal: Soft. Bowel sounds are normal. She exhibits no distension and no mass. There is no tenderness. There is no rebound and no guarding.  Neurological: She is alert and oriented to person, place, and time.  Psychiatric: She has a normal mood and affect. Her behavior is normal.          Assessment & Plan:  Marland KitchenMarland KitchenTenesha was seen today for hospitalization follow-up, nausea and urinary tract infection.  Diagnoses and all orders for this visit:  Acute cystitis with hematuria  Hypokalemia -     Basic metabolic panel  Non-intractable cyclical vomiting with nausea  Severe single current episode of major depressive disorder, without psychotic features (HCC) -     ARIPiprazole (ABILIFY)  2 MG tablet; Take 1 tablet (2 mg total) by mouth daily.  Grieving -     ARIPiprazole (ABILIFY) 2 MG tablet; Take 1 tablet (2 mg total) by mouth daily.  Anxiety -     LORazepam (ATIVAN) 0.5 MG tablet; Take 1 tablet (0.5 mg total) by mouth every 8 (eight) hours as needed for anxiety.  finish keflex for UTI. Give sample for clearance once finish.  Pt has anti-nausea medication. I suspect her mood is making her nauseated.  Will recheck potassium.  Discussed hospice for grief counseling.  Added abilify to depression medications. Discussed side effects.   Ativan refilled for as needed usage.  Follow up in 2 weeks.   Spent 30 minutes with patient over 50 percent of visit spent counseling and discussing treatment plan.

## 2016-08-13 NOTE — Patient Instructions (Addendum)
Added Abilify.  Encouraged Grief counseling.

## 2016-08-15 DIAGNOSIS — F4321 Adjustment disorder with depressed mood: Secondary | ICD-10-CM | POA: Insufficient documentation

## 2016-08-18 ENCOUNTER — Ambulatory Visit (INDEPENDENT_AMBULATORY_CARE_PROVIDER_SITE_OTHER): Payer: BLUE CROSS/BLUE SHIELD | Admitting: Sports Medicine

## 2016-08-18 ENCOUNTER — Encounter: Payer: Self-pay | Admitting: Sports Medicine

## 2016-08-18 DIAGNOSIS — M47817 Spondylosis without myelopathy or radiculopathy, lumbosacral region: Secondary | ICD-10-CM

## 2016-08-18 DIAGNOSIS — M160 Bilateral primary osteoarthritis of hip: Secondary | ICD-10-CM | POA: Diagnosis not present

## 2016-08-18 NOTE — Assessment & Plan Note (Signed)
Bilateral hip joint injections. Again, she needs to lose about 100 pounds. I would like her to discuss bariatric surgery with her PCP.

## 2016-08-18 NOTE — Assessment & Plan Note (Signed)
Please see another referral to bariatric surgery. Needs to lose 100 pounds in the hopes of preventing hip arthroplasty.

## 2016-08-18 NOTE — Assessment & Plan Note (Signed)
Typically gets bilateral L3-S1 facet joint injections at [facility], I'm happy to take over ordering these.

## 2016-08-18 NOTE — Progress Notes (Signed)
  Subjective:    CC: bilateral back and leg pain  HPI: Kathryn Cline is a 41 y.o. Female with a history of L3-S1 radiculopathy, bilateral hip osteoarthritis, and fibromyalgia who presents with bilateral back pain that radiates down the leg. She recently had a family member pass away which she says may be contributing to her overall level of pain. She does report some hip pain as well and last had steroid injection 05/08/16. Pt also complains of foot swelling since starting Abilify, which she will follow-up with PCP about.  Past medical history:  Negative.  See flowsheet/record as well for more information.  Surgical history: Negative.  See flowsheet/record as well for more information.  Family history: Negative.  See flowsheet/record as well for more information.  Social history: Negative.  See flowsheet/record as well for more information.  Allergies, and medications have been entered into the medical record, reviewed, and no changes needed.   Review of Systems: No fevers, chills, night sweats, weight loss, chest pain, or shortness of breath.   Objective:    General: Well Developed, well nourished, and in no acute distress.  Neuro: Alert and oriented x3, extra-ocular muscles intact, sensation grossly intact.  HEENT: Normocephalic, atraumatic, pupils equal round reactive to light, neck supple, no masses, no lymphadenopathy, thyroid nonpalpable.  Skin: Warm and dry, no rashes. Cardiac: Regular rate and rhythm, no murmurs rubs or gallops, no lower extremity edema. an Respiratory: Clear to auscultation bilaterally. Not using accessory muscles, speaking in full sentences. MSK: Strength and ROM intact in all planes for knee and ankle joints, hip flexion, extension, abduction, adduction. Pt does report pain on internal rotation of hips bilaterally. Sensation decreased bilaterally in feet. Patellar reflexes 1+ bilaterally.    Impression and Recommendations:    Kathryn Cline is a 41 y.o. Female with a  history of L3-S1 radiculopathy and bilateral hip osteoarthritis presents today for bilateral hip osteoarthritis exacerbation. Bilateral hip joint injections placed. Pt referred to bariatric surgery.   Pt is interested in continuing bilateral L3-S1 facet joint injections, and will follow-up for scheduling this after researching nearby providers.

## 2016-08-25 ENCOUNTER — Other Ambulatory Visit: Payer: Self-pay | Admitting: Physician Assistant

## 2016-09-01 ENCOUNTER — Encounter: Payer: Self-pay | Admitting: Physician Assistant

## 2016-09-01 ENCOUNTER — Ambulatory Visit (INDEPENDENT_AMBULATORY_CARE_PROVIDER_SITE_OTHER): Payer: BLUE CROSS/BLUE SHIELD | Admitting: Physician Assistant

## 2016-09-01 VITALS — BP 139/85 | HR 99 | Ht 69.0 in | Wt 255.0 lb

## 2016-09-01 DIAGNOSIS — M79671 Pain in right foot: Secondary | ICD-10-CM | POA: Diagnosis not present

## 2016-09-01 DIAGNOSIS — M160 Bilateral primary osteoarthritis of hip: Secondary | ICD-10-CM

## 2016-09-01 DIAGNOSIS — F4321 Adjustment disorder with depressed mood: Secondary | ICD-10-CM

## 2016-09-01 DIAGNOSIS — M47817 Spondylosis without myelopathy or radiculopathy, lumbosacral region: Secondary | ICD-10-CM | POA: Diagnosis not present

## 2016-09-01 DIAGNOSIS — F4320 Adjustment disorder, unspecified: Secondary | ICD-10-CM

## 2016-09-01 DIAGNOSIS — F332 Major depressive disorder, recurrent severe without psychotic features: Secondary | ICD-10-CM | POA: Diagnosis not present

## 2016-09-01 DIAGNOSIS — G894 Chronic pain syndrome: Secondary | ICD-10-CM

## 2016-09-01 DIAGNOSIS — N6310 Unspecified lump in the right breast, unspecified quadrant: Secondary | ICD-10-CM | POA: Diagnosis not present

## 2016-09-01 DIAGNOSIS — M79672 Pain in left foot: Secondary | ICD-10-CM | POA: Diagnosis not present

## 2016-09-01 MED ORDER — ARIPIPRAZOLE 5 MG PO TABS: 5.0000 mg | ORAL_TABLET | Freq: Every day | ORAL | 1 refills | Status: DC

## 2016-09-01 MED ORDER — MELOXICAM 7.5 MG PO TABS: 7.5000 mg | ORAL_TABLET | Freq: Every day | ORAL | 3 refills | Status: DC

## 2016-09-01 NOTE — Patient Instructions (Signed)
Increasing abilify to 71m daily with cymbalta.

## 2016-09-01 NOTE — Progress Notes (Signed)
   Subjective:    Patient ID: Kathryn Cline, female    DOB: 1975/08/11, 41 y.o.   MRN: [id number]  HPI  Pt is a 41 yo female who presents to the clinic to follow up on MDD and grieving. She recently lost her niece during childbirth and now the husband has moved on with another woman and won't let them see the baby. I started Abilify at last visit. She is doing better. She is talking with a grief counselor who is also her landlord. She is in the process of writing a letter to Kathryn Cline to let her known how she feels. She is planning a benefit with proceeds going to brother to get a lawyer to fight for full custody. She is often tearful and continues to battling feelings of saddness. She does think she is better. She has seen Kathryn Cline 3 times and feels like if she can start to see him more it would help her emotions.   Her pain as worsened. Pt pt pain clinic doctor has moved to [facility] and she cannot drive out that way. She would like referral to pain clinic here in [place].    She has also noticed a right breast lump for last month. Does not seem to be getting bigger. It is not painful. Denies any nipple retraction/discharge or breast rash.      Review of Systems    see HPI.  Objective:   Physical Exam  Constitutional: She is oriented to person, place, and time. She appears well-developed and well-nourished.  Cardiovascular: Normal rate, regular rhythm and normal heart sounds.   Pulmonary/Chest: Effort normal and breath sounds normal.  Right breast firm nodule that is deep to palpation at 1 oclock approximately the size of a marble. No erythema or nipple inversion.   Neurological: She is alert and oriented to person, place, and time.  Psychiatric: She has a normal mood and affect. Her behavior is normal.          Assessment & Plan:  Marland KitchenMarland KitchenAmillia was seen today for anxiety and depression.  Diagnoses and all orders for this visit:  Severe episode of recurrent major depressive  disorder, without psychotic features (HCC) -     ARIPiprazole (ABILIFY) 5 MG tablet; Take 1 tablet (5 mg total) by mouth daily.  Grieving  Breast mass, right  Bilateral foot pain -     meloxicam (MOBIC) 7.5 MG tablet; Take 1 tablet (7.5 mg total) by mouth daily.   .. Depression screen PHQ 2/9 09/01/2016  Decreased Interest 1  Down, Depressed, Hopeless 1  PHQ - 2 Score 2  Altered sleeping 0  Tired, decreased energy 2  Change in appetite 2  Feeling bad or failure about yourself  2  Trouble concentrating 0  Moving slowly or fidgety/restless 2  Suicidal thoughts 0  PHQ-9 Score 10  Some encounter information is confidential and restricted. Go to Review Flowsheets activity to see all data.   .. GAD 7 : Generalized Anxiety Score 09/01/2016  Nervous, Anxious, on Edge 3  Control/stop worrying 3  Worry too much - different things 2  Trouble relaxing 3  Restless 0  Easily annoyed or irritable 3  Afraid - awful might happen 1  Total GAD 7 Score 15    Continue talking with grief counselor.  Increased abilify. Follow up in 4 weeks.   mobic given for foot pain.   Will make pain clinic referral.   Will get imaging for breast lump.

## 2016-09-06 ENCOUNTER — Other Ambulatory Visit: Payer: Self-pay | Admitting: Physician Assistant

## 2016-09-17 ENCOUNTER — Ambulatory Visit
Admission: RE | Admit: 2016-09-17 | Discharge: 2016-09-17 | Disposition: A | Discharge status: Home / Self Care | Payer: BLUE CROSS/BLUE SHIELD | Source: Ambulatory Visit | Attending: Physician Assistant | Admitting: Physician Assistant

## 2016-09-17 DIAGNOSIS — R928 Other abnormal and inconclusive findings on diagnostic imaging of breast: Secondary | ICD-10-CM | POA: Diagnosis not present

## 2016-09-17 DIAGNOSIS — N6310 Unspecified lump in the right breast, unspecified quadrant: Secondary | ICD-10-CM

## 2016-09-17 DIAGNOSIS — N6489 Other specified disorders of breast: Secondary | ICD-10-CM | POA: Diagnosis not present

## 2016-09-18 ENCOUNTER — Other Ambulatory Visit (HOSPITAL_COMMUNITY): Payer: Self-pay | Admitting: Medical

## 2016-09-18 ENCOUNTER — Telehealth (HOSPITAL_COMMUNITY): Payer: Self-pay | Admitting: Medical

## 2016-09-18 DIAGNOSIS — F4312 Post-traumatic stress disorder, chronic: Secondary | ICD-10-CM

## 2016-09-18 DIAGNOSIS — F332 Major depressive disorder, recurrent severe without psychotic features: Secondary | ICD-10-CM

## 2016-09-18 DIAGNOSIS — M797 Fibromyalgia: Secondary | ICD-10-CM

## 2016-09-18 MED ORDER — DULOXETINE HCL 60 MG PO CPEP: 60.0000 mg | ORAL_CAPSULE | Freq: Every day | ORAL | 1 refills | Status: DC

## 2016-09-18 MED ORDER — TRAZODONE HCL 50 MG PO TABS: 50.0000 mg | ORAL_TABLET | Freq: Every evening | ORAL | 0 refills | Status: DC | PRN

## 2016-09-18 NOTE — Telephone Encounter (Signed)
Please call pt and tell her I cannot see her next week due to the fact she haas been getting Psych meds from [facility] and she needs to return there for medications or have NP [name] refer her to [place] as renew pt Thank You

## 2016-09-18 NOTE — Telephone Encounter (Signed)
Lm with pt's mother to return call to office. Per Darlyne Russian, please inform pt a 10 day supply of Trazodone was sent to pharmacy. Pt's next apt is schedule on 09/25/16. Nothing further is need at this time.

## 2016-09-18 NOTE — Telephone Encounter (Signed)
rx sent 30 day

## 2016-09-18 NOTE — Telephone Encounter (Signed)
Pt return call to office. Pt states she is out of Cymbalta. Called [facility], spoke w/ Hassan Rowan (pharmacist). Per Hassan Rowan, Cymbalta rx was last filled on 02/26/16, no refills on file. Pt will need a 10 day supply sent to pharmacy. Please advise.

## 2016-09-18 NOTE — Telephone Encounter (Signed)
Patient walked in to be seen and get refills. Patient was last seen in October.  Per Kathryn Cline, patient can be seen next week not today.  Can we please send a week supply to pharmacy due to her being completley out of meds

## 2016-09-18 NOTE — Progress Notes (Signed)
Rx request-pt absent from clinic over 6 months-requested referral from PCP Sent RX for Trazodone-Cymbalta was pended-PCP gave Rx for 1 month of Abilify

## 2016-09-18 NOTE — Progress Notes (Signed)
Cymbalta rx needed

## 2016-09-18 NOTE — Telephone Encounter (Signed)
Received referral from [facility], Attached to appt.

## 2016-09-19 NOTE — Telephone Encounter (Signed)
Thank you :)

## 2016-09-25 ENCOUNTER — Ambulatory Visit (INDEPENDENT_AMBULATORY_CARE_PROVIDER_SITE_OTHER): Payer: BLUE CROSS/BLUE SHIELD | Admitting: Medical

## 2016-09-25 ENCOUNTER — Encounter (HOSPITAL_COMMUNITY): Payer: Self-pay | Admitting: Medical

## 2016-09-25 VITALS — BP 139/85 | HR 88 | Ht 69.0 in | Wt 258.8 lb

## 2016-09-25 DIAGNOSIS — M797 Fibromyalgia: Secondary | ICD-10-CM

## 2016-09-25 DIAGNOSIS — M1610 Unilateral primary osteoarthritis, unspecified hip: Secondary | ICD-10-CM | POA: Diagnosis not present

## 2016-09-25 DIAGNOSIS — Z62819 Personal history of unspecified abuse in childhood: Secondary | ICD-10-CM

## 2016-09-25 DIAGNOSIS — M51369 Other intervertebral disc degeneration, lumbar region without mention of lumbar back pain or lower extremity pain: Secondary | ICD-10-CM

## 2016-09-25 DIAGNOSIS — F4312 Post-traumatic stress disorder, chronic: Secondary | ICD-10-CM | POA: Diagnosis not present

## 2016-09-25 DIAGNOSIS — M5136 Other intervertebral disc degeneration, lumbar region: Secondary | ICD-10-CM

## 2016-09-25 DIAGNOSIS — Z6837 Body mass index (BMI) 37.0-37.9, adult: Secondary | ICD-10-CM

## 2016-09-25 DIAGNOSIS — G894 Chronic pain syndrome: Secondary | ICD-10-CM

## 2016-09-25 DIAGNOSIS — E66812 Obesity, class 2: Secondary | ICD-10-CM

## 2016-09-25 DIAGNOSIS — F341 Dysthymic disorder: Secondary | ICD-10-CM | POA: Diagnosis not present

## 2016-09-25 MED ORDER — FLUOXETINE HCL 10 MG PO CAPS: 10.0000 mg | ORAL_CAPSULE | Freq: Every day | ORAL | 2 refills | Status: DC

## 2016-09-25 MED ORDER — DULOXETINE HCL 60 MG PO CPEP: 60.0000 mg | ORAL_CAPSULE | Freq: Every day | ORAL | 1 refills | Status: DC

## 2016-09-25 MED ORDER — TRAZODONE HCL 50 MG PO TABS: ORAL_TABLET | ORAL | 2 refills | Status: DC
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
## 2016-10-01 ENCOUNTER — Encounter: Payer: Self-pay | Admitting: Physician Assistant

## 2016-10-01 ENCOUNTER — Ambulatory Visit (INDEPENDENT_AMBULATORY_CARE_PROVIDER_SITE_OTHER): Payer: BLUE CROSS/BLUE SHIELD | Admitting: Physician Assistant

## 2016-10-01 VITALS — BP 115/68 | HR 89 | Ht 69.0 in | Wt 260.0 lb

## 2016-10-01 DIAGNOSIS — M8580 Other specified disorders of bone density and structure, unspecified site: Secondary | ICD-10-CM

## 2016-10-01 DIAGNOSIS — R600 Localized edema: Secondary | ICD-10-CM

## 2016-10-01 DIAGNOSIS — D239 Other benign neoplasm of skin, unspecified: Secondary | ICD-10-CM | POA: Diagnosis not present

## 2016-10-01 DIAGNOSIS — I10 Essential (primary) hypertension: Secondary | ICD-10-CM

## 2016-10-01 DIAGNOSIS — E559 Vitamin D deficiency, unspecified: Secondary | ICD-10-CM

## 2016-10-01 MED ORDER — VITAMIN D (ERGOCALCIFEROL) 1.25 MG (50000 UNIT) PO CAPS: 50000.0000 [IU] | ORAL_CAPSULE | ORAL | 1 refills | Status: DC

## 2016-10-01 MED ORDER — FUROSEMIDE 40 MG PO TABS: 40.0000 mg | ORAL_TABLET | Freq: Every day | ORAL | 1 refills | Status: DC
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
## 2016-10-03 ENCOUNTER — Encounter: Payer: Self-pay | Admitting: Physician Assistant

## 2016-10-12 ENCOUNTER — Other Ambulatory Visit: Payer: Self-pay | Admitting: Physician Assistant

## 2016-10-12 DIAGNOSIS — F322 Major depressive disorder, single episode, severe without psychotic features: Secondary | ICD-10-CM

## 2016-10-12 DIAGNOSIS — F4321 Adjustment disorder with depressed mood: Secondary | ICD-10-CM

## 2016-10-23 ENCOUNTER — Encounter (HOSPITAL_COMMUNITY): Payer: Self-pay | Admitting: Medical

## 2016-10-23 ENCOUNTER — Ambulatory Visit (INDEPENDENT_AMBULATORY_CARE_PROVIDER_SITE_OTHER): Payer: BLUE CROSS/BLUE SHIELD | Admitting: Medical

## 2016-10-23 VITALS — BP 128/76 | HR 83 | Resp 16 | Ht 69.0 in | Wt 263.0 lb

## 2016-10-23 DIAGNOSIS — Z791 Long term (current) use of non-steroidal anti-inflammatories (NSAID): Secondary | ICD-10-CM | POA: Diagnosis not present

## 2016-10-23 DIAGNOSIS — Z79899 Other long term (current) drug therapy: Secondary | ICD-10-CM

## 2016-10-23 DIAGNOSIS — Z79891 Long term (current) use of opiate analgesic: Secondary | ICD-10-CM

## 2016-10-23 DIAGNOSIS — F4329 Adjustment disorder with other symptoms: Secondary | ICD-10-CM

## 2016-10-23 DIAGNOSIS — Z881 Allergy status to other antibiotic agents status: Secondary | ICD-10-CM | POA: Diagnosis not present

## 2016-10-23 DIAGNOSIS — Z87891 Personal history of nicotine dependence: Secondary | ICD-10-CM

## 2016-10-23 DIAGNOSIS — G4700 Insomnia, unspecified: Secondary | ICD-10-CM

## 2016-10-23 DIAGNOSIS — F341 Dysthymic disorder: Secondary | ICD-10-CM | POA: Diagnosis not present

## 2016-10-23 DIAGNOSIS — G894 Chronic pain syndrome: Secondary | ICD-10-CM

## 2016-10-23 DIAGNOSIS — Z6837 Body mass index (BMI) 37.0-37.9, adult: Secondary | ICD-10-CM

## 2016-10-23 DIAGNOSIS — F4312 Post-traumatic stress disorder, chronic: Secondary | ICD-10-CM

## 2016-10-23 DIAGNOSIS — Z886 Allergy status to analgesic agent status: Secondary | ICD-10-CM | POA: Diagnosis not present

## 2016-10-23 DIAGNOSIS — F329 Major depressive disorder, single episode, unspecified: Secondary | ICD-10-CM

## 2016-10-23 DIAGNOSIS — Z7982 Long term (current) use of aspirin: Secondary | ICD-10-CM

## 2016-10-23 DIAGNOSIS — Z888 Allergy status to other drugs, medicaments and biological substances status: Secondary | ICD-10-CM | POA: Diagnosis not present

## 2016-10-23 DIAGNOSIS — Z62819 Personal history of unspecified abuse in childhood: Secondary | ICD-10-CM

## 2016-10-23 DIAGNOSIS — Z6379 Other stressful life events affecting family and household: Secondary | ICD-10-CM

## 2016-10-23 MED ORDER — FLUOXETINE HCL 20 MG PO CAPS: 20.0000 mg | ORAL_CAPSULE | Freq: Every day | ORAL | 2 refills | Status: DC
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
## 2016-11-03 ENCOUNTER — Other Ambulatory Visit: Payer: Self-pay | Admitting: Physician Assistant

## 2016-11-03 DIAGNOSIS — F332 Major depressive disorder, recurrent severe without psychotic features: Secondary | ICD-10-CM

## 2016-11-20 ENCOUNTER — Ambulatory Visit (HOSPITAL_COMMUNITY): Payer: Self-pay | Admitting: Medical

## 2016-11-27 ENCOUNTER — Ambulatory Visit (INDEPENDENT_AMBULATORY_CARE_PROVIDER_SITE_OTHER): Payer: BLUE CROSS/BLUE SHIELD | Admitting: Medical

## 2016-11-27 ENCOUNTER — Encounter (HOSPITAL_COMMUNITY): Payer: Self-pay | Admitting: Medical

## 2016-11-27 VITALS — BP 128/80 | HR 104 | Resp 18 | Ht 69.0 in | Wt 268.0 lb

## 2016-11-27 DIAGNOSIS — M1610 Unilateral primary osteoarthritis, unspecified hip: Secondary | ICD-10-CM

## 2016-11-27 DIAGNOSIS — F17200 Nicotine dependence, unspecified, uncomplicated: Secondary | ICD-10-CM | POA: Diagnosis not present

## 2016-11-27 DIAGNOSIS — F4312 Post-traumatic stress disorder, chronic: Secondary | ICD-10-CM | POA: Diagnosis not present

## 2016-11-27 DIAGNOSIS — F5101 Primary insomnia: Secondary | ICD-10-CM

## 2016-11-27 DIAGNOSIS — M5136 Other intervertebral disc degeneration, lumbar region: Secondary | ICD-10-CM

## 2016-11-27 DIAGNOSIS — G894 Chronic pain syndrome: Secondary | ICD-10-CM | POA: Diagnosis not present

## 2016-11-27 DIAGNOSIS — Z62819 Personal history of unspecified abuse in childhood: Secondary | ICD-10-CM

## 2016-11-27 DIAGNOSIS — M797 Fibromyalgia: Secondary | ICD-10-CM

## 2016-11-27 DIAGNOSIS — Z639 Problem related to primary support group, unspecified: Secondary | ICD-10-CM | POA: Diagnosis not present

## 2016-11-27 MED ORDER — FLUOXETINE HCL 20 MG PO CAPS: 20.0000 mg | ORAL_CAPSULE | Freq: Every day | ORAL | 2 refills | Status: DC

## 2016-11-27 MED ORDER — TRAZODONE HCL 50 MG PO TABS: ORAL_TABLET | ORAL | 2 refills | Status: DC
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
## 2016-12-04 ENCOUNTER — Other Ambulatory Visit: Payer: Self-pay

## 2016-12-04 DIAGNOSIS — M79671 Pain in right foot: Secondary | ICD-10-CM

## 2016-12-04 DIAGNOSIS — M79672 Pain in left foot: Principal | ICD-10-CM

## 2016-12-04 MED ORDER — MELOXICAM 7.5 MG PO TABS: 7.5000 mg | ORAL_TABLET | Freq: Every day | ORAL | 3 refills | Status: DC

## 2016-12-18 ENCOUNTER — Encounter: Payer: Self-pay | Admitting: Sports Medicine

## 2016-12-18 ENCOUNTER — Ambulatory Visit (INDEPENDENT_AMBULATORY_CARE_PROVIDER_SITE_OTHER): Payer: BLUE CROSS/BLUE SHIELD | Admitting: Sports Medicine

## 2016-12-18 DIAGNOSIS — M160 Bilateral primary osteoarthritis of hip: Secondary | ICD-10-CM

## 2016-12-18 MED ORDER — CYCLOBENZAPRINE HCL 10 MG PO TABS
10.0000 mg | ORAL_TABLET | Freq: Three times a day (TID) | ORAL | 1 refills | Status: DC | PRN
Start: 2016-12-18 — End: 2017-03-03

## 2016-12-18 NOTE — Assessment & Plan Note (Signed)
For some reason didn't get a call from bariatrics, placing the referral again.

## 2016-12-18 NOTE — Progress Notes (Signed)
Subjective:    CC: Bilateral hip pain  HPI: Kathryn Cline returns, she is a pleasant 41 year old female, she has bilateral hip osteoarthritis, last injected 4 months ago with good relief. She now having recurrence of pain, moderate, persistent, localized in the groin without radiation, desires repeat interventional treatment today.  Past medical history:  Negative.  See flowsheet/record as well for more information.  Surgical history: Negative.  See flowsheet/record as well for more information.  Family history: Negative.  See flowsheet/record as well for more information.  Social history: Negative.  See flowsheet/record as well for more information.  Allergies, and medications have been entered into the medical record, reviewed, and no changes needed.   Review of Systems: No fevers, chills, night sweats, weight loss, chest pain, or shortness of breath.   Objective:    General: Well Developed, well nourished, and in no acute distress.  Neuro: Alert and oriented x3, extra-ocular muscles intact, sensation grossly intact.  HEENT: Normocephalic, atraumatic, pupils equal round reactive to light, neck supple, no masses, no lymphadenopathy, thyroid nonpalpable.  Skin: Warm and dry, no rashes. Cardiac: Regular rate and rhythm, no murmurs rubs or gallops, no lower extremity edema.  Respiratory: Clear to auscultation bilaterally. Not using accessory muscles, speaking in full sentences. Bilateral hips: ROM IR: 30 with pain, ER: 60 Deg, Flexion: 120 Deg, Extension: 100 Deg, Abduction: 45 Deg, Adduction: 45 Deg Strength IR: 5/5, ER: 5/5, Flexion: 5/5, Extension: 5/5, Abduction: 5/5, Adduction: 5/5 Pelvic alignment unremarkable to inspection and palpation. Standing hip rotation and gait without trendelenburg / unsteadiness. Greater trochanter without tenderness to palpation. No tenderness over piriformis. No SI joint tenderness and normal minimal SI movement.  Procedure: Real-time Ultrasound Guided  Injection of left hip joint Device: GE Logiq E  Verbal informed consent obtained.  Time-out conducted.  Noted no overlying erythema, induration, or other signs of local infection.  Skin prepped in a sterile fashion.  Local anesthesia: Topical Ethyl chloride.  With sterile technique and under real time ultrasound guidance:  22-gauge spinal needle advanced to the femoral head/neck junction, bone contacted ensuring that I was within the capsule and I then injected 1 mL Kenalog 40, 2 mL lidocaine, 2 mL bupivacaine. Completed without difficulty  Pain immediately resolved suggesting accurate placement of the medication.  Advised to call if fevers/chills, erythema, induration, drainage, or persistent bleeding.  Images permanently stored and available for review in the ultrasound unit.  Impression: Technically successful ultrasound guided injection.  Procedure: Real-time Ultrasound Guided Injection of right hip joint Device: GE Logiq E  Verbal informed consent obtained.  Time-out conducted.  Noted no overlying erythema, induration, or other signs of local infection.  Skin prepped in a sterile fashion.  Local anesthesia: Topical Ethyl chloride.  With sterile technique and under real time ultrasound guidance:  22-gauge spinal needle advanced to the femoral head/neck junction, bone contacted ensuring that I was within the capsule and I then injected 1 mL Kenalog 40, 2 mL lidocaine, 2 mL bupivacaine. Completed without difficulty  Pain immediately resolved suggesting accurate placement of the medication.  Advised to call if fevers/chills, erythema, induration, drainage, or persistent bleeding.  Images permanently stored and available for review in the ultrasound unit.  Impression: Technically successful ultrasound guided injection.  Impression and Recommendations:    Osteoarthritis of both hips Previous injection was in April, repeat bilateral hip joint injection. Needs to lose some weight, we  will try this before going for hip replacement.  Severe obesity (BMI >= 40) For some reason didn't  get a call from bariatrics, placing the referral again.

## 2016-12-18 NOTE — Assessment & Plan Note (Signed)
Previous injection was in April, repeat bilateral hip joint injection. Needs to lose some weight, we will try this before going for hip replacement.

## 2016-12-25 ENCOUNTER — Encounter (HOSPITAL_COMMUNITY): Payer: Self-pay | Admitting: Medical

## 2016-12-25 ENCOUNTER — Ambulatory Visit (INDEPENDENT_AMBULATORY_CARE_PROVIDER_SITE_OTHER): Payer: BLUE CROSS/BLUE SHIELD | Admitting: Medical

## 2016-12-25 DIAGNOSIS — G894 Chronic pain syndrome: Secondary | ICD-10-CM

## 2016-12-25 DIAGNOSIS — F341 Dysthymic disorder: Secondary | ICD-10-CM

## 2016-12-25 DIAGNOSIS — Z62810 Personal history of physical and sexual abuse in childhood: Secondary | ICD-10-CM

## 2016-12-25 DIAGNOSIS — F4312 Post-traumatic stress disorder, chronic: Secondary | ICD-10-CM | POA: Diagnosis not present

## 2016-12-25 DIAGNOSIS — Z6838 Body mass index (BMI) 38.0-38.9, adult: Secondary | ICD-10-CM

## 2016-12-25 MED ORDER — LAMOTRIGINE 25 MG PO TABS: ORAL_TABLET | ORAL | 0 refills | Status: DC
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
## 2016-12-30 ENCOUNTER — Other Ambulatory Visit: Payer: Self-pay | Admitting: Sports Medicine

## 2016-12-30 ENCOUNTER — Other Ambulatory Visit (HOSPITAL_COMMUNITY): Payer: Self-pay | Admitting: Medical

## 2017-01-19 ENCOUNTER — Other Ambulatory Visit: Payer: Self-pay | Admitting: Sports Medicine

## 2017-01-19 NOTE — Telephone Encounter (Signed)
To PCP

## 2017-01-22 ENCOUNTER — Other Ambulatory Visit: Payer: Self-pay | Admitting: Physician Assistant

## 2017-01-22 ENCOUNTER — Ambulatory Visit (INDEPENDENT_AMBULATORY_CARE_PROVIDER_SITE_OTHER): Payer: BLUE CROSS/BLUE SHIELD | Admitting: Medical

## 2017-01-22 ENCOUNTER — Encounter (HOSPITAL_COMMUNITY): Payer: Self-pay | Admitting: Medical

## 2017-01-22 VITALS — BP 126/74 | HR 94 | Resp 16 | Ht 69.0 in | Wt 263.0 lb

## 2017-01-22 DIAGNOSIS — M797 Fibromyalgia: Secondary | ICD-10-CM

## 2017-01-22 DIAGNOSIS — F99 Mental disorder, not otherwise specified: Secondary | ICD-10-CM | POA: Diagnosis not present

## 2017-01-22 DIAGNOSIS — F5105 Insomnia due to other mental disorder: Secondary | ICD-10-CM | POA: Diagnosis not present

## 2017-01-22 DIAGNOSIS — Z1231 Encounter for screening mammogram for malignant neoplasm of breast: Secondary | ICD-10-CM

## 2017-01-22 DIAGNOSIS — Z6837 Body mass index (BMI) 37.0-37.9, adult: Secondary | ICD-10-CM

## 2017-01-22 DIAGNOSIS — F3181 Bipolar II disorder: Secondary | ICD-10-CM

## 2017-01-22 DIAGNOSIS — G894 Chronic pain syndrome: Secondary | ICD-10-CM

## 2017-01-22 DIAGNOSIS — M1610 Unilateral primary osteoarthritis, unspecified hip: Secondary | ICD-10-CM | POA: Diagnosis not present

## 2017-01-22 DIAGNOSIS — Z62819 Personal history of unspecified abuse in childhood: Secondary | ICD-10-CM | POA: Diagnosis not present

## 2017-01-22 DIAGNOSIS — M5136 Other intervertebral disc degeneration, lumbar region: Secondary | ICD-10-CM

## 2017-01-22 DIAGNOSIS — F4312 Post-traumatic stress disorder, chronic: Secondary | ICD-10-CM

## 2017-01-22 DIAGNOSIS — Z639 Problem related to primary support group, unspecified: Secondary | ICD-10-CM

## 2017-01-22 MED ORDER — TRAZODONE HCL 50 MG PO TABS: ORAL_TABLET | ORAL | 2 refills | Status: DC

## 2017-01-22 MED ORDER — FLUOXETINE HCL 20 MG PO CAPS: 20.0000 mg | ORAL_CAPSULE | Freq: Every day | ORAL | 2 refills | Status: DC

## 2017-01-22 MED ORDER — LAMOTRIGINE 100 MG PO TABS: 100.0000 mg | ORAL_TABLET | Freq: Every day | ORAL | 0 refills | Status: DC
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
## 2017-01-26 ENCOUNTER — Other Ambulatory Visit: Payer: Self-pay | Admitting: Physician Assistant

## 2017-01-30 ENCOUNTER — Ambulatory Visit (INDEPENDENT_AMBULATORY_CARE_PROVIDER_SITE_OTHER): Payer: BLUE CROSS/BLUE SHIELD

## 2017-01-30 ENCOUNTER — Other Ambulatory Visit: Payer: Self-pay | Admitting: Physician Assistant

## 2017-01-30 DIAGNOSIS — Z1231 Encounter for screening mammogram for malignant neoplasm of breast: Secondary | ICD-10-CM

## 2017-01-30 NOTE — Progress Notes (Signed)
Call pt: normal mammogram follow up in one year.

## 2017-02-05 ENCOUNTER — Ambulatory Visit (INDEPENDENT_AMBULATORY_CARE_PROVIDER_SITE_OTHER): Payer: BLUE CROSS/BLUE SHIELD | Admitting: Sports Medicine

## 2017-02-05 DIAGNOSIS — H0015 Chalazion left lower eyelid: Secondary | ICD-10-CM | POA: Insufficient documentation

## 2017-02-05 MED ORDER — ERYTHROMYCIN 5 MG/GM OP OINT: 1.0000 "application " | TOPICAL_OINTMENT | Freq: Three times a day (TID) | OPHTHALMIC | 0 refills | Status: AC

## 2017-02-05 NOTE — Assessment & Plan Note (Signed)
Aggressive massage, warm compresses and the tincture of time. Erythromycin ophthalmic ointment. Return if no better in a couple of weeks.

## 2017-02-05 NOTE — Patient Instructions (Addendum)
Chalazion A chalazion is a swelling or lump on the eyelid. It can affect the upper or lower eyelid. What are the causes? This condition may be caused by:  Long-lasting (chronic) inflammation of the eyelid glands.  A blocked oil gland in the eyelid.  What are the signs or symptoms? Symptoms of this condition include:  A swelling on the eyelid. The swelling may spread to areas around the eye.  A hard lump on the eyelid. This lump may make it hard to see out of the eye.  How is this diagnosed? This condition is diagnosed with an examination of the eye. How is this treated? This condition is treated by applying a warm compress to the eyelid. If the condition does not improve after two days, it may be treated with:  Surgery.  Medicine that is injected into the chalazion by a health care provider.  Medicine that is applied to the eye.  Follow these instructions at home:  Do not touch the chalazion.  Do not try to remove the pus, such as by squeezing the chalazion or sticking it with a pin or needle.  Do not rub your eyes.  Wash your hands often. Dry your hands with a clean towel.  Keep your face, scalp, and eyebrows clean.  Avoid wearing eye makeup.  Apply a warm, moist compress to the eyelid 4-6 times a day for 10-15 minutes at a time. This will help to open any blocked glands and help to reduce redness and swelling.  Apply over-the-counter and prescription medicines only as told by your health care provider.  If the chalazion does not break open (rupture) on its own in a month, return to your health care provider.  Keep all follow-up appointments as told by your health care provider. This is important. Contact a health care provider if:  Your eyelid has not improved in 4 weeks.  Your eyelid is getting worse.  You have a fever.  The chalazion does not rupture on its own with home treatment in a month. Get help right away if:  You have pain in your eye.  Your  vision changes.  The chalazion becomes painful or red  The chalazion gets bigger. This information is not intended to replace advice given to you by your health care provider. Make sure you discuss any questions you have with your health care provider. Document Released: 04/18/2000 Document Revised: 09/27/2015 Document Reviewed: 08/14/2014 Elsevier Interactive Patient Education  [employer].

## 2017-02-05 NOTE — Progress Notes (Signed)
  Subjective:    CC: left eyelid swelling   HPI:  The patient is a 56 three year old female here today for left eye pain and swelling. She states the pain started yesterday. She was cleaning her dish drainer and rubbed her eye prior to washing her hands. She believes her eye may be irritated because of this. She reports some blurry vision of the left eye. Denies pain with EOM, drainage, itching, or watering. She has tried benadryl and warm compresses with some relief. Denies fever, chills, headache, or congestion. The patient also requested a refill of her Topamax.     Past medical history:  Negative.  See flowsheet/record as well for more information.  Surgical history: Negative.  See flowsheet/record as well for more information.  Family history: Negative.  See flowsheet/record as well for more information.  Social history: Negative.  See flowsheet/record as well for more information.  Allergies, and medications have been entered into the medical record, reviewed, and no changes needed.   Review of Systems: No fevers, chills, night sweats, weight loss, chest pain, or shortness of breath.   Objective:    General: Well Developed, well nourished, and in no acute distress.  Neuro: Alert and oriented x3, extra-ocular muscles intact, sensation grossly intact.  HEENT: Normocephalic, atraumatic, pupils equal round reactive to light, neck supple, no masses, no lymphadenopathy, thyroid nonpalpable.  Skin: Warm and dry, no rashes. Cardiac: Regular rate and rhythm, no murmurs rubs or gallops, no lower extremity edema.  Respiratory: Clear to auscultation bilaterally. Not using accessory muscles, speaking in full sentences.   Impression and Recommendations:    No problem-specific Assessment & Plan notes found for this encounter.  Lita was seen today for eye problem.  Diagnoses and all orders for this visit:  Chalazion left lower eyelid -     erythromycin ophthalmic ointment; Place 1  application into the left eye 3 (three) times daily. Apply 1 inch ribbon to affected eye TID for 5 days.  I suspect a chalazion of the left lower eyelid. Suggested warm compresses and massage for pain relief. Erythromycin ophthalmic ointment given today. Refill of Topamax sent to pharmacy.  ___________________________________________ Gwen Her. Dianah Field, M.D., ABFM., CAQSM. Primary Care and [place] Instructor of [place] [facility] [employer]

## 2017-02-12 ENCOUNTER — Ambulatory Visit (INDEPENDENT_AMBULATORY_CARE_PROVIDER_SITE_OTHER): Payer: BLUE CROSS/BLUE SHIELD

## 2017-02-12 ENCOUNTER — Ambulatory Visit (INDEPENDENT_AMBULATORY_CARE_PROVIDER_SITE_OTHER): Payer: BLUE CROSS/BLUE SHIELD | Admitting: Sports Medicine

## 2017-02-12 DIAGNOSIS — W19XXXA Unspecified fall, initial encounter: Secondary | ICD-10-CM | POA: Diagnosis not present

## 2017-02-12 DIAGNOSIS — S92414A Nondisplaced fracture of proximal phalanx of right great toe, initial encounter for closed fracture: Secondary | ICD-10-CM | POA: Diagnosis not present

## 2017-02-12 DIAGNOSIS — S92501A Displaced unspecified fracture of right lesser toe(s), initial encounter for closed fracture: Secondary | ICD-10-CM | POA: Diagnosis not present

## 2017-02-12 DIAGNOSIS — S92502A Displaced unspecified fracture of left lesser toe(s), initial encounter for closed fracture: Secondary | ICD-10-CM | POA: Insufficient documentation

## 2017-02-12 DIAGNOSIS — S99921A Unspecified injury of right foot, initial encounter: Secondary | ICD-10-CM

## 2017-02-12 DIAGNOSIS — Z23 Encounter for immunization: Secondary | ICD-10-CM

## 2017-02-12 DIAGNOSIS — M160 Bilateral primary osteoarthritis of hip: Secondary | ICD-10-CM

## 2017-02-12 MED ORDER — HYDROCODONE-ACETAMINOPHEN 5-325 MG PO TABS: 1.0000 | ORAL_TABLET | Freq: Three times a day (TID) | ORAL | 0 refills | Status: DC | PRN

## 2017-02-12 MED ORDER — TOPIRAMATE 50 MG PO TABS: 100.0000 mg | ORAL_TABLET | Freq: Two times a day (BID) | ORAL | 2 refills | Status: DC

## 2017-02-12 NOTE — Assessment & Plan Note (Signed)
Toe was injured after a fall, unclear mechanism.  Tenderness is predominantly at the first IP joint Postop shoe, hydrocodone, x-rays. Follow-up will depend on x-ray results.

## 2017-02-12 NOTE — Progress Notes (Signed)
   Subjective:    I'm seeing this patient as a consultation for:  [name], PA-C  CC: Right foot injury  HPI: This is a pleasant 41 year old female, she recently had a fall that she feels is due to her hip arthritis, injured her right toe with pain, swelling, bruising at the first interphalangeal joint, severe, persistent.  Primary hip osteoarthritis: Has had several injections, the most recent of which was in August, she definitely needs arthroplasty, injections aren't lasting quite long enough. Finally agrees to proceed with referral.  Past medical history, Surgical history, Family history not pertinant except as noted below, Social history, Allergies, and medications have been entered into the medical record, reviewed, and no changes needed.   Review of Systems: No headache, visual changes, nausea, vomiting, diarrhea, constipation, dizziness, abdominal pain, skin rash, fevers, chills, night sweats, weight loss, swollen lymph nodes, body aches, joint swelling, muscle aches, chest pain, shortness of breath, mood changes, visual or auditory hallucinations.   Objective:   General: Well Developed, well nourished, and in no acute distress.  Neuro:  Extra-ocular muscles intact, able to move all 4 extremities, sensation grossly intact.  Deep tendon reflexes tested were normal. Psych: Alert and oriented, mood congruent with affect. ENT:  Ears and nose appear unremarkable.  Hearing grossly normal. Neck: Unremarkable overall appearance, trachea midline.  No visible thyroid enlargement. Eyes: Conjunctivae and lids appear unremarkable.  Pupils equal and round. Skin: Warm and dry, no rashes noted.  Cardiovascular: Pulses palpable, no extremity edema. Right Foot: Right great toe swollen, bruised with tenderness at the IP joint. Range of motion is full in all directions. Strength is 5/5 in all directions. No hallux valgus. No pes cavus or pes planus. No abnormal callus noted. No pain over the  navicular prominence, or base of fifth metatarsal. No tenderness to palpation of the calcaneal insertion of plantar fascia. No pain at the Achilles insertion. No pain over the calcaneal bursa. No pain of the retrocalcaneal bursa. No tenderness to palpation over the tarsals, metatarsals, or phalanges. No hallux rigidus or limitus. No tenderness palpation over interphalangeal joints. No pain with compression of the metatarsal heads. Neurovascularly intact distally.  X-rays personally reviewed, there is an equivocal fracture through the proximal phalanx, nondisplaced, non-angulated.  Impression and Recommendations:   This case required medical decision making of moderate complexity.  Injury of foot, right Toe was injured after a fall, unclear mechanism.  Tenderness is predominantly at the first IP joint Postop shoe, hydrocodone, x-rays. Follow-up will depend on x-ray results.  Osteoarthritis of both hips Has occasional hip joint injections, we have been trying to get her to proceed with hip arthroplasty. I think at this point she's had a fall, injured her toe, and finally agrees.  Referral to Dr. Mayer Camel to discuss arthroplasty  _________________________________________ Gwen Her. Dianah Field, M.D., ABFM., CAQSM. Primary Care and [place] Instructor of [place] [facility]

## 2017-02-12 NOTE — Assessment & Plan Note (Signed)
Has occasional hip joint injections, we have been trying to get her to proceed with hip arthroplasty. I think at this point she's had a fall, injured her toe, and finally agrees.  Referral to Dr. Mayer Camel to discuss arthroplasty

## 2017-02-24 DIAGNOSIS — M25552 Pain in left hip: Secondary | ICD-10-CM | POA: Diagnosis not present

## 2017-02-24 DIAGNOSIS — M25551 Pain in right hip: Secondary | ICD-10-CM | POA: Diagnosis not present

## 2017-02-27 ENCOUNTER — Other Ambulatory Visit (HOSPITAL_COMMUNITY): Payer: Self-pay | Admitting: Orthopedic Surgery

## 2017-02-27 DIAGNOSIS — M25552 Pain in left hip: Secondary | ICD-10-CM

## 2017-02-27 DIAGNOSIS — M25551 Pain in right hip: Secondary | ICD-10-CM

## 2017-03-03 ENCOUNTER — Ambulatory Visit (INDEPENDENT_AMBULATORY_CARE_PROVIDER_SITE_OTHER): Payer: BLUE CROSS/BLUE SHIELD | Admitting: Sports Medicine

## 2017-03-03 ENCOUNTER — Other Ambulatory Visit: Payer: Self-pay | Admitting: Sports Medicine

## 2017-03-03 DIAGNOSIS — M160 Bilateral primary osteoarthritis of hip: Secondary | ICD-10-CM | POA: Diagnosis not present

## 2017-03-03 MED ORDER — TRAMADOL HCL 50 MG PO TABS: 50.0000 mg | ORAL_TABLET | Freq: Three times a day (TID) | ORAL | 0 refills | Status: DC | PRN

## 2017-03-03 NOTE — Progress Notes (Signed)
  Subjective:    CC: Right hip pain  HPI: This is a pleasant 41 year old female, she does have mild on imaging hip osteoarthritis, done a few hip joint injections, unfortunately has persistent pain.  We did have her discuss this with orthopedic surgery, they are obtaining an MRI likely looking for evidence of hip AVN.  Pain is severe, persistent, she needs something for pain to hold her over until further evaluation in a month.  Past medical history:  Negative.  See flowsheet/record as well for more information.  Surgical history: Negative.  See flowsheet/record as well for more information.  Family history: Negative.  See flowsheet/record as well for more information.  Social history: Negative.  See flowsheet/record as well for more information.  Allergies, and medications have been entered into the medical record, reviewed, and no changes needed.   Review of Systems: No fevers, chills, night sweats, weight loss, chest pain, or shortness of breath.   Objective:    General: Well Developed, well nourished, and in no acute distress.  Neuro: Alert and oriented x3, extra-ocular muscles intact, sensation grossly intact.  HEENT: Normocephalic, atraumatic, pupils equal round reactive to light, neck supple, no masses, no lymphadenopathy, thyroid nonpalpable.  Skin: Warm and dry, no rashes. Cardiac: Regular rate and rhythm, no murmurs rubs or gallops, no lower extremity edema.  Respiratory: Clear to auscultation bilaterally. Not using accessory muscles, speaking in full sentences. Right hip: ROM IR: 60 Deg, ER: 60 Deg, Flexion: 120 Deg, Extension: 100 Deg, Abduction: 45 Deg, Adduction: 45 Deg pain with internal rotation Strength IR: 5/5, ER: 5/5, Flexion: 4-/5, Extension: 5/5, Abduction: 5/5, Adduction: 5/5 Pelvic alignment unremarkable to inspection and palpation. Standing hip rotation and gait without trendelenburg / unsteadiness. Greater trochanter without tenderness to palpation. No  tenderness over piriformis. No SI joint tenderness and normal minimal SI movement.  Impression and Recommendations:    Osteoarthritis of both hips Last hip joint injection was 2.5 months ago, finally agreeable to proceed with hip arthroplasty, Dr. Mayer Camel is getting an MRI considering her x-rays do not look that remarkable and likely excluding AVN. Adding some tramadol to carry her over in the meantime.  I spent 25 minutes with this patient, greater than 50% was face-to-face time counseling regarding the above diagnoses ___________________________________________ Gwen Her. Dianah Field, M.D., ABFM., CAQSM. Primary Care and [employer] [employer] [facility]

## 2017-03-03 NOTE — Assessment & Plan Note (Addendum)
Last hip joint injection was 2.5 months ago, finally agreeable to proceed with hip arthroplasty, Dr. Mayer Camel is getting an MRI considering her x-rays do not look that remarkable and likely excluding AVN. Adding some tramadol to carry her over in the meantime.

## 2017-03-13 DIAGNOSIS — M1991 Primary osteoarthritis, unspecified site: Secondary | ICD-10-CM | POA: Diagnosis not present

## 2017-03-13 DIAGNOSIS — Z791 Long term (current) use of non-steroidal anti-inflammatories (NSAID): Secondary | ICD-10-CM | POA: Diagnosis not present

## 2017-03-13 DIAGNOSIS — Z888 Allergy status to other drugs, medicaments and biological substances status: Secondary | ICD-10-CM | POA: Diagnosis not present

## 2017-03-13 DIAGNOSIS — M25551 Pain in right hip: Secondary | ICD-10-CM | POA: Diagnosis not present

## 2017-03-13 DIAGNOSIS — J45909 Unspecified asthma, uncomplicated: Secondary | ICD-10-CM | POA: Diagnosis not present

## 2017-03-13 DIAGNOSIS — Z883 Allergy status to other anti-infective agents status: Secondary | ICD-10-CM | POA: Diagnosis not present

## 2017-03-13 DIAGNOSIS — I10 Essential (primary) hypertension: Secondary | ICD-10-CM | POA: Diagnosis not present

## 2017-03-13 DIAGNOSIS — Z87891 Personal history of nicotine dependence: Secondary | ICD-10-CM | POA: Diagnosis not present

## 2017-03-13 DIAGNOSIS — F329 Major depressive disorder, single episode, unspecified: Secondary | ICD-10-CM | POA: Diagnosis not present

## 2017-03-13 DIAGNOSIS — Z79899 Other long term (current) drug therapy: Secondary | ICD-10-CM | POA: Diagnosis not present

## 2017-03-13 DIAGNOSIS — Z885 Allergy status to narcotic agent status: Secondary | ICD-10-CM | POA: Diagnosis not present

## 2017-03-15 ENCOUNTER — Other Ambulatory Visit: Payer: Self-pay | Admitting: Physician Assistant

## 2017-03-15 DIAGNOSIS — M8580 Other specified disorders of bone density and structure, unspecified site: Secondary | ICD-10-CM

## 2017-03-15 DIAGNOSIS — E559 Vitamin D deficiency, unspecified: Secondary | ICD-10-CM

## 2017-03-16 ENCOUNTER — Other Ambulatory Visit: Payer: Self-pay | Admitting: *Deleted

## 2017-03-20 ENCOUNTER — Encounter (HOSPITAL_COMMUNITY)
Admission: RE | Admit: 2017-03-20 | Discharge: 2017-03-20 | Disposition: A | Discharge status: Home / Self Care | Payer: BLUE CROSS/BLUE SHIELD | Source: Ambulatory Visit | Attending: Orthopedic Surgery | Admitting: Orthopedic Surgery

## 2017-03-20 ENCOUNTER — Encounter (HOSPITAL_COMMUNITY): Payer: Self-pay

## 2017-03-20 DIAGNOSIS — Z01812 Encounter for preprocedural laboratory examination: Secondary | ICD-10-CM | POA: Insufficient documentation

## 2017-03-20 DIAGNOSIS — Z01810 Encounter for preprocedural cardiovascular examination: Secondary | ICD-10-CM | POA: Insufficient documentation

## 2017-03-20 DIAGNOSIS — I10 Essential (primary) hypertension: Secondary | ICD-10-CM | POA: Insufficient documentation

## 2017-03-20 HISTORY — DX: Claustrophobia: F40.240

## 2017-03-20 LAB — CBC
HCT: 43.9 % (ref 36.0–46.0)
HEMOGLOBIN: 14.4 g/dL (ref 12.0–15.0)
MCH: 31.1 pg (ref 26.0–34.0)
MCHC: 32.8 g/dL (ref 30.0–36.0)
MCV: 94.8 fL (ref 78.0–100.0)
PLATELETS: 223 10*3/uL (ref 150–400)
RBC: 4.63 MIL/uL (ref 3.87–5.11)
RDW: 13.9 % (ref 11.5–15.5)
WBC: 11.5 10*3/uL — ABNORMAL HIGH (ref 4.0–10.5)

## 2017-03-20 LAB — BASIC METABOLIC PANEL
ANION GAP: 6 (ref 5–15)
BUN: 5 mg/dL — ABNORMAL LOW (ref 6–20)
CHLORIDE: 105 mmol/L (ref 101–111)
CO2: 28 mmol/L (ref 22–32)
Calcium: 9.4 mg/dL (ref 8.9–10.3)
Creatinine, Ser: 0.63 mg/dL (ref 0.44–1.00)
GFR calc Af Amer: >60 mL/min (ref >60)
GLUCOSE: 92 mg/dL (ref 65–99)
POTASSIUM: 3.9 mmol/L (ref 3.5–5.1)
Sodium: 139 mmol/L (ref 135–145)

## 2017-03-20 NOTE — Pre-Procedure Instructions (Signed)
    Kathryn Cline  03/20/2017      [id number] [place] [facility] [address] [place]Phone: [phone]: [phone]   Your procedure is scheduled on 03/24/17.  [facility]  Call this number if you have problems the morning of surgery:  [phone]   Remember:  Do not eat food or drink liquids after midnight.  Take these medicines the morning of surgery with A SIP OF WATER --all inhalers,tylenol,atenolol,zyrte,cymbalta,prozac,ativan,protonix   Do not wear jewelry, make-up or nail polish.  Do not wear lotions, powders, or perfumes, or deoderant.  Do not shave 48 hours prior to surgery.  Men may shave face and neck.  Do not bring valuables to the hospital.  [facility] or [facility].  Contacts, dentures or bridgework may not be worn into surgery.  Leave your suitcase in the car.  After surgery it may be brought to your room.  For patients admitted to the hospital, discharge time will be determined by your treatment team.  Patients discharged the day of surgery will not be allowed to drive home.   Name and phone number of your drive  Please read over the following fact sheets that you were given.

## 2017-03-20 NOTE — Pre-Procedure Instructions (Signed)
    Merl Guardino  03/20/2017      [id number] [place] [address] [place]Phone: [phone]: [phone]   Your procedure is scheduled on 03/24/17.  [facility]  Call this number if you have problems the morning of surgery:  [phone]   Remember:  Do not eat food or drink liquids after midnight.  Take these medicines the morning of surgery with A SIP OF WATER --tylenol,all in halers,atenolol,zyrtec,cymbalta,prozac,ativan,protonix,ultram  Do not wear jewelry, make-up or nail polish.  Do not wear lotions, powders, or perfumes, or deoderant.  Do not shave 48 hours prior to surgery.  Men may shave face and neck.  Do not bring valuables to the hospital.  [facility] belongings or [facility]ables.  Contacts, dentures or bridgework may not be worn into surgery.  Leave your suitcase in the car.  After surgery it may be brought to your room.  For patients admitted to the hospital, discharge time will be determined by your treatment team.  Patients discharged the day of surgery will not be allowed to drive h Please read over the following fact sheets that you were given.

## 2017-03-24 ENCOUNTER — Encounter (HOSPITAL_COMMUNITY): Admission: RE | Disposition: A | Discharge status: Home / Self Care | Payer: Self-pay | Source: Ambulatory Visit

## 2017-03-24 ENCOUNTER — Other Ambulatory Visit: Payer: Self-pay

## 2017-03-24 ENCOUNTER — Ambulatory Visit (HOSPITAL_COMMUNITY)
Admission: RE | Admit: 2017-03-24 | Discharge: 2017-03-24 | Disposition: A | Discharge status: Home / Self Care | Payer: BLUE CROSS/BLUE SHIELD | Source: Ambulatory Visit | Attending: Diagnostic Radiology | Admitting: Diagnostic Radiology

## 2017-03-24 ENCOUNTER — Encounter (HOSPITAL_COMMUNITY): Payer: Self-pay

## 2017-03-24 ENCOUNTER — Ambulatory Visit (HOSPITAL_COMMUNITY)
Admission: RE | Admit: 2017-03-24 | Discharge: 2017-03-24 | Disposition: A | Discharge status: Home / Self Care | Payer: BLUE CROSS/BLUE SHIELD | Source: Ambulatory Visit | Attending: Orthopedic Surgery | Admitting: Orthopedic Surgery

## 2017-03-24 ENCOUNTER — Ambulatory Visit (HOSPITAL_COMMUNITY): Payer: BLUE CROSS/BLUE SHIELD

## 2017-03-24 ENCOUNTER — Ambulatory Visit (HOSPITAL_COMMUNITY): Payer: BLUE CROSS/BLUE SHIELD | Admitting: Emergency Medicine

## 2017-03-24 ENCOUNTER — Ambulatory Visit (HOSPITAL_COMMUNITY): Payer: BLUE CROSS/BLUE SHIELD | Admitting: Certified Registered Nurse Anesthetist

## 2017-03-24 DIAGNOSIS — F4312 Post-traumatic stress disorder, chronic: Secondary | ICD-10-CM | POA: Insufficient documentation

## 2017-03-24 DIAGNOSIS — K219 Gastro-esophageal reflux disease without esophagitis: Secondary | ICD-10-CM | POA: Diagnosis not present

## 2017-03-24 DIAGNOSIS — M25552 Pain in left hip: Secondary | ICD-10-CM | POA: Diagnosis not present

## 2017-03-24 DIAGNOSIS — M797 Fibromyalgia: Secondary | ICD-10-CM | POA: Insufficient documentation

## 2017-03-24 DIAGNOSIS — I10 Essential (primary) hypertension: Secondary | ICD-10-CM | POA: Insufficient documentation

## 2017-03-24 DIAGNOSIS — J45909 Unspecified asthma, uncomplicated: Secondary | ICD-10-CM | POA: Diagnosis not present

## 2017-03-24 DIAGNOSIS — F419 Anxiety disorder, unspecified: Secondary | ICD-10-CM | POA: Insufficient documentation

## 2017-03-24 DIAGNOSIS — M1611 Unilateral primary osteoarthritis, right hip: Secondary | ICD-10-CM | POA: Insufficient documentation

## 2017-03-24 DIAGNOSIS — Z87891 Personal history of nicotine dependence: Secondary | ICD-10-CM | POA: Diagnosis not present

## 2017-03-24 DIAGNOSIS — F329 Major depressive disorder, single episode, unspecified: Secondary | ICD-10-CM | POA: Insufficient documentation

## 2017-03-24 DIAGNOSIS — M1612 Unilateral primary osteoarthritis, left hip: Secondary | ICD-10-CM | POA: Diagnosis not present

## 2017-03-24 DIAGNOSIS — Z79899 Other long term (current) drug therapy: Secondary | ICD-10-CM | POA: Insufficient documentation

## 2017-03-24 DIAGNOSIS — Z6837 Body mass index (BMI) 37.0-37.9, adult: Secondary | ICD-10-CM | POA: Diagnosis not present

## 2017-03-24 DIAGNOSIS — M25551 Pain in right hip: Secondary | ICD-10-CM

## 2017-03-24 HISTORY — PX: RADIOLOGY WITH ANESTHESIA: SHX6223

## 2017-03-24 SURGERY — MRI WITH ANESTHESIA
Anesthesia: General | Laterality: Bilateral

## 2017-03-24 MED ORDER — LACTATED RINGERS IV SOLN
INTRAVENOUS | Status: DC
  Administered 2017-03-24: 08:00:00 via INTRAVENOUS

## 2017-03-24 MED ORDER — MEPERIDINE HCL 25 MG/ML IJ SOLN
6.2500 mg | INTRAMUSCULAR | Status: DC | PRN
Start: 2017-03-24 — End: 2017-03-24

## 2017-03-24 MED ORDER — HYDROMORPHONE HCL 1 MG/ML IJ SOLN: 0.2500 mg | INTRAMUSCULAR | Status: DC | PRN

## 2017-03-24 MED ORDER — PROMETHAZINE HCL 25 MG/ML IJ SOLN: 6.2500 mg | INTRAMUSCULAR | Status: DC | PRN

## 2017-03-24 NOTE — Anesthesia Postprocedure Evaluation (Signed)
Anesthesia Post Note  Patient: Kathryn Cline  Procedure(s) Performed: MRI OF BILATERAL HIP WITHOUT  (Bilateral )     Patient location during evaluation: PACU Anesthesia Type: General Level of consciousness: awake and alert Pain management: pain level controlled Vital Signs Assessment: post-procedure vital signs reviewed and stable Respiratory status: spontaneous breathing, nonlabored ventilation and respiratory function stable Cardiovascular status: blood pressure returned to baseline and stable Postop Assessment: no apparent nausea or vomiting Anesthetic complications: no    Last Vitals:  Vitals:   03/24/17 0950 03/24/17 1015  BP: (!) 152/68 139/71  Pulse: 91 88  Resp: 19 18  Temp: 36.7 C   SpO2: 93% 91%    Last Pain:  Vitals:   03/24/17 1015  TempSrc:   PainSc: Asleep                 Lynda Rainwater

## 2017-03-24 NOTE — Transfer of Care (Signed)
Immediate Anesthesia Transfer of Care Note  Patient: Kathryn Cline  Procedure(s) Performed: MRI OF BILATERAL HIP WITHOUT  (Bilateral )  Patient Location: PACU  Anesthesia Type:General  Level of Consciousness: awake and patient cooperative  Airway & Oxygen Therapy: Patient Spontanous Breathing and Patient connected to nasal cannula oxygen  Post-op Assessment: Report given to RN and Post -op Vital signs reviewed and stable  Post vital signs: Reviewed and stable  Last Vitals:  Vitals:   03/24/17 0950 03/24/17 1015  BP: (!) 152/68 139/71  Pulse: 91 88  Resp: 19 18  Temp:    SpO2: 93% 91%    Last Pain:  Vitals:   03/24/17 1015  TempSrc:   PainSc: Asleep      Patients Stated Pain Goal: 3 (40/97/35 3299)  Complications: No apparent anesthesia complications

## 2017-03-24 NOTE — Anesthesia Preprocedure Evaluation (Signed)
Anesthesia Evaluation  Patient identified by MRN, date of birth, ID band Patient awake    Reviewed: Allergy & Precautions, H&P , NPO status , Patient's Chart, lab work & pertinent test results, reviewed documented beta blocker date and time   Airway Mallampati: II  TM Distance: >3 FB Neck ROM: Full    Dental no notable dental hx. (+) Teeth Intact, Dental Advisory Given   Pulmonary asthma , former smoker,    Pulmonary exam normal breath sounds clear to auscultation       Cardiovascular hypertension, Pt. on medications and Pt. on home beta blockers  Rhythm:Regular Rate:Normal     Neuro/Psych  Headaches, PSYCHIATRIC DISORDERS Anxiety Depression PTSD   GI/Hepatic Neg liver ROS, GERD  Medicated and Controlled,  Endo/Other  Morbid obesity  Renal/GU negative Renal ROS  negative genitourinary   Musculoskeletal  (+) Arthritis , Osteoarthritis,  Fibromyalgia -  Abdominal (+) + obese,   Peds  Hematology negative hematology ROS (+)   Anesthesia Other Findings   Reproductive/Obstetrics negative OB ROS                             Anesthesia Physical  Anesthesia Plan  ASA: III  Anesthesia Plan: General   Post-op Pain Management:    Induction: Intravenous  PONV Risk Score and Plan: 3 and Ondansetron, Dexamethasone and Midazolam  Airway Management Planned: Oral ETT and LMA  Additional Equipment:   Intra-op Plan:   Post-operative Plan: Extubation in OR  Informed Consent: I have reviewed the patients History and Physical, chart, labs and discussed the procedure including the risks, benefits and alternatives for the proposed anesthesia with the patient or authorized representative who has indicated his/her understanding and acceptance.   Dental advisory given  Plan Discussed with: CRNA  Anesthesia Plan Comments:         Anesthesia Quick Evaluation

## 2017-03-24 NOTE — Anesthesia Procedure Notes (Signed)
Procedure Name: Intubation Date/Time: 03/24/2017 8:17 AM Performed by: Lance Coon, CRNA Pre-anesthesia Checklist: Patient identified, Emergency Drugs available, Suction available, Patient being monitored and Timeout performed Patient Re-evaluated:Patient Re-evaluated prior to induction Oxygen Delivery Method: Circle system utilized Preoxygenation: Pre-oxygenation with 100% oxygen Induction Type: IV induction Ventilation: Mask ventilation without difficulty Laryngoscope Size: Miller and 3 Grade View: Grade I Tube type: Oral Tube size: 7.5 mm Number of attempts: 1 Airway Equipment and Method: Stylet Placement Confirmation: positive ETCO2,  ETT inserted through vocal cords under direct vision and breath sounds checked- equal and bilateral Secured at: 22 cm Tube secured with: Tape Dental Injury: Teeth and Oropharynx as per pre-operative assessment

## 2017-03-25 MED FILL — Succinylcholine Chloride Sol Pref Syr 200 MG/10ML (20 MG/ML): INTRAVENOUS | Qty: 10 | Status: AC

## 2017-03-25 MED FILL — Lidocaine HCl IV Inj 20 MG/ML: INTRAVENOUS | Qty: 5 | Status: AC

## 2017-03-25 MED FILL — Midazolam HCl Inj 2 MG/2ML (Base Equivalent): INTRAMUSCULAR | Qty: 2 | Status: AC

## 2017-03-25 MED FILL — Propofol IV Emul 500 MG/50ML (10 MG/ML): INTRAVENOUS | Qty: 50 | Status: AC

## 2017-03-25 MED FILL — Albuterol Sulfate Inhal Aero 108 MCG/ACT (90MCG Base Equiv): RESPIRATORY_TRACT | Qty: 6.7 | Status: AC

## 2017-03-25 MED FILL — Dexamethasone Sodium Phosphate Inj 10 MG/ML: INTRAMUSCULAR | Qty: 1 | Status: AC

## 2017-03-25 MED FILL — Fentanyl Citrate Preservative Free (PF) Inj 100 MCG/2ML: INTRAMUSCULAR | Qty: 2 | Status: AC

## 2017-03-25 MED FILL — Ondansetron HCl Inj 4 MG/2ML (2 MG/ML): INTRAMUSCULAR | Qty: 2 | Status: AC

## 2017-03-25 MED FILL — Lactated Ringer's Solution: INTRAVENOUS | Qty: 1000 | Status: AC

## 2017-03-27 ENCOUNTER — Encounter (HOSPITAL_COMMUNITY): Payer: Self-pay | Admitting: Radiology

## 2017-03-31 ENCOUNTER — Ambulatory Visit (INDEPENDENT_AMBULATORY_CARE_PROVIDER_SITE_OTHER): Payer: BLUE CROSS/BLUE SHIELD | Admitting: Family Medicine

## 2017-03-31 ENCOUNTER — Ambulatory Visit (HOSPITAL_COMMUNITY): Payer: BLUE CROSS/BLUE SHIELD

## 2017-03-31 ENCOUNTER — Encounter: Payer: Self-pay | Admitting: Family Medicine

## 2017-03-31 ENCOUNTER — Ambulatory Visit (INDEPENDENT_AMBULATORY_CARE_PROVIDER_SITE_OTHER): Payer: BLUE CROSS/BLUE SHIELD

## 2017-03-31 VITALS — BP 126/84 | HR 106 | Temp 98.4°F | Ht 68.0 in | Wt 274.0 lb

## 2017-03-31 DIAGNOSIS — R079 Chest pain, unspecified: Secondary | ICD-10-CM | POA: Diagnosis not present

## 2017-03-31 DIAGNOSIS — J45909 Unspecified asthma, uncomplicated: Secondary | ICD-10-CM | POA: Diagnosis not present

## 2017-03-31 DIAGNOSIS — R509 Fever, unspecified: Secondary | ICD-10-CM

## 2017-03-31 DIAGNOSIS — R05 Cough: Secondary | ICD-10-CM | POA: Diagnosis not present

## 2017-03-31 DIAGNOSIS — J4541 Moderate persistent asthma with (acute) exacerbation: Secondary | ICD-10-CM | POA: Diagnosis not present

## 2017-03-31 MED ORDER — PREDNISONE 10 MG PO TABS: 30.0000 mg | ORAL_TABLET | Freq: Every day | ORAL | 0 refills | Status: DC

## 2017-03-31 MED ORDER — BENZONATATE 200 MG PO CAPS: 200.0000 mg | ORAL_CAPSULE | Freq: Three times a day (TID) | ORAL | 0 refills | Status: DC | PRN

## 2017-03-31 MED ORDER — LEVOFLOXACIN 500 MG PO TABS: 500.0000 mg | ORAL_TABLET | Freq: Every day | ORAL | 0 refills | Status: DC

## 2017-03-31 MED ORDER — ALBUTEROL SULFATE HFA 108 (90 BASE) MCG/ACT IN AERS: 1.0000 | INHALATION_SPRAY | Freq: Four times a day (QID) | RESPIRATORY_TRACT | 1 refills | Status: DC | PRN

## 2017-03-31 NOTE — Patient Instructions (Signed)
Thank you for coming in today. Take prednisone and Levaquin Use albuterol as needed.  Get xray of your lungs now.  Take tessalon for cough as needed.  Recheck if not better.  Call or go to the emergency room if you get worse, have trouble breathing, have chest pains, or palpitations.     Community-Acquired Pneumonia, Adult Pneumonia is an infection of the lungs. One type of pneumonia can happen while a person is in a hospital. A different type can happen when a person is not in a hospital (community-acquired pneumonia). It is easy for this kind to spread from person to person. It can spread to you if you breathe near an infected person who coughs or sneezes. Some symptoms include:  A dry cough.  A wet (productive) cough.  Fever.  Sweating.  Chest pain.  Follow these instructions at home:  Take over-the-counter and prescription medicines only as told by your doctor. ? Only take cough medicine if you are losing sleep. ? If you were prescribed an antibiotic medicine, take it as told by your doctor. Do not stop taking the antibiotic even if you start to feel better.  Sleep with your head and neck raised (elevated). You can do this by putting a few pillows under your head, or you can sleep in a recliner.  Do not use tobacco products. These include cigarettes, chewing tobacco, and e-cigarettes. If you need help quitting, ask your doctor.  Drink enough water to keep your pee (urine) clear or pale yellow. A shot (vaccine) can help prevent pneumonia. Shots are often suggested for:  People older than 43 years of age.  People older than 43 years of age: ? Who are having cancer treatment. ? Who have long-term (chronic) lung disease. ? Who have problems with their body's defense system (immune system).  You may also prevent pneumonia if you take these actions:  Get the flu (influenza) shot every year.  Go to the dentist as often as told.  Wash your hands often. If soap and water  are not available, use hand sanitizer.  Contact a doctor if:  You have a fever.  You lose sleep because your cough medicine does not help. Get help right away if:  You are short of breath and it gets worse.  You have more chest pain.  Your sickness gets worse. This is very serious if: ? You are an older adult. ? Your body's defense system is weak.  You cough up blood. This information is not intended to replace advice given to you by your health care provider. Make sure you discuss any questions you have with your health care provider. Document Released: 10/08/2007 Document Revised: 09/27/2015 Document Reviewed: 08/16/2014 Elsevier Interactive Patient Education  [employer].

## 2017-04-01 ENCOUNTER — Ambulatory Visit: Payer: Self-pay | Admitting: Physician Assistant

## 2017-04-01 ENCOUNTER — Ambulatory Visit: Payer: Self-pay | Admitting: Family Medicine
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
## 2017-04-09 DIAGNOSIS — M25551 Pain in right hip: Secondary | ICD-10-CM | POA: Diagnosis not present

## 2017-04-09 DIAGNOSIS — M25552 Pain in left hip: Secondary | ICD-10-CM | POA: Diagnosis not present

## 2017-04-19 ENCOUNTER — Other Ambulatory Visit: Payer: Self-pay | Admitting: Physician Assistant

## 2017-04-19 DIAGNOSIS — M8580 Other specified disorders of bone density and structure, unspecified site: Secondary | ICD-10-CM

## 2017-04-19 DIAGNOSIS — E559 Vitamin D deficiency, unspecified: Secondary | ICD-10-CM

## 2017-04-22 ENCOUNTER — Other Ambulatory Visit: Payer: Self-pay | Admitting: Sports Medicine

## 2017-04-22 DIAGNOSIS — M160 Bilateral primary osteoarthritis of hip: Secondary | ICD-10-CM

## 2017-04-23 ENCOUNTER — Encounter (HOSPITAL_COMMUNITY): Payer: Self-pay | Admitting: Medical

## 2017-04-23 ENCOUNTER — Ambulatory Visit (INDEPENDENT_AMBULATORY_CARE_PROVIDER_SITE_OTHER): Payer: BLUE CROSS/BLUE SHIELD | Admitting: Medical

## 2017-04-23 VITALS — BP 130/86 | HR 90 | Ht 68.5 in | Wt 279.0 lb

## 2017-04-23 DIAGNOSIS — F5105 Insomnia due to other mental disorder: Secondary | ICD-10-CM

## 2017-04-23 DIAGNOSIS — G894 Chronic pain syndrome: Secondary | ICD-10-CM

## 2017-04-23 DIAGNOSIS — F3181 Bipolar II disorder: Secondary | ICD-10-CM

## 2017-04-23 DIAGNOSIS — Z639 Problem related to primary support group, unspecified: Secondary | ICD-10-CM

## 2017-04-23 DIAGNOSIS — F4312 Post-traumatic stress disorder, chronic: Secondary | ICD-10-CM

## 2017-04-23 DIAGNOSIS — M797 Fibromyalgia: Secondary | ICD-10-CM

## 2017-04-23 DIAGNOSIS — F4321 Adjustment disorder with depressed mood: Secondary | ICD-10-CM | POA: Diagnosis not present

## 2017-04-23 DIAGNOSIS — Z6837 Body mass index (BMI) 37.0-37.9, adult: Secondary | ICD-10-CM | POA: Diagnosis not present

## 2017-04-23 DIAGNOSIS — Z62819 Personal history of unspecified abuse in childhood: Secondary | ICD-10-CM

## 2017-04-23 DIAGNOSIS — T7422XS Child sexual abuse, confirmed, sequela: Secondary | ICD-10-CM

## 2017-04-23 DIAGNOSIS — F4381 Prolonged grief disorder: Secondary | ICD-10-CM

## 2017-04-23 DIAGNOSIS — F99 Mental disorder, not otherwise specified: Secondary | ICD-10-CM

## 2017-04-23 DIAGNOSIS — M1610 Unilateral primary osteoarthritis, unspecified hip: Secondary | ICD-10-CM

## 2017-04-23 MED ORDER — TRAZODONE HCL 50 MG PO TABS: ORAL_TABLET | ORAL | 2 refills | Status: DC

## 2017-04-23 MED ORDER — LAMOTRIGINE 100 MG PO TABS: 100.0000 mg | ORAL_TABLET | Freq: Every day | ORAL | 1 refills | Status: DC

## 2017-04-23 MED ORDER — DULOXETINE HCL 60 MG PO CPEP: 60.0000 mg | ORAL_CAPSULE | Freq: Every day | ORAL | 1 refills | Status: DC

## 2017-04-23 MED ORDER — FLUOXETINE HCL 20 MG PO CAPS: 20.0000 mg | ORAL_CAPSULE | Freq: Every day | ORAL | 2 refills | Status: DC
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
## 2017-04-26 ENCOUNTER — Other Ambulatory Visit (HOSPITAL_COMMUNITY): Payer: Self-pay | Admitting: Medical

## 2017-05-06 ENCOUNTER — Other Ambulatory Visit: Payer: Self-pay | Admitting: Sports Medicine

## 2017-05-06 DIAGNOSIS — M160 Bilateral primary osteoarthritis of hip: Secondary | ICD-10-CM

## 2017-05-11 DIAGNOSIS — G8929 Other chronic pain: Secondary | ICD-10-CM | POA: Diagnosis not present

## 2017-05-11 DIAGNOSIS — M25552 Pain in left hip: Secondary | ICD-10-CM | POA: Diagnosis not present

## 2017-05-11 DIAGNOSIS — M545 Low back pain: Secondary | ICD-10-CM | POA: Diagnosis not present

## 2017-05-19 DIAGNOSIS — M545 Low back pain: Secondary | ICD-10-CM | POA: Diagnosis not present

## 2017-05-19 DIAGNOSIS — E8941 Symptomatic postprocedural ovarian failure: Secondary | ICD-10-CM | POA: Diagnosis not present

## 2017-05-19 DIAGNOSIS — R5382 Chronic fatigue, unspecified: Secondary | ICD-10-CM | POA: Diagnosis not present

## 2017-05-19 DIAGNOSIS — G8929 Other chronic pain: Secondary | ICD-10-CM | POA: Diagnosis not present

## 2017-05-19 DIAGNOSIS — G4700 Insomnia, unspecified: Secondary | ICD-10-CM | POA: Diagnosis not present

## 2017-05-28 ENCOUNTER — Other Ambulatory Visit: Payer: Self-pay | Admitting: Physician Assistant

## 2017-05-29 ENCOUNTER — Other Ambulatory Visit: Payer: Self-pay | Admitting: Physician Assistant

## 2017-06-02 DIAGNOSIS — R5383 Other fatigue: Secondary | ICD-10-CM | POA: Diagnosis not present

## 2017-06-02 DIAGNOSIS — G8929 Other chronic pain: Secondary | ICD-10-CM | POA: Diagnosis not present

## 2017-06-02 DIAGNOSIS — M549 Dorsalgia, unspecified: Secondary | ICD-10-CM | POA: Diagnosis not present

## 2017-06-02 DIAGNOSIS — G4700 Insomnia, unspecified: Secondary | ICD-10-CM | POA: Diagnosis not present

## 2017-06-09 DIAGNOSIS — Z79899 Other long term (current) drug therapy: Secondary | ICD-10-CM | POA: Diagnosis not present

## 2017-06-09 DIAGNOSIS — S99922A Unspecified injury of left foot, initial encounter: Secondary | ICD-10-CM | POA: Diagnosis not present

## 2017-06-09 DIAGNOSIS — S92512A Displaced fracture of proximal phalanx of left lesser toe(s), initial encounter for closed fracture: Secondary | ICD-10-CM | POA: Diagnosis not present

## 2017-06-09 DIAGNOSIS — W2203XA Walked into furniture, initial encounter: Secondary | ICD-10-CM | POA: Diagnosis not present

## 2017-06-09 DIAGNOSIS — Z791 Long term (current) use of non-steroidal anti-inflammatories (NSAID): Secondary | ICD-10-CM | POA: Diagnosis not present

## 2017-06-09 DIAGNOSIS — I10 Essential (primary) hypertension: Secondary | ICD-10-CM | POA: Diagnosis not present

## 2017-06-09 DIAGNOSIS — F329 Major depressive disorder, single episode, unspecified: Secondary | ICD-10-CM | POA: Diagnosis not present

## 2017-06-09 DIAGNOSIS — W228XXA Striking against or struck by other objects, initial encounter: Secondary | ICD-10-CM | POA: Diagnosis not present

## 2017-06-09 DIAGNOSIS — Z888 Allergy status to other drugs, medicaments and biological substances status: Secondary | ICD-10-CM | POA: Diagnosis not present

## 2017-06-09 DIAGNOSIS — Z885 Allergy status to narcotic agent status: Secondary | ICD-10-CM | POA: Diagnosis not present

## 2017-06-09 DIAGNOSIS — Z87891 Personal history of nicotine dependence: Secondary | ICD-10-CM | POA: Diagnosis not present

## 2017-06-09 DIAGNOSIS — J45909 Unspecified asthma, uncomplicated: Secondary | ICD-10-CM | POA: Diagnosis not present

## 2017-06-09 DIAGNOSIS — Z883 Allergy status to other anti-infective agents status: Secondary | ICD-10-CM | POA: Diagnosis not present

## 2017-06-10 DIAGNOSIS — R5383 Other fatigue: Secondary | ICD-10-CM | POA: Diagnosis not present

## 2017-06-10 DIAGNOSIS — G8929 Other chronic pain: Secondary | ICD-10-CM | POA: Diagnosis not present

## 2017-06-10 DIAGNOSIS — G4700 Insomnia, unspecified: Secondary | ICD-10-CM | POA: Diagnosis not present

## 2017-06-10 DIAGNOSIS — M549 Dorsalgia, unspecified: Secondary | ICD-10-CM | POA: Diagnosis not present

## 2017-06-11 ENCOUNTER — Encounter: Payer: Self-pay | Admitting: Sports Medicine

## 2017-06-11 ENCOUNTER — Ambulatory Visit (INDEPENDENT_AMBULATORY_CARE_PROVIDER_SITE_OTHER): Payer: BLUE CROSS/BLUE SHIELD

## 2017-06-11 ENCOUNTER — Ambulatory Visit (INDEPENDENT_AMBULATORY_CARE_PROVIDER_SITE_OTHER): Payer: BLUE CROSS/BLUE SHIELD | Admitting: Sports Medicine

## 2017-06-11 DIAGNOSIS — S99922A Unspecified injury of left foot, initial encounter: Secondary | ICD-10-CM | POA: Diagnosis not present

## 2017-06-11 DIAGNOSIS — J4541 Moderate persistent asthma with (acute) exacerbation: Secondary | ICD-10-CM

## 2017-06-11 DIAGNOSIS — M79672 Pain in left foot: Secondary | ICD-10-CM | POA: Diagnosis not present

## 2017-06-11 DIAGNOSIS — S99921A Unspecified injury of right foot, initial encounter: Secondary | ICD-10-CM

## 2017-06-11 DIAGNOSIS — M79675 Pain in left toe(s): Secondary | ICD-10-CM

## 2017-06-11 DIAGNOSIS — S92502A Displaced unspecified fracture of left lesser toe(s), initial encounter for closed fracture: Secondary | ICD-10-CM | POA: Diagnosis not present

## 2017-06-11 MED ORDER — AZITHROMYCIN 250 MG PO TABS: ORAL_TABLET | ORAL | 0 refills | Status: DC

## 2017-06-11 MED ORDER — ALBUTEROL SULFATE HFA 108 (90 BASE) MCG/ACT IN AERS: 1.0000 | INHALATION_SPRAY | Freq: Four times a day (QID) | RESPIRATORY_TRACT | 1 refills | Status: DC | PRN

## 2017-06-11 MED ORDER — CLARITHROMYCIN 500 MG PO TABS: 500.0000 mg | ORAL_TABLET | Freq: Two times a day (BID) | ORAL | 0 refills | Status: DC

## 2017-06-11 NOTE — Assessment & Plan Note (Addendum)
Mild asthma exacerbation with subacute sinusitis. Avoiding prednisone now due to her healing fracture, adding clarithromycin, refilling her albuterol. Keep close follow-up with PCP.
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
## 2017-06-12 ENCOUNTER — Telehealth: Payer: Self-pay | Admitting: Sports Medicine

## 2017-06-12 MED ORDER — DOXYCYCLINE HYCLATE 100 MG PO TABS: 100.0000 mg | ORAL_TABLET | Freq: Two times a day (BID) | ORAL | 0 refills | Status: AC

## 2017-06-12 NOTE — Telephone Encounter (Signed)
Lort.  Lets try Doxy.  She should follow up with [facility] for all further primary care issues, I will only treat her neuromusculoskeletal ailments.

## 2017-06-12 NOTE — Telephone Encounter (Signed)
This is normal with Biaxin, ignore it and push through.

## 2017-06-12 NOTE — Telephone Encounter (Signed)
Pt called clinic today stating the antibiotic she started yesterday is causing her to have "really bad stomach cramps." Pt reports she has been taking the Rx with food, denies any nausea. Requesting new Rx to [facility] on file. Will route.

## 2017-06-12 NOTE — Telephone Encounter (Signed)
Pt advised of recommendation. She is refusing to take the Rx and requesting something different. Will route.

## 2017-06-15 NOTE — Telephone Encounter (Signed)
Spoke with Pt, she was able to get the Doxy Rx. She is taking it and tolerating it much better. Advised her to contact clinic if she is still symptomatic after completion. Verbalized understanding.

## 2017-06-26 ENCOUNTER — Other Ambulatory Visit: Payer: Self-pay | Admitting: Sports Medicine

## 2017-06-26 ENCOUNTER — Ambulatory Visit: Payer: Self-pay | Admitting: Physician Assistant

## 2017-07-07 ENCOUNTER — Encounter: Payer: Self-pay | Admitting: Physician Assistant

## 2017-07-07 ENCOUNTER — Ambulatory Visit (INDEPENDENT_AMBULATORY_CARE_PROVIDER_SITE_OTHER): Payer: BLUE CROSS/BLUE SHIELD | Admitting: Physician Assistant

## 2017-07-07 VITALS — BP 134/70 | HR 108 | Temp 99.1°F | Ht 68.5 in | Wt 271.0 lb

## 2017-07-07 DIAGNOSIS — R05 Cough: Secondary | ICD-10-CM | POA: Diagnosis not present

## 2017-07-07 DIAGNOSIS — R52 Pain, unspecified: Secondary | ICD-10-CM | POA: Diagnosis not present

## 2017-07-07 DIAGNOSIS — R509 Fever, unspecified: Secondary | ICD-10-CM | POA: Diagnosis not present

## 2017-07-07 DIAGNOSIS — R059 Cough, unspecified: Secondary | ICD-10-CM

## 2017-07-07 DIAGNOSIS — R69 Illness, unspecified: Secondary | ICD-10-CM | POA: Diagnosis not present

## 2017-07-07 DIAGNOSIS — J111 Influenza due to unidentified influenza virus with other respiratory manifestations: Secondary | ICD-10-CM

## 2017-07-07 LAB — POCT INFLUENZA A/B
Influenza A, POC: NEGATIVE
Influenza B, POC: NEGATIVE

## 2017-07-07 MED ORDER — HYDROCODONE-HOMATROPINE 5-1.5 MG/5ML PO SYRP: 2.5000 mL | ORAL_SOLUTION | Freq: Two times a day (BID) | ORAL | 0 refills | Status: DC | PRN

## 2017-07-07 MED ORDER — OSELTAMIVIR PHOSPHATE 75 MG PO CAPS: 75.0000 mg | ORAL_CAPSULE | Freq: Two times a day (BID) | ORAL | 0 refills | Status: DC
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
## 2017-07-07 NOTE — Patient Instructions (Signed)

## 2017-07-09 ENCOUNTER — Other Ambulatory Visit: Payer: Self-pay

## 2017-07-09 ENCOUNTER — Emergency Department (HOSPITAL_BASED_OUTPATIENT_CLINIC_OR_DEPARTMENT_OTHER): Payer: BLUE CROSS/BLUE SHIELD

## 2017-07-09 ENCOUNTER — Emergency Department (HOSPITAL_BASED_OUTPATIENT_CLINIC_OR_DEPARTMENT_OTHER)
Admission: EM | Admit: 2017-07-09 | Discharge: 2017-07-10 | Disposition: A | Discharge status: Home / Self Care | Payer: BLUE CROSS/BLUE SHIELD | Attending: Emergency Medicine | Admitting: Emergency Medicine

## 2017-07-09 ENCOUNTER — Ambulatory Visit (INDEPENDENT_AMBULATORY_CARE_PROVIDER_SITE_OTHER): Payer: BLUE CROSS/BLUE SHIELD | Admitting: Family Medicine

## 2017-07-09 ENCOUNTER — Encounter (HOSPITAL_BASED_OUTPATIENT_CLINIC_OR_DEPARTMENT_OTHER): Payer: Self-pay | Admitting: *Deleted

## 2017-07-09 ENCOUNTER — Encounter: Payer: Self-pay | Admitting: Family Medicine

## 2017-07-09 VITALS — BP 133/67 | HR 97 | Temp 99.1°F

## 2017-07-09 DIAGNOSIS — M25551 Pain in right hip: Secondary | ICD-10-CM | POA: Diagnosis not present

## 2017-07-09 DIAGNOSIS — J45909 Unspecified asthma, uncomplicated: Secondary | ICD-10-CM | POA: Insufficient documentation

## 2017-07-09 DIAGNOSIS — M1611 Unilateral primary osteoarthritis, right hip: Secondary | ICD-10-CM | POA: Insufficient documentation

## 2017-07-09 DIAGNOSIS — I10 Essential (primary) hypertension: Secondary | ICD-10-CM | POA: Diagnosis not present

## 2017-07-09 DIAGNOSIS — Z79899 Other long term (current) drug therapy: Secondary | ICD-10-CM | POA: Insufficient documentation

## 2017-07-09 DIAGNOSIS — M25559 Pain in unspecified hip: Secondary | ICD-10-CM

## 2017-07-09 DIAGNOSIS — M549 Dorsalgia, unspecified: Secondary | ICD-10-CM | POA: Diagnosis not present

## 2017-07-09 DIAGNOSIS — Z87891 Personal history of nicotine dependence: Secondary | ICD-10-CM | POA: Insufficient documentation

## 2017-07-09 DIAGNOSIS — M1990 Unspecified osteoarthritis, unspecified site: Secondary | ICD-10-CM

## 2017-07-09 DIAGNOSIS — R200 Anesthesia of skin: Secondary | ICD-10-CM | POA: Diagnosis not present

## 2017-07-09 LAB — CBC WITH DIFFERENTIAL/PLATELET
Basophils Absolute: 0 10*3/uL (ref 0.0–0.1)
Basophils Relative: 0 %
EOS ABS: 0.1 10*3/uL (ref 0.0–0.7)
Eosinophils Relative: 1 %
HEMATOCRIT: 39.7 % (ref 36.0–46.0)
HEMOGLOBIN: 13.3 g/dL (ref 12.0–15.0)
LYMPHS ABS: 3.4 10*3/uL (ref 0.7–4.0)
LYMPHS PCT: 38 %
MCH: 30.4 pg (ref 26.0–34.0)
MCHC: 33.5 g/dL (ref 30.0–36.0)
MCV: 90.8 fL (ref 78.0–100.0)
Monocytes Absolute: 1 10*3/uL (ref 0.1–1.0)
Monocytes Relative: 11 %
NEUTROS ABS: 4.4 10*3/uL (ref 1.7–7.7)
NEUTROS PCT: 50 %
Platelets: 164 10*3/uL (ref 150–400)
RBC: 4.37 MIL/uL (ref 3.87–5.11)
RDW: 14.3 % (ref 11.5–15.5)
WBC: 8.9 10*3/uL (ref 4.0–10.5)

## 2017-07-09 LAB — BASIC METABOLIC PANEL
Anion gap: 8 (ref 5–15)
BUN: 10 mg/dL (ref 6–20)
CHLORIDE: 104 mmol/L (ref 101–111)
CO2: 22 mmol/L (ref 22–32)
Calcium: 8.5 mg/dL — ABNORMAL LOW (ref 8.9–10.3)
Creatinine, Ser: 0.8 mg/dL (ref 0.44–1.00)
GFR calc Af Amer: >60 mL/min (ref >60)
GFR calc non Af Amer: >60 mL/min (ref >60)
Glucose, Bld: 101 mg/dL — ABNORMAL HIGH (ref 65–99)
POTASSIUM: 2.9 mmol/L — AB (ref 3.5–5.1)
SODIUM: 134 mmol/L — AB (ref 135–145)

## 2017-07-09 LAB — C-REACTIVE PROTEIN: CRP: 2.3 mg/dL — ABNORMAL HIGH (ref <1.0)

## 2017-07-09 LAB — SEDIMENTATION RATE: SED RATE: 10 mm/h (ref 0–22)

## 2017-07-09 MED ORDER — POTASSIUM CHLORIDE CRYS ER 20 MEQ PO TBCR
40.0000 meq | EXTENDED_RELEASE_TABLET | Freq: Once | ORAL | Status: AC
  Administered 2017-07-09: 40 meq via ORAL
  Filled 2017-07-09: qty 2

## 2017-07-09 MED ORDER — HYDROMORPHONE HCL 1 MG/ML IJ SOLN
1.0000 mg | Freq: Once | INTRAMUSCULAR | Status: AC
  Administered 2017-07-09: 1 mg via INTRAVENOUS
  Filled 2017-07-09: qty 1

## 2017-07-09 MED ORDER — MORPHINE SULFATE (PF) 4 MG/ML IV SOLN: 4.0000 mg | Freq: Once | INTRAVENOUS | Status: DC

## 2017-07-09 MED ORDER — POTASSIUM CHLORIDE CRYS ER 20 MEQ PO TBCR: 40.0000 meq | EXTENDED_RELEASE_TABLET | Freq: Once | ORAL | Status: DC

## 2017-07-09 NOTE — ED Notes (Signed)
EDP at bedside  
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
## 2017-07-09 NOTE — ED Triage Notes (Signed)
Right leg pain since this am. She was seen at her MD and he tried to aspirate fluid without success. She was told to come here.
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
## 2017-07-09 NOTE — ED Notes (Signed)
Pt. Was seen by a Dr. Today and had hip joint tapped per Pt.   Was sent here and is here now for admission she said.

## 2017-07-09 NOTE — Progress Notes (Signed)
Attempted aspiration of right effusion:  Large right hip effusion seen on [facility] ultrasound indicating possible septic hip as a cause of pain.   Procedure: Real-time Ultrasound Guided Aspiration of right hip joint  Device: GE Logiq E   Images permanently stored and available for review in the ultrasound unit. Verbal informed consent obtained.  Discussed risks and benefits of procedure. Warned about infection bleeding damage to structures skin hypopigmentation and fat atrophy among others. Patient expresses understanding and agreement Time-out conducted.   Noted no overlying erythema, induration, or other signs of local infection.   Skin prepped in a sterile fashion.   Local anesthesia: Topical Ethyl chloride.   With sterile technique and under real time ultrasound guidance:  76m lidocaine injected along the planned aspiration tract using a 21 gauge spinal needle.  The skin was then cleaned again with alcohol.  A 18 gauge needle was then inserted in to the skin to the hip effusion.  The procedure was discontinued as Ms SGrayswas very painful and moving during the exam. I do not think I can do the procedure safely given her current pain control.   I recommend Tx to ED for better pain control prior to diagnostic aspiration of a hip effusion.     ELynne Leader MD  07/09/17 5:32 PM

## 2017-07-09 NOTE — ED Notes (Signed)
Pt. Just returned from radiology

## 2017-07-10 ENCOUNTER — Telehealth: Payer: Self-pay | Admitting: Physician Assistant

## 2017-07-10 ENCOUNTER — Emergency Department (HOSPITAL_COMMUNITY): Payer: BLUE CROSS/BLUE SHIELD

## 2017-07-10 DIAGNOSIS — M25551 Pain in right hip: Secondary | ICD-10-CM | POA: Diagnosis not present

## 2017-07-10 LAB — CBC WITH DIFFERENTIAL/PLATELET
BASOS ABS: 41 {cells}/uL (ref 0–200)
Basophils Relative: 0.5 %
EOS PCT: 1.4 %
Eosinophils Absolute: 113 cells/uL (ref 15–500)
HEMATOCRIT: 40.1 % (ref 35.0–45.0)
HEMOGLOBIN: 14 g/dL (ref 11.7–15.5)
LYMPHS ABS: 3475 {cells}/uL (ref 850–3900)
MCH: 30 pg (ref 27.0–33.0)
MCHC: 34.9 g/dL (ref 32.0–36.0)
MCV: 86.1 fL (ref 80.0–100.0)
MPV: 11.9 fL (ref 7.5–12.5)
Monocytes Relative: 8.4 %
NEUTROS ABS: 3791 {cells}/uL (ref 1500–7800)
NEUTROS PCT: 46.8 %
Platelets: 151 10*3/uL (ref 140–400)
RBC: 4.66 10*6/uL (ref 3.80–5.10)
RDW: 13 % (ref 11.0–15.0)
Total Lymphocyte: 42.9 %
WBC: 8.1 10*3/uL (ref 3.8–10.8)
WBCMIX: 680 {cells}/uL (ref 200–950)

## 2017-07-10 LAB — SYNOVIAL CELL COUNT + DIFF, W/ CRYSTALS
Crystals, Fluid: NONE SEEN
Eosinophils-Synovial: NONE SEEN % (ref 0–1)
Lymphocytes-Synovial Fld: 1 % (ref 0–20)
Monocyte-Macrophage-Synovial Fluid: 3 % — ABNORMAL LOW (ref 50–90)
Neutrophil, Synovial: 96 % — ABNORMAL HIGH (ref 0–25)
WBC, Synovial: 27000 /mm3 — ABNORMAL HIGH (ref 0–200)

## 2017-07-10 LAB — SEDIMENTATION RATE: Sed Rate: 9 mm/h (ref 0–20)

## 2017-07-10 LAB — C-REACTIVE PROTEIN: CRP: 14.1 mg/L — ABNORMAL HIGH (ref <8.0)

## 2017-07-10 MED ORDER — HYDROMORPHONE HCL 1 MG/ML IJ SOLN
0.5000 mg | Freq: Once | INTRAMUSCULAR | Status: AC
  Administered 2017-07-10: 0.5 mg via INTRAVENOUS
  Filled 2017-07-10: qty 1

## 2017-07-10 MED ORDER — MORPHINE SULFATE (PF) 4 MG/ML IV SOLN
4.0000 mg | Freq: Once | INTRAVENOUS | Status: AC
  Administered 2017-07-10: 4 mg via INTRAVENOUS
  Filled 2017-07-10: qty 1

## 2017-07-10 MED ORDER — SODIUM CHLORIDE 0.9 % IJ SOLN
INTRAMUSCULAR | Status: AC
  Filled 2017-07-10: qty 20

## 2017-07-10 MED ORDER — LIDOCAINE HCL (PF) 1 % IJ SOLN
INTRAMUSCULAR | Status: AC
  Filled 2017-07-10: qty 5

## 2017-07-10 MED ORDER — LIDOCAINE HCL (PF) 1 % IJ SOLN
5.0000 mL | Freq: Once | INTRAMUSCULAR | Status: AC
  Administered 2017-07-10: 5 mL via INTRADERMAL

## 2017-07-10 MED ORDER — TRAMADOL HCL 50 MG PO TABS: 50.0000 mg | ORAL_TABLET | Freq: Four times a day (QID) | ORAL | 0 refills | Status: DC | PRN

## 2017-07-10 MED ORDER — IOPAMIDOL (ISOVUE-M 200) INJECTION 41%
INTRAMUSCULAR | Status: AC
  Filled 2017-07-10: qty 10

## 2017-07-10 NOTE — ED Notes (Signed)
Patient is from [facility], was given hydromorphone 83m PTLeaving [facility].  Patient is able to

## 2017-07-10 NOTE — Telephone Encounter (Signed)
Patient's husband called back to adv that pt is still at [facility] and they have just drained fluids. They are awaiting the results. Thanks

## 2017-07-10 NOTE — ED Provider Notes (Signed)
Signed out this AM by Dr Stark Jock that patient had seen her doctor who wanted arthrocentesis, and that it was arranged with IR to do this AM.   Results return, 27K wbc, 96% pmn. Discussed results with orthopedics on call, Dr Ninfa Linden, as well as temp/vitals, wbc, esr, crp, imaging, etc - he indicates feel is inflammatory in nature and that does not require surgery/wash out/abx/etc.  He indicates to have f/u as outpt.  Recheck pt, w slow passive rom, no severe pain in hip. Afebrile. Pain improved w meds. Discussed results and need for close f/u.  Pt indicates she can f/u closely with her doctor. Will have f/u Monday. Return precautions if fevers, or if cx results positive - to return to ED.       Lajean Saver, MD 07/10/17 1300

## 2017-07-10 NOTE — ED Notes (Signed)
Patient transported to X-ray 

## 2017-07-10 NOTE — Discharge Instructions (Signed)
It was our pleasure to provide your ER care today - we hope that you feel better.  Take your mobic as need for pain.  You may also take ultram as need for pain - no driving for the next 8 hours, or if/when taking ultram.  Follow up with your doctor/orthopedist this Monday - call office today to arrange follow up appointment.  Make sure they follow up on your culture results then.   Return to ER if worse, new symptoms, fevers, intractable pain, other concern.

## 2017-07-11 ENCOUNTER — Emergency Department (HOSPITAL_BASED_OUTPATIENT_CLINIC_OR_DEPARTMENT_OTHER): Payer: BLUE CROSS/BLUE SHIELD

## 2017-07-11 ENCOUNTER — Emergency Department (HOSPITAL_BASED_OUTPATIENT_CLINIC_OR_DEPARTMENT_OTHER)
Admission: EM | Admit: 2017-07-11 | Discharge: 2017-07-11 | Disposition: A | Discharge status: Home / Self Care | Payer: BLUE CROSS/BLUE SHIELD | Attending: Emergency Medicine | Admitting: Emergency Medicine

## 2017-07-11 ENCOUNTER — Encounter (HOSPITAL_BASED_OUTPATIENT_CLINIC_OR_DEPARTMENT_OTHER): Payer: Self-pay

## 2017-07-11 ENCOUNTER — Other Ambulatory Visit: Payer: Self-pay

## 2017-07-11 DIAGNOSIS — M25551 Pain in right hip: Secondary | ICD-10-CM | POA: Insufficient documentation

## 2017-07-11 DIAGNOSIS — G8929 Other chronic pain: Secondary | ICD-10-CM | POA: Insufficient documentation

## 2017-07-11 DIAGNOSIS — J45909 Unspecified asthma, uncomplicated: Secondary | ICD-10-CM | POA: Diagnosis not present

## 2017-07-11 DIAGNOSIS — Z87891 Personal history of nicotine dependence: Secondary | ICD-10-CM | POA: Insufficient documentation

## 2017-07-11 DIAGNOSIS — Z79899 Other long term (current) drug therapy: Secondary | ICD-10-CM | POA: Insufficient documentation

## 2017-07-11 DIAGNOSIS — I10 Essential (primary) hypertension: Secondary | ICD-10-CM | POA: Diagnosis not present

## 2017-07-11 DIAGNOSIS — S32311A Displaced avulsion fracture of right ilium, initial encounter for closed fracture: Secondary | ICD-10-CM | POA: Diagnosis not present

## 2017-07-11 LAB — PROTEIN, BODY FLUID (OTHER): Total Protein, Body Fluid Other: 4.6 g/dL

## 2017-07-11 LAB — CBC WITH DIFFERENTIAL/PLATELET
BASOS ABS: 0 10*3/uL (ref 0.0–0.1)
Basophils Relative: 0 %
EOS ABS: 0.1 10*3/uL (ref 0.0–0.7)
EOS PCT: 1 %
HCT: 37.4 % (ref 36.0–46.0)
Hemoglobin: 12.2 g/dL (ref 12.0–15.0)
Lymphocytes Relative: 40 %
Lymphs Abs: 3.2 10*3/uL (ref 0.7–4.0)
MCH: 30.3 pg (ref 26.0–34.0)
MCHC: 32.6 g/dL (ref 30.0–36.0)
MCV: 92.8 fL (ref 78.0–100.0)
Monocytes Absolute: 0.9 10*3/uL (ref 0.1–1.0)
Monocytes Relative: 11 %
Neutro Abs: 3.7 10*3/uL (ref 1.7–7.7)
Neutrophils Relative %: 48 %
PLATELETS: 136 10*3/uL — AB (ref 150–400)
RBC: 4.03 MIL/uL (ref 3.87–5.11)
RDW: 14 % (ref 11.5–15.5)
WBC: 7.9 10*3/uL (ref 4.0–10.5)

## 2017-07-11 LAB — BASIC METABOLIC PANEL
ANION GAP: 9 (ref 5–15)
BUN: 9 mg/dL (ref 6–20)
CALCIUM: 8.5 mg/dL — AB (ref 8.9–10.3)
CO2: 22 mmol/L (ref 22–32)
Chloride: 105 mmol/L (ref 101–111)
Creatinine, Ser: 0.63 mg/dL (ref 0.44–1.00)
GFR calc Af Amer: >60 mL/min (ref >60)
GFR calc non Af Amer: >60 mL/min (ref >60)
Glucose, Bld: 116 mg/dL — ABNORMAL HIGH (ref 65–99)
POTASSIUM: 3.6 mmol/L (ref 3.5–5.1)
SODIUM: 136 mmol/L (ref 135–145)

## 2017-07-11 LAB — GLUCOSE, BODY FLUID OTHER: GLUCOSE, BODY FLUID OTHER: 7 mg/dL

## 2017-07-11 LAB — C-REACTIVE PROTEIN: CRP: 6.5 mg/dL — ABNORMAL HIGH (ref <1.0)

## 2017-07-11 LAB — SEDIMENTATION RATE: SED RATE: 22 mm/h (ref 0–22)

## 2017-07-11 MED ORDER — KETOROLAC TROMETHAMINE 15 MG/ML IJ SOLN
15.0000 mg | Freq: Once | INTRAMUSCULAR | Status: AC
  Administered 2017-07-11: 15 mg via INTRAVENOUS
  Filled 2017-07-11: qty 1

## 2017-07-11 MED ORDER — HYDROCODONE-ACETAMINOPHEN 5-325 MG PO TABS: 1.0000 | ORAL_TABLET | ORAL | 0 refills | Status: DC | PRN

## 2017-07-11 MED ORDER — FENTANYL CITRATE (PF) 100 MCG/2ML IJ SOLN
100.0000 ug | Freq: Once | INTRAMUSCULAR | Status: AC
  Administered 2017-07-11: 100 ug via INTRAVENOUS
  Filled 2017-07-11: qty 2

## 2017-07-11 MED ORDER — HYDROCODONE-ACETAMINOPHEN 5-325 MG PO TABS
1.0000 | ORAL_TABLET | Freq: Once | ORAL | Status: AC
  Administered 2017-07-11: 1 via ORAL
  Filled 2017-07-11: qty 1

## 2017-07-11 MED ORDER — DIPHENHYDRAMINE HCL 25 MG PO CAPS
25.0000 mg | ORAL_CAPSULE | Freq: Once | ORAL | Status: AC
Start: 2017-07-11 — End: 2017-07-11
  Administered 2017-07-11: 25 mg via ORAL
  Filled 2017-07-11: qty 1

## 2017-07-11 NOTE — ED Notes (Signed)
Patient transported to X-ray 
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
## 2017-07-11 NOTE — ED Notes (Signed)
ED Provider at bedside. 

## 2017-07-11 NOTE — ED Triage Notes (Signed)
Pt presents with increased right hip pain, pt seen yesterday at [facility] and sent to [facility] to aspirate fluid. Pt reports fevers and increased pain since. Pt spoke with PCP and told to come to ER. Pt to triage in wheelchair.

## 2017-07-13 ENCOUNTER — Telehealth: Payer: Self-pay | Admitting: Physician Assistant

## 2017-07-13 DIAGNOSIS — M25551 Pain in right hip: Secondary | ICD-10-CM | POA: Diagnosis not present

## 2017-07-13 LAB — BODY FLUID CULTURE: CULTURE: NO GROWTH

## 2017-07-13 NOTE — Telephone Encounter (Signed)
FYI: Patient wants you to know that she had to be seen by an orthopedist today and she has a fractured hip

## 2017-07-14 ENCOUNTER — Other Ambulatory Visit: Payer: Self-pay

## 2017-07-14 ENCOUNTER — Encounter (HOSPITAL_BASED_OUTPATIENT_CLINIC_OR_DEPARTMENT_OTHER): Payer: Self-pay | Admitting: *Deleted

## 2017-07-14 DIAGNOSIS — Z5321 Procedure and treatment not carried out due to patient leaving prior to being seen by health care provider: Secondary | ICD-10-CM | POA: Diagnosis not present

## 2017-07-14 DIAGNOSIS — M25551 Pain in right hip: Secondary | ICD-10-CM | POA: Diagnosis not present

## 2017-07-14 LAB — CULTURE, BLOOD (ROUTINE X 2)
CULTURE: NO GROWTH
Culture: NO GROWTH
Special Requests: ADEQUATE
Special Requests: ADEQUATE

## 2017-07-14 NOTE — ED Triage Notes (Signed)
Right hip fracture-seen and dx at [facility]. States that the norco is not helping her pain.  Triage in wheelchair.

## 2017-07-15 ENCOUNTER — Emergency Department (HOSPITAL_BASED_OUTPATIENT_CLINIC_OR_DEPARTMENT_OTHER)
Admission: EM | Admit: 2017-07-15 | Discharge: 2017-07-15 | Disposition: A | Discharge status: Home / Self Care | Payer: BLUE CROSS/BLUE SHIELD | Attending: Emergency Medicine | Admitting: Emergency Medicine

## 2017-07-15 DIAGNOSIS — M25551 Pain in right hip: Secondary | ICD-10-CM | POA: Diagnosis not present

## 2017-07-15 DIAGNOSIS — Z79899 Other long term (current) drug therapy: Secondary | ICD-10-CM | POA: Diagnosis not present

## 2017-07-15 DIAGNOSIS — Z883 Allergy status to other anti-infective agents status: Secondary | ICD-10-CM | POA: Diagnosis not present

## 2017-07-15 DIAGNOSIS — I10 Essential (primary) hypertension: Secondary | ICD-10-CM | POA: Diagnosis not present

## 2017-07-15 DIAGNOSIS — J45909 Unspecified asthma, uncomplicated: Secondary | ICD-10-CM | POA: Diagnosis not present

## 2017-07-15 DIAGNOSIS — Z87891 Personal history of nicotine dependence: Secondary | ICD-10-CM | POA: Diagnosis not present

## 2017-07-15 DIAGNOSIS — Z888 Allergy status to other drugs, medicaments and biological substances status: Secondary | ICD-10-CM | POA: Diagnosis not present

## 2017-07-15 DIAGNOSIS — F329 Major depressive disorder, single episode, unspecified: Secondary | ICD-10-CM | POA: Diagnosis not present

## 2017-07-15 DIAGNOSIS — Z885 Allergy status to narcotic agent status: Secondary | ICD-10-CM | POA: Diagnosis not present

## 2017-07-15 DIAGNOSIS — M1991 Primary osteoarthritis, unspecified site: Secondary | ICD-10-CM | POA: Diagnosis not present

## 2017-07-15 DIAGNOSIS — G629 Polyneuropathy, unspecified: Secondary | ICD-10-CM | POA: Diagnosis not present

## 2017-07-15 NOTE — ED Notes (Signed)
Pt informed registration they were leaving

## 2017-07-16 ENCOUNTER — Ambulatory Visit (HOSPITAL_COMMUNITY): Payer: Self-pay | Admitting: Medical

## 2017-07-27 DIAGNOSIS — M25551 Pain in right hip: Secondary | ICD-10-CM | POA: Diagnosis not present

## 2017-07-27 DIAGNOSIS — M25561 Pain in right knee: Secondary | ICD-10-CM | POA: Diagnosis not present

## 2017-07-27 DIAGNOSIS — M25562 Pain in left knee: Secondary | ICD-10-CM | POA: Diagnosis not present

## 2017-07-28 ENCOUNTER — Other Ambulatory Visit: Payer: Self-pay | Admitting: Physician Assistant

## 2017-07-28 DIAGNOSIS — R600 Localized edema: Secondary | ICD-10-CM

## 2017-07-30 DIAGNOSIS — G4700 Insomnia, unspecified: Secondary | ICD-10-CM | POA: Diagnosis not present

## 2017-07-30 DIAGNOSIS — S72001A Fracture of unspecified part of neck of right femur, initial encounter for closed fracture: Secondary | ICD-10-CM | POA: Diagnosis not present

## 2017-07-30 DIAGNOSIS — R5382 Chronic fatigue, unspecified: Secondary | ICD-10-CM | POA: Diagnosis not present

## 2017-07-30 DIAGNOSIS — E6609 Other obesity due to excess calories: Secondary | ICD-10-CM | POA: Diagnosis not present

## 2017-08-10 DIAGNOSIS — M25551 Pain in right hip: Secondary | ICD-10-CM | POA: Diagnosis not present

## 2017-08-20 DIAGNOSIS — G629 Polyneuropathy, unspecified: Secondary | ICD-10-CM | POA: Diagnosis not present

## 2017-08-20 DIAGNOSIS — M25559 Pain in unspecified hip: Secondary | ICD-10-CM | POA: Diagnosis not present

## 2017-08-20 DIAGNOSIS — M549 Dorsalgia, unspecified: Secondary | ICD-10-CM | POA: Diagnosis not present

## 2017-08-20 DIAGNOSIS — M542 Cervicalgia: Secondary | ICD-10-CM | POA: Diagnosis not present

## 2017-08-27 ENCOUNTER — Other Ambulatory Visit: Payer: Self-pay | Admitting: Physician Assistant

## 2017-08-27 DIAGNOSIS — R600 Localized edema: Secondary | ICD-10-CM

## 2017-08-28 ENCOUNTER — Other Ambulatory Visit: Payer: Self-pay | Admitting: Physician Assistant

## 2017-08-31 ENCOUNTER — Other Ambulatory Visit: Payer: Self-pay | Admitting: Physician Assistant

## 2017-09-07 DIAGNOSIS — M25551 Pain in right hip: Secondary | ICD-10-CM | POA: Diagnosis not present

## 2017-09-14 ENCOUNTER — Ambulatory Visit: Payer: Self-pay | Admitting: Physical Therapy

## 2017-09-14 ENCOUNTER — Ambulatory Visit: Payer: Self-pay | Admitting: Rehabilitative and Restorative Service Providers"

## 2017-09-16 ENCOUNTER — Ambulatory Visit: Payer: BLUE CROSS/BLUE SHIELD | Admitting: Physical Therapy

## 2017-09-16 DIAGNOSIS — G8929 Other chronic pain: Secondary | ICD-10-CM | POA: Diagnosis not present

## 2017-09-16 DIAGNOSIS — M25511 Pain in right shoulder: Secondary | ICD-10-CM | POA: Diagnosis not present

## 2017-09-16 DIAGNOSIS — M25512 Pain in left shoulder: Secondary | ICD-10-CM | POA: Diagnosis not present

## 2017-09-16 DIAGNOSIS — M545 Low back pain: Secondary | ICD-10-CM | POA: Diagnosis not present

## 2017-09-17 ENCOUNTER — Ambulatory Visit: Payer: BLUE CROSS/BLUE SHIELD | Attending: Orthopaedic Surgery | Admitting: Physical Therapy

## 2017-09-17 ENCOUNTER — Encounter: Payer: Self-pay | Admitting: Physical Therapy

## 2017-09-17 DIAGNOSIS — M25651 Stiffness of right hip, not elsewhere classified: Secondary | ICD-10-CM | POA: Diagnosis not present

## 2017-09-17 DIAGNOSIS — R29898 Other symptoms and signs involving the musculoskeletal system: Secondary | ICD-10-CM

## 2017-09-17 DIAGNOSIS — R2689 Other abnormalities of gait and mobility: Secondary | ICD-10-CM | POA: Diagnosis not present

## 2017-09-17 DIAGNOSIS — M25551 Pain in right hip: Secondary | ICD-10-CM | POA: Insufficient documentation
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
## 2017-09-17 NOTE — Patient Instructions (Signed)
Strengthening: Straight Leg Raise (Phase 1)   Tighten muscles on front of right thigh, then lift leg __~8__ inches from surface, keeping knee locked.  Repeat __15__ times per set. Do _2___ sets per session.   Bridge   Lie back, legs bent. Inhale, pressing hips up. Keeping ribs in, lengthen lower back. Exhale, rolling down along spine from top. Repeat _15___ times. Do __2__ sessions per day.  Hip Abduction / Adduction: with Extended Knee (Supine)   Bring left leg out to side and return. Keep knee straight. Repeat __15__ times per set. Do __2__ sets per session.   Knee to Chest   Lying supine, bend involved knee to chest _5-10__ times.  Internal Hip Rotation (Prone)   Abdomen supported, bend left knee and slowly lower leg out until other hip starts to rise. Keep stomach tight. Repeat __10-15__ times per set. Do __2__ sets per session.

## 2017-09-23 ENCOUNTER — Ambulatory Visit: Payer: BLUE CROSS/BLUE SHIELD | Admitting: Physical Therapy

## 2017-09-23 ENCOUNTER — Encounter: Payer: Self-pay | Admitting: Physical Therapy

## 2017-09-23 DIAGNOSIS — R2689 Other abnormalities of gait and mobility: Secondary | ICD-10-CM | POA: Diagnosis not present

## 2017-09-23 DIAGNOSIS — M25551 Pain in right hip: Secondary | ICD-10-CM | POA: Diagnosis not present

## 2017-09-23 DIAGNOSIS — R29898 Other symptoms and signs involving the musculoskeletal system: Secondary | ICD-10-CM | POA: Diagnosis not present

## 2017-09-23 DIAGNOSIS — M25651 Stiffness of right hip, not elsewhere classified: Secondary | ICD-10-CM | POA: Diagnosis not present
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
## 2017-09-24 ENCOUNTER — Ambulatory Visit: Payer: Self-pay | Admitting: Physical Therapy

## 2017-09-29 ENCOUNTER — Other Ambulatory Visit: Payer: Self-pay | Admitting: Physician Assistant

## 2017-10-05 ENCOUNTER — Other Ambulatory Visit: Payer: Self-pay | Admitting: Physician Assistant

## 2017-10-05 DIAGNOSIS — R600 Localized edema: Secondary | ICD-10-CM

## 2017-10-06 ENCOUNTER — Encounter: Payer: Self-pay | Admitting: Physical Therapy

## 2017-10-06 ENCOUNTER — Ambulatory Visit: Payer: BLUE CROSS/BLUE SHIELD | Attending: Orthopaedic Surgery | Admitting: Physical Therapy

## 2017-10-06 DIAGNOSIS — M25651 Stiffness of right hip, not elsewhere classified: Secondary | ICD-10-CM | POA: Diagnosis not present

## 2017-10-06 DIAGNOSIS — R29898 Other symptoms and signs involving the musculoskeletal system: Secondary | ICD-10-CM

## 2017-10-06 DIAGNOSIS — R2689 Other abnormalities of gait and mobility: Secondary | ICD-10-CM

## 2017-10-06 DIAGNOSIS — M25551 Pain in right hip: Secondary | ICD-10-CM
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
## 2017-10-07 DIAGNOSIS — M545 Low back pain: Secondary | ICD-10-CM | POA: Diagnosis not present

## 2017-10-07 DIAGNOSIS — M25551 Pain in right hip: Secondary | ICD-10-CM | POA: Diagnosis not present

## 2017-10-07 DIAGNOSIS — G8929 Other chronic pain: Secondary | ICD-10-CM | POA: Diagnosis not present

## 2017-10-07 DIAGNOSIS — S72001D Fracture of unspecified part of neck of right femur, subsequent encounter for closed fracture with routine healing: Secondary | ICD-10-CM | POA: Diagnosis not present

## 2017-10-07 DIAGNOSIS — E668 Other obesity: Secondary | ICD-10-CM | POA: Diagnosis not present

## 2017-10-12 ENCOUNTER — Encounter (HOSPITAL_BASED_OUTPATIENT_CLINIC_OR_DEPARTMENT_OTHER): Payer: Self-pay

## 2017-10-12 ENCOUNTER — Emergency Department (HOSPITAL_BASED_OUTPATIENT_CLINIC_OR_DEPARTMENT_OTHER): Payer: BLUE CROSS/BLUE SHIELD

## 2017-10-12 ENCOUNTER — Emergency Department (HOSPITAL_BASED_OUTPATIENT_CLINIC_OR_DEPARTMENT_OTHER)
Admission: EM | Admit: 2017-10-12 | Discharge: 2017-10-12 | Disposition: A | Discharge status: Home / Self Care | Payer: BLUE CROSS/BLUE SHIELD | Attending: Emergency Medicine | Admitting: Emergency Medicine

## 2017-10-12 ENCOUNTER — Other Ambulatory Visit: Payer: Self-pay

## 2017-10-12 DIAGNOSIS — Z87891 Personal history of nicotine dependence: Secondary | ICD-10-CM | POA: Diagnosis not present

## 2017-10-12 DIAGNOSIS — J45909 Unspecified asthma, uncomplicated: Secondary | ICD-10-CM | POA: Insufficient documentation

## 2017-10-12 DIAGNOSIS — S79912A Unspecified injury of left hip, initial encounter: Secondary | ICD-10-CM | POA: Diagnosis not present

## 2017-10-12 DIAGNOSIS — I10 Essential (primary) hypertension: Secondary | ICD-10-CM | POA: Insufficient documentation

## 2017-10-12 DIAGNOSIS — M25552 Pain in left hip: Secondary | ICD-10-CM | POA: Diagnosis not present

## 2017-10-12 DIAGNOSIS — Z79899 Other long term (current) drug therapy: Secondary | ICD-10-CM | POA: Insufficient documentation

## 2017-10-12 HISTORY — DX: Bipolar II disorder: F31.81

## 2017-10-12 MED ORDER — METHOCARBAMOL 500 MG PO TABS
ORAL_TABLET | ORAL | Status: AC
  Filled 2017-10-12: qty 1

## 2017-10-12 MED ORDER — MELOXICAM 15 MG PO TABS: 15.0000 mg | ORAL_TABLET | Freq: Every day | ORAL | 0 refills | Status: DC

## 2017-10-12 MED ORDER — ONDANSETRON 4 MG PO TBDP
ORAL_TABLET | ORAL | Status: AC
  Filled 2017-10-12: qty 1

## 2017-10-12 MED ORDER — METHOCARBAMOL 500 MG PO TABS
500.0000 mg | ORAL_TABLET | Freq: Once | ORAL | Status: AC
  Administered 2017-10-12: 500 mg via ORAL

## 2017-10-12 MED ORDER — ONDANSETRON 4 MG PO TBDP
4.0000 mg | ORAL_TABLET | Freq: Once | ORAL | Status: AC
  Administered 2017-10-12: 4 mg via ORAL

## 2017-10-12 MED ORDER — METHOCARBAMOL 500 MG PO TABS: 500.0000 mg | ORAL_TABLET | Freq: Every evening | ORAL | 0 refills | Status: DC | PRN

## 2017-10-12 NOTE — Discharge Instructions (Signed)
Please read and follow all provided instructions.  You have been seen today for left hip pain  Tests performed today include: An x-ray of the affected area - does NOT show any broken bones or dislocations.  Vital signs. See below for your results today.   Home care instructions: -- *PRICE in the first 24-48 hours after injury: Protect (with brace, splint, sling), if given by your provider Rest Ice- Do not apply ice pack directly to your skin, place towel or similar between your skin and ice/ice pack. Apply ice for 20 min, then remove for 40 min while awake Compression- Wear brace, elastic bandage, splint as directed by your provider Elevate affected extremity above the level of your heart when not walking around for the first 24-48 hours   Use Mobic with food as directed.  Take Robaxin at night as needed for additional pain relief. Please note this medication can cause you to be drowsy and should not be used when driving or operating heavy machinery.   Follow-up instructions: Please follow-up with your primary care provider or the provided orthopedic physician (bone specialist) if you continue to have significant pain in 1 week. In this case you may have a more severe injury that requires further care.   Return instructions:  Please return if your toes or feet are numb or tingling, appear gray or blue, or you have severe pain (also elevate the leg and loosen splint or wrap if you were given one) Please return to the Emergency Department if you experience worsening symptoms.  Please return if you have any other emergent concerns. Additional Information:  Your vital signs today were: BP 122/76 (BP Location: Left Arm)    Pulse 92    Temp 99.3 F (37.4 C) (Oral)    Resp 19    Ht 5' 8"  (1.727 m)    Wt 122.9 kg (271 lb)    SpO2 100%    BMI 41.21 kg/m  If your blood pressure (BP) was elevated above 135/85 this visit, please have this repeated by your doctor within one  month. ---------------
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
## 2017-10-12 NOTE — ED Notes (Signed)
Family at bedside. 

## 2017-10-12 NOTE — ED Notes (Signed)
Patient transported to X-ray 

## 2017-10-12 NOTE — ED Notes (Signed)
Pt and family understood dc material and follow up information. Scripts and directors information given at [employer]. NAD noted

## 2017-10-12 NOTE — ED Notes (Signed)
Pt states " robaxin is not a pain med" instructed pt it is a muscle relaxant which will help with pain and this is all the  Provider  Is willing to give at this time , PA made aware , no new orders

## 2017-10-12 NOTE — ED Triage Notes (Signed)
Pt states she had a near fall/"did a split and caught myself on the cabinet" yesterday-pain to left hip-NAD-presents to triage in w/c

## 2017-10-13 ENCOUNTER — Ambulatory Visit: Payer: BLUE CROSS/BLUE SHIELD | Admitting: Physical Therapy

## 2017-10-14 DIAGNOSIS — M25552 Pain in left hip: Secondary | ICD-10-CM | POA: Diagnosis not present

## 2017-10-19 DIAGNOSIS — M25551 Pain in right hip: Secondary | ICD-10-CM | POA: Diagnosis not present

## 2017-10-20 ENCOUNTER — Ambulatory Visit: Payer: BLUE CROSS/BLUE SHIELD | Admitting: Physical Therapy

## 2017-10-22 ENCOUNTER — Encounter (HOSPITAL_COMMUNITY): Payer: Self-pay | Admitting: Medical

## 2017-10-22 ENCOUNTER — Ambulatory Visit (INDEPENDENT_AMBULATORY_CARE_PROVIDER_SITE_OTHER): Payer: BLUE CROSS/BLUE SHIELD | Admitting: Medical

## 2017-10-22 ENCOUNTER — Other Ambulatory Visit: Payer: Self-pay

## 2017-10-22 VITALS — BP 124/78 | HR 84 | Ht 68.0 in | Wt 267.0 lb

## 2017-10-22 DIAGNOSIS — F99 Mental disorder, not otherwise specified: Secondary | ICD-10-CM | POA: Diagnosis not present

## 2017-10-22 DIAGNOSIS — M5136 Other intervertebral disc degeneration, lumbar region: Secondary | ICD-10-CM

## 2017-10-22 DIAGNOSIS — Z639 Problem related to primary support group, unspecified: Secondary | ICD-10-CM | POA: Diagnosis not present

## 2017-10-22 DIAGNOSIS — F3181 Bipolar II disorder: Secondary | ICD-10-CM

## 2017-10-22 DIAGNOSIS — F4312 Post-traumatic stress disorder, chronic: Secondary | ICD-10-CM | POA: Diagnosis not present

## 2017-10-22 DIAGNOSIS — M1610 Unilateral primary osteoarthritis, unspecified hip: Secondary | ICD-10-CM | POA: Diagnosis not present

## 2017-10-22 DIAGNOSIS — G894 Chronic pain syndrome: Secondary | ICD-10-CM

## 2017-10-22 DIAGNOSIS — F4321 Adjustment disorder with depressed mood: Secondary | ICD-10-CM | POA: Diagnosis not present

## 2017-10-22 DIAGNOSIS — F4381 Prolonged grief disorder: Secondary | ICD-10-CM

## 2017-10-22 DIAGNOSIS — F4329 Adjustment disorder with other symptoms: Secondary | ICD-10-CM

## 2017-10-22 DIAGNOSIS — F5105 Insomnia due to other mental disorder: Secondary | ICD-10-CM

## 2017-10-22 DIAGNOSIS — Z6837 Body mass index (BMI) 37.0-37.9, adult: Secondary | ICD-10-CM

## 2017-10-22 DIAGNOSIS — F17200 Nicotine dependence, unspecified, uncomplicated: Secondary | ICD-10-CM

## 2017-10-22 DIAGNOSIS — E66812 Obesity, class 2: Secondary | ICD-10-CM

## 2017-10-22 DIAGNOSIS — T7422XS Child sexual abuse, confirmed, sequela: Secondary | ICD-10-CM

## 2017-10-22 DIAGNOSIS — M51369 Other intervertebral disc degeneration, lumbar region without mention of lumbar back pain or lower extremity pain: Secondary | ICD-10-CM

## 2017-10-22 MED ORDER — LAMOTRIGINE 200 MG PO TABS: 200.0000 mg | ORAL_TABLET | Freq: Every day | ORAL | 1 refills | Status: DC

## 2017-10-22 MED ORDER — DULOXETINE HCL 60 MG PO CPEP: 60.0000 mg | ORAL_CAPSULE | Freq: Every day | ORAL | 1 refills | Status: DC

## 2017-10-22 MED ORDER — VARENICLINE TARTRATE 1 MG PO TABS: ORAL_TABLET | ORAL | 2 refills | Status: DC

## 2017-10-22 MED ORDER — FLUOXETINE HCL 20 MG PO CAPS
20.0000 mg | ORAL_CAPSULE | Freq: Every day | ORAL | 2 refills | Status: DC
Start: 2017-10-22 — End: 2018-01-14
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
## 2017-11-10 ENCOUNTER — Other Ambulatory Visit (HOSPITAL_COMMUNITY): Payer: Self-pay | Admitting: Medical

## 2017-11-12 ENCOUNTER — Other Ambulatory Visit: Payer: Self-pay | Admitting: Physician Assistant

## 2017-11-12 DIAGNOSIS — R600 Localized edema: Secondary | ICD-10-CM

## 2017-11-13 ENCOUNTER — Encounter: Payer: Self-pay | Admitting: Physician Assistant

## 2017-11-13 ENCOUNTER — Ambulatory Visit: Payer: BLUE CROSS/BLUE SHIELD | Admitting: Physician Assistant

## 2017-11-13 ENCOUNTER — Ambulatory Visit (INDEPENDENT_AMBULATORY_CARE_PROVIDER_SITE_OTHER): Payer: BLUE CROSS/BLUE SHIELD

## 2017-11-13 VITALS — BP 117/63 | HR 81 | Ht 67.99 in | Wt 276.0 lb

## 2017-11-13 DIAGNOSIS — R600 Localized edema: Secondary | ICD-10-CM

## 2017-11-13 DIAGNOSIS — M25571 Pain in right ankle and joints of right foot: Secondary | ICD-10-CM

## 2017-11-13 DIAGNOSIS — R1310 Dysphagia, unspecified: Secondary | ICD-10-CM | POA: Diagnosis not present

## 2017-11-13 DIAGNOSIS — I10 Essential (primary) hypertension: Secondary | ICD-10-CM | POA: Diagnosis not present

## 2017-11-13 DIAGNOSIS — J0140 Acute pansinusitis, unspecified: Secondary | ICD-10-CM | POA: Diagnosis not present

## 2017-11-13 DIAGNOSIS — R1319 Other dysphagia: Secondary | ICD-10-CM

## 2017-11-13 DIAGNOSIS — H60332 Swimmer's ear, left ear: Secondary | ICD-10-CM

## 2017-11-13 DIAGNOSIS — Z79899 Other long term (current) drug therapy: Secondary | ICD-10-CM

## 2017-11-13 DIAGNOSIS — M7989 Other specified soft tissue disorders: Secondary | ICD-10-CM | POA: Diagnosis not present

## 2017-11-13 LAB — COMPLETE METABOLIC PANEL WITH GFR
AG Ratio: 2.3 (calc) (ref 1.0–2.5)
ALBUMIN MSPROF: 4.6 g/dL (ref 3.6–5.1)
ALT: 27 U/L (ref 6–29)
AST: 22 U/L (ref 10–30)
Alkaline phosphatase (APISO): 62 U/L (ref 33–115)
BUN/Creatinine Ratio: 5 (calc) — ABNORMAL LOW (ref 6–22)
BUN: 4 mg/dL — AB (ref 7–25)
CALCIUM: 9.3 mg/dL (ref 8.6–10.2)
CO2: 29 mmol/L (ref 20–32)
Chloride: 103 mmol/L (ref 98–110)
Creat: 0.73 mg/dL (ref 0.50–1.10)
GFR, EST NON AFRICAN AMERICAN: 101 mL/min/{1.73_m2} (ref >=60)
GFR, Est African American: 117 mL/min/{1.73_m2} (ref >=60)
GLUCOSE: 109 mg/dL — AB (ref 65–99)
Globulin: 2 g/dL (calc) (ref 1.9–3.7)
Potassium: 3.3 mmol/L — ABNORMAL LOW (ref 3.5–5.3)
Sodium: 142 mmol/L (ref 135–146)
Total Bilirubin: 0.4 mg/dL (ref 0.2–1.2)
Total Protein: 6.6 g/dL (ref 6.1–8.1)

## 2017-11-13 LAB — TSH: TSH: 1.89 mIU/L

## 2017-11-13 MED ORDER — AMOXICILLIN-POT CLAVULANATE 875-125 MG PO TABS: 1.0000 | ORAL_TABLET | Freq: Two times a day (BID) | ORAL | 0 refills | Status: DC

## 2017-11-13 MED ORDER — ATENOLOL 50 MG PO TABS: 50.0000 mg | ORAL_TABLET | Freq: Every day | ORAL | 1 refills | Status: DC

## 2017-11-13 MED ORDER — FUROSEMIDE 40 MG PO TABS: ORAL_TABLET | ORAL | 1 refills | Status: DC

## 2017-11-13 MED ORDER — OFLOXACIN 0.3 % OT SOLN: 10.0000 [drp] | Freq: Every day | OTIC | 0 refills | Status: DC

## 2017-11-13 NOTE — Progress Notes (Signed)
Mild soft tissue swelling with some joint fluid so there could have been some injury causing some of swelling and pain. No fracture.

## 2017-11-13 NOTE — Patient Instructions (Signed)
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
## 2017-11-15 ENCOUNTER — Encounter: Payer: Self-pay | Admitting: Physician Assistant

## 2017-11-15 DIAGNOSIS — R1310 Dysphagia, unspecified: Secondary | ICD-10-CM | POA: Insufficient documentation

## 2017-11-15 DIAGNOSIS — R600 Localized edema: Secondary | ICD-10-CM | POA: Insufficient documentation

## 2017-11-15 DIAGNOSIS — R1319 Other dysphagia: Secondary | ICD-10-CM | POA: Insufficient documentation

## 2017-11-16 MED ORDER — POTASSIUM CHLORIDE ER 10 MEQ PO TBCR: 10.0000 meq | EXTENDED_RELEASE_TABLET | Freq: Two times a day (BID) | ORAL | 2 refills | Status: DC

## 2017-11-16 NOTE — Progress Notes (Signed)
Call pt: thyroid looks great. Potasium just a hair low. Need to start a supplement to take with lasix. I will send to pharmacy.

## 2017-11-16 NOTE — Addendum Note (Signed)
Addended by: Donella Stade on: 11/16/2017 12:02 PM   Modules accepted: Orders

## 2017-12-21 ENCOUNTER — Ambulatory Visit (INDEPENDENT_AMBULATORY_CARE_PROVIDER_SITE_OTHER): Payer: BLUE CROSS/BLUE SHIELD | Admitting: Physician Assistant

## 2017-12-21 ENCOUNTER — Encounter: Payer: Self-pay | Admitting: Physician Assistant

## 2017-12-21 ENCOUNTER — Ambulatory Visit (INDEPENDENT_AMBULATORY_CARE_PROVIDER_SITE_OTHER): Payer: BLUE CROSS/BLUE SHIELD

## 2017-12-21 VITALS — BP 120/79 | HR 74 | Temp 98.5°F | Wt 280.0 lb

## 2017-12-21 DIAGNOSIS — Z23 Encounter for immunization: Secondary | ICD-10-CM

## 2017-12-21 DIAGNOSIS — R0781 Pleurodynia: Secondary | ICD-10-CM | POA: Diagnosis not present

## 2017-12-21 DIAGNOSIS — E876 Hypokalemia: Secondary | ICD-10-CM | POA: Diagnosis not present

## 2017-12-21 DIAGNOSIS — S299XXA Unspecified injury of thorax, initial encounter: Secondary | ICD-10-CM | POA: Diagnosis not present

## 2017-12-21 DIAGNOSIS — R0789 Other chest pain: Secondary | ICD-10-CM

## 2017-12-21 DIAGNOSIS — N644 Mastodynia: Secondary | ICD-10-CM | POA: Diagnosis not present

## 2017-12-21 MED ORDER — MELOXICAM 15 MG PO TABS: 15.0000 mg | ORAL_TABLET | Freq: Every day | ORAL | 0 refills | Status: DC

## 2017-12-21 NOTE — Patient Instructions (Addendum)
Fibrocystic Breast Changes Fibrocystic breast changes are changes in breast tissue that can cause breasts to become swollen, lumpy, or painful. This can happen due to buildup of scar-like tissue (fibrous tissue) or the forming of fluid-filled lumps (cysts) in the breast. This is a common condition, and it is not cancerous (is benign). The exact cause is not known, but it seems to occur when women go through hormonal changes during their menstrual cycle. Fibrocystic breast changes can affect one or both breasts. What are the causes? The exact cause of fibrocystic breast changes is not known. However, this condition:  May be related to the female hormones estrogen and progesterone.  May be influenced by family traits that get passed from parent to child (genetics).  What are the signs or symptoms? Symptoms of this condition may affect one or both breasts, and may include:  Tenderness, mild discomfort, or pain.  Swelling.  Rope-like tissue that can be felt when touching the breast.  Lumps in one or both breasts.  Changes in breast size. Breasts may get larger before the menstrual period and smaller after the menstrual period.  Green or dark brown discharge from the nipple.  Symptoms are usually worse before menstrual periods start, and they get better toward the end of menstrual periods. How is this diagnosed? This condition is diagnosed based on your medical history and a physical exam of your breasts. You may also have tests, such as:  A breast X-ray (mammogram).  Ultrasound of your breasts.  MRI.  Removal of a breast tissue sample for testing (breast biopsy). This may be done if your health care provider thinks that something else may be causing changes in your breasts.  How is this treated? Often, treatment is not needed for this condition. In some cases, treatment may include:  Taking over-the-counter pain relievers to help lessen pain or discomfort.  Limiting or avoiding  caffeine. Foods and beverages that contain caffeine include chocolate, soda, coffee, and tea.  Reducing sugar and fat in your diet.  Your health care provider may also recommend:  A procedure to remove fluid from a cyst that is causing pain (fine needle aspiration).  Surgery to remove a cyst that is large or tender or does not go away.  Follow these instructions at home:  Examine your breasts after every menstrual period. If you do not have menstrual periods, check your breasts on the first day of every month. Feel for changes in your breasts, such as: ? More tenderness. ? A new growth. ? A change in size. ? A change in an existing lump.  Take over-the-counter and prescription medicines only as told by your health care provider.  Wear a well-fitted support or sports bra, especially when exercising.  Decrease or avoid caffeine, fat, and sugar in your diet as directed by your health care provider. Contact a health care provider if:  You have fluid leaking from your nipple, especially if it is bloody.  You have new lumps or bumps in your breast.  Your breast becomes enlarged, red, and painful.  You have areas of your breast that pucker inward.  Your nipple appears flat or indented. Get help right away if:  You have redness of your breast and the redness is spreading. Summary  Fibrocystic breast changes are changes in breast tissue that can cause breasts to become swollen, lumpy, or painful.  This condition may be related to the female hormones estrogen and progesterone.  With this condition, it is important to examine  your breasts after every menstrual period. If you do not have menstrual periods, check your breasts on the first day of every month. This information is not intended to replace advice given to you by your health care provider. Make sure you discuss any questions you have with your health care provider. Document Released: 02/05/2006 Document Revised: 01/01/2016  Document Reviewed: 12/19/2015 Elsevier Interactive Patient Education  2017 [employer].
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
## 2017-12-23 LAB — BASIC METABOLIC PANEL
BUN: 7 mg/dL (ref 7–25)
CHLORIDE: 100 mmol/L (ref 98–110)
CO2: 32 mmol/L (ref 20–32)
CREATININE: 0.62 mg/dL (ref 0.50–1.10)
Calcium: 9.2 mg/dL (ref 8.6–10.2)
Glucose, Bld: 89 mg/dL (ref 65–99)
POTASSIUM: 3.9 mmol/L (ref 3.5–5.3)
SODIUM: 140 mmol/L (ref 135–146)

## 2017-12-23 LAB — PTH, INTACT AND CALCIUM
Calcium: 9.2 mg/dL (ref 8.6–10.2)
PTH: 104 pg/mL — ABNORMAL HIGH (ref 14–64)

## 2017-12-24 ENCOUNTER — Encounter: Payer: Self-pay | Admitting: Physician Assistant

## 2017-12-24 DIAGNOSIS — R7989 Other specified abnormal findings of blood chemistry: Secondary | ICD-10-CM | POA: Insufficient documentation

## 2017-12-25 ENCOUNTER — Ambulatory Visit
Admission: RE | Admit: 2017-12-25 | Discharge: 2017-12-25 | Disposition: A | Discharge status: Home / Self Care | Payer: BLUE CROSS/BLUE SHIELD | Source: Ambulatory Visit | Attending: Physician Assistant | Admitting: Physician Assistant

## 2017-12-25 DIAGNOSIS — N644 Mastodynia: Secondary | ICD-10-CM

## 2017-12-25 DIAGNOSIS — R928 Other abnormal and inconclusive findings on diagnostic imaging of breast: Secondary | ICD-10-CM | POA: Diagnosis not present

## 2017-12-26 ENCOUNTER — Other Ambulatory Visit: Payer: Self-pay | Admitting: Physician Assistant

## 2017-12-26 ENCOUNTER — Encounter: Payer: Self-pay | Admitting: Physician Assistant

## 2017-12-26 DIAGNOSIS — N644 Mastodynia: Secondary | ICD-10-CM | POA: Insufficient documentation

## 2017-12-26 DIAGNOSIS — R0789 Other chest pain: Secondary | ICD-10-CM | POA: Insufficient documentation

## 2017-12-26 DIAGNOSIS — M8580 Other specified disorders of bone density and structure, unspecified site: Secondary | ICD-10-CM

## 2017-12-26 DIAGNOSIS — E559 Vitamin D deficiency, unspecified: Secondary | ICD-10-CM

## 2018-01-13 DIAGNOSIS — F329 Major depressive disorder, single episode, unspecified: Secondary | ICD-10-CM | POA: Diagnosis not present

## 2018-01-13 DIAGNOSIS — I10 Essential (primary) hypertension: Secondary | ICD-10-CM | POA: Diagnosis not present

## 2018-01-13 DIAGNOSIS — Z7951 Long term (current) use of inhaled steroids: Secondary | ICD-10-CM | POA: Diagnosis not present

## 2018-01-13 DIAGNOSIS — R062 Wheezing: Secondary | ICD-10-CM | POA: Diagnosis not present

## 2018-01-13 DIAGNOSIS — T781XXA Other adverse food reactions, not elsewhere classified, initial encounter: Secondary | ICD-10-CM | POA: Diagnosis not present

## 2018-01-13 DIAGNOSIS — Z87891 Personal history of nicotine dependence: Secondary | ICD-10-CM | POA: Diagnosis not present

## 2018-01-13 DIAGNOSIS — R200 Anesthesia of skin: Secondary | ICD-10-CM | POA: Diagnosis not present

## 2018-01-13 DIAGNOSIS — Z883 Allergy status to other anti-infective agents status: Secondary | ICD-10-CM | POA: Diagnosis not present

## 2018-01-13 DIAGNOSIS — R0602 Shortness of breath: Secondary | ICD-10-CM | POA: Diagnosis not present

## 2018-01-13 DIAGNOSIS — Z79899 Other long term (current) drug therapy: Secondary | ICD-10-CM | POA: Diagnosis not present

## 2018-01-13 DIAGNOSIS — Z885 Allergy status to narcotic agent status: Secondary | ICD-10-CM | POA: Diagnosis not present

## 2018-01-13 DIAGNOSIS — Z888 Allergy status to other drugs, medicaments and biological substances status: Secondary | ICD-10-CM | POA: Diagnosis not present

## 2018-01-13 DIAGNOSIS — T7840XA Allergy, unspecified, initial encounter: Secondary | ICD-10-CM | POA: Diagnosis not present

## 2018-01-14 ENCOUNTER — Other Ambulatory Visit: Payer: Self-pay | Admitting: Physician Assistant

## 2018-01-14 ENCOUNTER — Ambulatory Visit (INDEPENDENT_AMBULATORY_CARE_PROVIDER_SITE_OTHER): Payer: BLUE CROSS/BLUE SHIELD | Admitting: Medical

## 2018-01-14 ENCOUNTER — Encounter (HOSPITAL_COMMUNITY): Payer: Self-pay | Admitting: Medical

## 2018-01-14 VITALS — BP 124/76 | HR 80 | Ht 67.0 in | Wt 274.0 lb

## 2018-01-14 DIAGNOSIS — G894 Chronic pain syndrome: Secondary | ICD-10-CM

## 2018-01-14 DIAGNOSIS — F3181 Bipolar II disorder: Secondary | ICD-10-CM

## 2018-01-14 DIAGNOSIS — F4312 Post-traumatic stress disorder, chronic: Secondary | ICD-10-CM

## 2018-01-14 DIAGNOSIS — Z6837 Body mass index (BMI) 37.0-37.9, adult: Secondary | ICD-10-CM

## 2018-01-14 DIAGNOSIS — M797 Fibromyalgia: Secondary | ICD-10-CM | POA: Diagnosis not present

## 2018-01-14 DIAGNOSIS — M5136 Other intervertebral disc degeneration, lumbar region: Secondary | ICD-10-CM

## 2018-01-14 DIAGNOSIS — F17200 Nicotine dependence, unspecified, uncomplicated: Secondary | ICD-10-CM

## 2018-01-14 DIAGNOSIS — Z639 Problem related to primary support group, unspecified: Secondary | ICD-10-CM

## 2018-01-14 DIAGNOSIS — Z1239 Encounter for other screening for malignant neoplasm of breast: Secondary | ICD-10-CM

## 2018-01-14 DIAGNOSIS — M1610 Unilateral primary osteoarthritis, unspecified hip: Secondary | ICD-10-CM

## 2018-01-14 DIAGNOSIS — T7422XS Child sexual abuse, confirmed, sequela: Secondary | ICD-10-CM

## 2018-01-14 MED ORDER — FLUOXETINE HCL 20 MG PO CAPS: 20.0000 mg | ORAL_CAPSULE | Freq: Every day | ORAL | 2 refills | Status: DC

## 2018-01-14 MED ORDER — VARENICLINE TARTRATE 1 MG PO TABS: 1.0000 mg | ORAL_TABLET | Freq: Every day | ORAL | 2 refills | Status: AC

## 2018-01-14 MED ORDER — DULOXETINE HCL 60 MG PO CPEP: 60.0000 mg | ORAL_CAPSULE | Freq: Every day | ORAL | 1 refills | Status: DC

## 2018-01-14 MED ORDER — LAMOTRIGINE 200 MG PO TABS: 200.0000 mg | ORAL_TABLET | Freq: Every day | ORAL | 1 refills | Status: DC
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
## 2018-01-16 DIAGNOSIS — Z888 Allergy status to other drugs, medicaments and biological substances status: Secondary | ICD-10-CM | POA: Diagnosis not present

## 2018-01-16 DIAGNOSIS — J040 Acute laryngitis: Secondary | ICD-10-CM | POA: Diagnosis not present

## 2018-01-16 DIAGNOSIS — Z885 Allergy status to narcotic agent status: Secondary | ICD-10-CM | POA: Diagnosis not present

## 2018-01-16 DIAGNOSIS — R05 Cough: Secondary | ICD-10-CM | POA: Diagnosis not present

## 2018-01-16 DIAGNOSIS — J45909 Unspecified asthma, uncomplicated: Secondary | ICD-10-CM | POA: Diagnosis not present

## 2018-01-16 DIAGNOSIS — F329 Major depressive disorder, single episode, unspecified: Secondary | ICD-10-CM | POA: Diagnosis not present

## 2018-01-16 DIAGNOSIS — B9789 Other viral agents as the cause of diseases classified elsewhere: Secondary | ICD-10-CM | POA: Diagnosis not present

## 2018-01-16 DIAGNOSIS — Z881 Allergy status to other antibiotic agents status: Secondary | ICD-10-CM | POA: Diagnosis not present

## 2018-01-16 DIAGNOSIS — G629 Polyneuropathy, unspecified: Secondary | ICD-10-CM | POA: Diagnosis not present

## 2018-01-16 DIAGNOSIS — M797 Fibromyalgia: Secondary | ICD-10-CM | POA: Diagnosis not present

## 2018-01-16 DIAGNOSIS — Z79899 Other long term (current) drug therapy: Secondary | ICD-10-CM | POA: Diagnosis not present

## 2018-01-16 DIAGNOSIS — J028 Acute pharyngitis due to other specified organisms: Secondary | ICD-10-CM | POA: Diagnosis not present

## 2018-01-16 DIAGNOSIS — Z87891 Personal history of nicotine dependence: Secondary | ICD-10-CM | POA: Diagnosis not present

## 2018-01-16 DIAGNOSIS — I10 Essential (primary) hypertension: Secondary | ICD-10-CM | POA: Diagnosis not present

## 2018-01-16 DIAGNOSIS — Z886 Allergy status to analgesic agent status: Secondary | ICD-10-CM | POA: Diagnosis not present

## 2018-01-16 DIAGNOSIS — J029 Acute pharyngitis, unspecified: Secondary | ICD-10-CM | POA: Diagnosis not present

## 2018-01-20 ENCOUNTER — Ambulatory Visit (INDEPENDENT_AMBULATORY_CARE_PROVIDER_SITE_OTHER): Payer: BLUE CROSS/BLUE SHIELD | Admitting: Physician Assistant

## 2018-01-20 ENCOUNTER — Encounter: Payer: Self-pay | Admitting: Physician Assistant

## 2018-01-20 VITALS — BP 121/73 | HR 71 | Temp 98.8°F | Ht 67.0 in | Wt 272.0 lb

## 2018-01-20 DIAGNOSIS — J0140 Acute pansinusitis, unspecified: Secondary | ICD-10-CM | POA: Diagnosis not present

## 2018-01-20 DIAGNOSIS — T7840XD Allergy, unspecified, subsequent encounter: Secondary | ICD-10-CM | POA: Diagnosis not present

## 2018-01-20 MED ORDER — FLUCONAZOLE 150 MG PO TABS: 150.0000 mg | ORAL_TABLET | Freq: Once | ORAL | 0 refills | Status: AC

## 2018-01-20 MED ORDER — AMOXICILLIN-POT CLAVULANATE 875-125 MG PO TABS: 1.0000 | ORAL_TABLET | Freq: Two times a day (BID) | ORAL | 0 refills | Status: DC
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
## 2018-01-20 NOTE — Patient Instructions (Signed)
Sinusitis, Adult Sinusitis is soreness and inflammation of your sinuses. Sinuses are hollow spaces in the bones around your face. They are located:  Around your eyes.  In the middle of your forehead.  Behind your nose.  In your cheekbones.  Your sinuses and nasal passages are lined with a stringy fluid (mucus). Mucus normally drains out of your sinuses. When your nasal tissues get inflamed or swollen, the mucus can get trapped or blocked so air cannot flow through your sinuses. This lets bacteria, viruses, and funguses grow, and that leads to infection. Follow these instructions at home: Medicines  Take, use, or apply over-the-counter and prescription medicines only as told by your doctor. These may include nasal sprays.  If you were prescribed an antibiotic medicine, take it as told by your doctor. Do not stop taking the antibiotic even if you start to feel better. Hydrate and Humidify  Drink enough water to keep your pee (urine) clear or pale yellow.  Use a cool mist humidifier to keep the humidity level in your home above 50%.  Breathe in steam for 10-15 minutes, 3-4 times a day or as told by your doctor. You can do this in the bathroom while a hot shower is running.  Try not to spend time in cool or dry air. Rest  Rest as much as possible.  Sleep with your head raised (elevated).  Make sure to get enough sleep each night. General instructions  Put a warm, moist washcloth on your face 3-4 times a day or as told by your doctor. This will help with discomfort.  Wash your hands often with soap and water. If there is no soap and water, use hand sanitizer.  Do not smoke. Avoid being around people who are smoking (secondhand smoke).  Keep all follow-up visits as told by your doctor. This is important. Contact a doctor if:  You have a fever.  Your symptoms get worse.  Your symptoms do not get better within 10 days. Get help right away if:  You have a very bad  headache.  You cannot stop throwing up (vomiting).  You have pain or swelling around your face or eyes.  You have trouble seeing.  You feel confused.  Your neck is stiff.  You have trouble breathing. This information is not intended to replace advice given to you by your health care provider. Make sure you discuss any questions you have with your health care provider. Document Released: 10/08/2007 Document Revised: 12/16/2015 Document Reviewed: 02/14/2015 Elsevier Interactive Patient Education  [employer].

## 2018-01-21 NOTE — Progress Notes (Signed)
Call pt: no immediate allergy detected to tomato.

## 2018-01-22 ENCOUNTER — Encounter: Payer: Self-pay | Admitting: Physician Assistant

## 2018-01-22 NOTE — Progress Notes (Signed)
Hopefully antibiotic will resolve sinus issues. I think tomatoes are worsening your acid reflux so you need to avoid or use zantac  158m before you eat tomatoes.

## 2018-01-23 LAB — ALLERGEN, TOMATO F25: CLASS: 0

## 2018-01-23 LAB — ALLERGEN, TOMATO IGG

## 2018-01-23 LAB — INTERPRETATION:

## 2018-02-03 ENCOUNTER — Other Ambulatory Visit: Payer: Self-pay

## 2018-02-03 ENCOUNTER — Ambulatory Visit (INDEPENDENT_AMBULATORY_CARE_PROVIDER_SITE_OTHER): Payer: BLUE CROSS/BLUE SHIELD

## 2018-02-03 DIAGNOSIS — Z1231 Encounter for screening mammogram for malignant neoplasm of breast: Secondary | ICD-10-CM | POA: Diagnosis not present

## 2018-02-03 DIAGNOSIS — Z1239 Encounter for other screening for malignant neoplasm of breast: Secondary | ICD-10-CM

## 2018-02-03 MED ORDER — POTASSIUM CHLORIDE ER 10 MEQ PO TBCR: 10.0000 meq | EXTENDED_RELEASE_TABLET | Freq: Two times a day (BID) | ORAL | 2 refills | Status: DC

## 2018-02-03 NOTE — Progress Notes (Signed)
Call pt: normal mammogram. Follow up in one year.

## 2018-02-15 DIAGNOSIS — M797 Fibromyalgia: Secondary | ICD-10-CM | POA: Diagnosis not present

## 2018-02-15 DIAGNOSIS — Z79899 Other long term (current) drug therapy: Secondary | ICD-10-CM | POA: Diagnosis not present

## 2018-02-15 DIAGNOSIS — J0100 Acute maxillary sinusitis, unspecified: Secondary | ICD-10-CM | POA: Diagnosis not present

## 2018-02-15 DIAGNOSIS — H1033 Unspecified acute conjunctivitis, bilateral: Secondary | ICD-10-CM | POA: Diagnosis not present

## 2018-02-15 DIAGNOSIS — R51 Headache: Secondary | ICD-10-CM | POA: Diagnosis not present

## 2018-02-15 DIAGNOSIS — Z885 Allergy status to narcotic agent status: Secondary | ICD-10-CM | POA: Diagnosis not present

## 2018-02-15 DIAGNOSIS — F17210 Nicotine dependence, cigarettes, uncomplicated: Secondary | ICD-10-CM | POA: Diagnosis not present

## 2018-02-15 DIAGNOSIS — B9689 Other specified bacterial agents as the cause of diseases classified elsewhere: Secondary | ICD-10-CM | POA: Diagnosis not present

## 2018-02-15 DIAGNOSIS — F329 Major depressive disorder, single episode, unspecified: Secondary | ICD-10-CM | POA: Diagnosis not present

## 2018-02-15 DIAGNOSIS — I10 Essential (primary) hypertension: Secondary | ICD-10-CM | POA: Diagnosis not present

## 2018-02-15 DIAGNOSIS — G629 Polyneuropathy, unspecified: Secondary | ICD-10-CM | POA: Diagnosis not present

## 2018-02-15 DIAGNOSIS — Z791 Long term (current) use of non-steroidal anti-inflammatories (NSAID): Secondary | ICD-10-CM | POA: Diagnosis not present

## 2018-02-15 DIAGNOSIS — Z888 Allergy status to other drugs, medicaments and biological substances status: Secondary | ICD-10-CM | POA: Diagnosis not present

## 2018-02-15 DIAGNOSIS — M542 Cervicalgia: Secondary | ICD-10-CM | POA: Diagnosis not present

## 2018-02-15 DIAGNOSIS — M1990 Unspecified osteoarthritis, unspecified site: Secondary | ICD-10-CM | POA: Diagnosis not present

## 2018-02-15 DIAGNOSIS — Z883 Allergy status to other anti-infective agents status: Secondary | ICD-10-CM | POA: Diagnosis not present

## 2018-02-16 ENCOUNTER — Other Ambulatory Visit: Payer: Self-pay | Admitting: Physician Assistant

## 2018-03-03 ENCOUNTER — Encounter: Payer: Self-pay | Admitting: Physician Assistant

## 2018-03-03 ENCOUNTER — Ambulatory Visit (INDEPENDENT_AMBULATORY_CARE_PROVIDER_SITE_OTHER): Payer: BLUE CROSS/BLUE SHIELD | Admitting: Physician Assistant

## 2018-03-03 VITALS — BP 124/87 | HR 85 | Ht 67.0 in | Wt 265.0 lb

## 2018-03-03 DIAGNOSIS — D239 Other benign neoplasm of skin, unspecified: Secondary | ICD-10-CM | POA: Diagnosis not present

## 2018-03-03 DIAGNOSIS — L989 Disorder of the skin and subcutaneous tissue, unspecified: Secondary | ICD-10-CM

## 2018-03-05 ENCOUNTER — Encounter: Payer: Self-pay | Admitting: Physician Assistant
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
## 2018-03-07 NOTE — Progress Notes (Signed)
Call pt: confirmed dermatofibroma just as suspected.

## 2018-03-15 ENCOUNTER — Encounter: Payer: Self-pay | Admitting: Physician Assistant

## 2018-03-15 ENCOUNTER — Ambulatory Visit (INDEPENDENT_AMBULATORY_CARE_PROVIDER_SITE_OTHER): Payer: Self-pay | Admitting: Physician Assistant

## 2018-03-15 VITALS — BP 131/84 | HR 79 | Temp 98.4°F | Wt 282.0 lb

## 2018-03-15 DIAGNOSIS — H5789 Other specified disorders of eye and adnexa: Secondary | ICD-10-CM

## 2018-03-15 DIAGNOSIS — H10022 Other mucopurulent conjunctivitis, left eye: Secondary | ICD-10-CM

## 2018-03-15 DIAGNOSIS — Z973 Presence of spectacles and contact lenses: Secondary | ICD-10-CM

## 2018-03-15 MED ORDER — POLYMYXIN B-TRIMETHOPRIM 10000-0.1 UNIT/ML-% OP SOLN: 1.0000 [drp] | OPHTHALMIC | 0 refills | Status: AC
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
## 2018-03-15 NOTE — Patient Instructions (Signed)
Bacterial Conjunctivitis Bacterial conjunctivitis is an infection of your conjunctiva. This is the clear membrane that covers the white part of your eye and the inner surface of your eyelid. This condition can make your eye:  Red or pink.  Itchy.  This condition is caused by bacteria. This condition spreads very easily from person to person (is contagious) and from one eye to the other eye. Follow these instructions at home: Medicines  Take or apply your antibiotic medicine as told by your doctor. Do not stop taking or applying the antibiotic even if you start to feel better.  Take or apply over-the-counter and prescription medicines only as told by your doctor.  Do not touch your eyelid with the eye drop bottle or the ointment tube. Managing discomfort  Wipe any fluid from your eye with a warm, wet washcloth or a cotton ball.  Place a cool, clean washcloth on your eye. Do this for 10-20 minutes, 3-4 times per day. General instructions  Do not wear contact lenses until the irritation is gone. Wear glasses until your doctor says it is okay to wear contacts.  Do not wear eye makeup until your symptoms are gone. Throw away any old makeup.  Change or wash your pillowcase every day.  Do not share towels or washcloths with anyone.  Wash your hands often with soap and water. Use paper towels to dry your hands.  Do not touch or rub your eyes.  Do not drive or use heavy machinery if your vision is blurry. Contact a doctor if:  You have a fever.  Your symptoms do not get better after 10 days. Get help right away if:  You have a fever and your symptoms suddenly get worse.  You have very bad pain when you move your eye.  Your face: ? Hurts. ? Is red. ? Is swollen.  You have sudden loss of vision. This information is not intended to replace advice given to you by your health care provider. Make sure you discuss any questions you have with your health care provider. Document  Released: 01/29/2008 Document Revised: 09/27/2015 Document Reviewed: 02/01/2015 Elsevier Interactive Patient Education  [employer].

## 2018-05-13 ENCOUNTER — Other Ambulatory Visit: Payer: Self-pay

## 2018-05-13 ENCOUNTER — Ambulatory Visit (INDEPENDENT_AMBULATORY_CARE_PROVIDER_SITE_OTHER): Payer: 59 | Admitting: Medical

## 2018-05-13 ENCOUNTER — Encounter (HOSPITAL_COMMUNITY): Payer: Self-pay | Admitting: Medical

## 2018-05-13 VITALS — BP 120/86 | HR 70 | Ht 67.0 in | Wt 276.0 lb

## 2018-05-13 DIAGNOSIS — F5101 Primary insomnia: Secondary | ICD-10-CM

## 2018-05-13 DIAGNOSIS — Z6837 Body mass index (BMI) 37.0-37.9, adult: Secondary | ICD-10-CM

## 2018-05-13 DIAGNOSIS — F341 Dysthymic disorder: Secondary | ICD-10-CM

## 2018-05-13 DIAGNOSIS — Z8659 Personal history of other mental and behavioral disorders: Secondary | ICD-10-CM | POA: Diagnosis not present

## 2018-05-13 DIAGNOSIS — M5136 Other intervertebral disc degeneration, lumbar region: Secondary | ICD-10-CM

## 2018-05-13 DIAGNOSIS — F17200 Nicotine dependence, unspecified, uncomplicated: Secondary | ICD-10-CM

## 2018-05-13 DIAGNOSIS — F4312 Post-traumatic stress disorder, chronic: Secondary | ICD-10-CM

## 2018-05-13 DIAGNOSIS — S61511S Laceration without foreign body of right wrist, sequela: Secondary | ICD-10-CM

## 2018-05-13 DIAGNOSIS — M797 Fibromyalgia: Secondary | ICD-10-CM

## 2018-05-13 DIAGNOSIS — Z639 Problem related to primary support group, unspecified: Secondary | ICD-10-CM
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
## 2018-06-10 ENCOUNTER — Other Ambulatory Visit: Payer: Self-pay | Admitting: Physician Assistant

## 2018-06-10 DIAGNOSIS — I10 Essential (primary) hypertension: Secondary | ICD-10-CM

## 2018-08-09 ENCOUNTER — Telehealth (HOSPITAL_COMMUNITY): Payer: Self-pay

## 2018-08-09 MED ORDER — DULOXETINE HCL 60 MG PO CPEP: 60.0000 mg | ORAL_CAPSULE | Freq: Every day | ORAL | 0 refills | Status: DC

## 2018-08-09 NOTE — Telephone Encounter (Signed)
Crystl please refill to appointment Thanks

## 2018-08-09 NOTE — Telephone Encounter (Signed)
Patient is completely out of Cymbalta. He appt is not until 09/02/18. [facility] in [place]

## 2018-08-09 NOTE — Telephone Encounter (Signed)
Sent a one month supply to pharmacy per Juanda Crumble. Informed patient.

## 2018-08-15 ENCOUNTER — Other Ambulatory Visit: Payer: Self-pay | Admitting: Physician Assistant

## 2018-09-02 ENCOUNTER — Ambulatory Visit (INDEPENDENT_AMBULATORY_CARE_PROVIDER_SITE_OTHER): Payer: 59 | Admitting: Medical

## 2018-09-02 ENCOUNTER — Encounter (HOSPITAL_COMMUNITY): Payer: Self-pay | Admitting: Medical

## 2018-09-02 DIAGNOSIS — Z6837 Body mass index (BMI) 37.0-37.9, adult: Secondary | ICD-10-CM

## 2018-09-02 DIAGNOSIS — F99 Mental disorder, not otherwise specified: Secondary | ICD-10-CM

## 2018-09-02 DIAGNOSIS — F4312 Post-traumatic stress disorder, chronic: Secondary | ICD-10-CM | POA: Diagnosis not present

## 2018-09-02 DIAGNOSIS — M5136 Other intervertebral disc degeneration, lumbar region: Secondary | ICD-10-CM

## 2018-09-02 DIAGNOSIS — M51369 Other intervertebral disc degeneration, lumbar region without mention of lumbar back pain or lower extremity pain: Secondary | ICD-10-CM

## 2018-09-02 DIAGNOSIS — F341 Dysthymic disorder: Secondary | ICD-10-CM | POA: Diagnosis not present

## 2018-09-02 DIAGNOSIS — F5105 Insomnia due to other mental disorder: Secondary | ICD-10-CM

## 2018-09-02 DIAGNOSIS — F17200 Nicotine dependence, unspecified, uncomplicated: Secondary | ICD-10-CM

## 2018-09-02 DIAGNOSIS — Z639 Problem related to primary support group, unspecified: Secondary | ICD-10-CM

## 2018-09-02 DIAGNOSIS — Z62819 Personal history of unspecified abuse in childhood: Secondary | ICD-10-CM | POA: Diagnosis not present

## 2018-09-02 DIAGNOSIS — M1610 Unilateral primary osteoarthritis, unspecified hip: Secondary | ICD-10-CM

## 2018-09-02 DIAGNOSIS — Z8739 Personal history of other diseases of the musculoskeletal system and connective tissue: Secondary | ICD-10-CM | POA: Diagnosis not present

## 2018-09-02 DIAGNOSIS — F3181 Bipolar II disorder: Secondary | ICD-10-CM

## 2018-09-02 DIAGNOSIS — S61511S Laceration without foreign body of right wrist, sequela: Secondary | ICD-10-CM

## 2018-09-02 DIAGNOSIS — G894 Chronic pain syndrome: Secondary | ICD-10-CM

## 2018-09-02 DIAGNOSIS — E66812 Obesity, class 2: Secondary | ICD-10-CM

## 2018-09-02 MED ORDER — FLUOXETINE HCL 20 MG PO CAPS: 20.0000 mg | ORAL_CAPSULE | Freq: Every day | ORAL | 0 refills | Status: DC

## 2018-09-02 MED ORDER — DULOXETINE HCL 60 MG PO CPEP: 60.0000 mg | ORAL_CAPSULE | Freq: Every day | ORAL | 0 refills | Status: DC

## 2018-09-02 MED ORDER — LAMOTRIGINE 200 MG PO TABS: 200.0000 mg | ORAL_TABLET | Freq: Every day | ORAL | 0 refills | Status: DC
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
## 2018-09-08 ENCOUNTER — Other Ambulatory Visit: Payer: Self-pay | Admitting: Physician Assistant

## 2018-09-08 DIAGNOSIS — I10 Essential (primary) hypertension: Secondary | ICD-10-CM

## 2018-09-08 NOTE — Telephone Encounter (Signed)
Please contact patient to schedule virtual or office appt with [facility] prior to refills. Thanks!

## 2018-09-08 NOTE — Telephone Encounter (Signed)
Called PT 3x and left VM on home number to schedule appointment.

## 2018-09-24 ENCOUNTER — Other Ambulatory Visit: Payer: Self-pay | Admitting: Physician Assistant

## 2018-09-24 DIAGNOSIS — R600 Localized edema: Secondary | ICD-10-CM

## 2018-10-02 IMAGING — CT CT PELVIS W/O CM
2 of 5 series · 15 of 46 positions shown, 17 images · non-contrast
Comparison: CT the abdomen and pelvis 06/26/2014.

CLINICAL DATA: 40-year-old female with history of left lower pelvic
pain for the past 2 months. Possible hernia. Difficulty walking in
moving. Some associated nausea.

EXAM:
CT PELVIS WITHOUT CONTRAST
TECHNIQUE: Multidetector CT imaging of the pelvis was performed following the
standard protocol without intravenous contrast.

[Series 3: axial soft · axial · 0.98mm/px · z∈[-391,-131]mm · 12 of 150 slices shown, 14 images]
[im 10/150  soft-tissue]
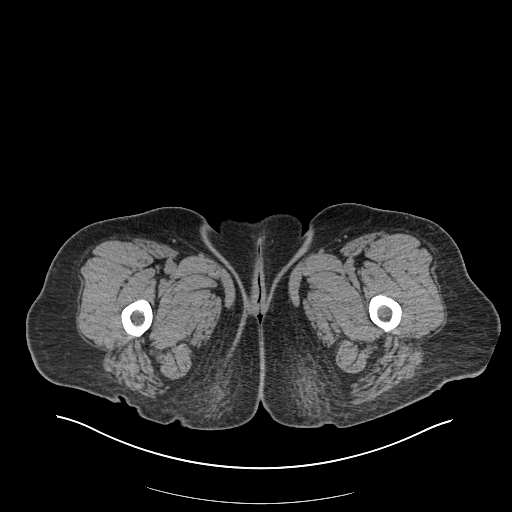
[im 10/150  bone]
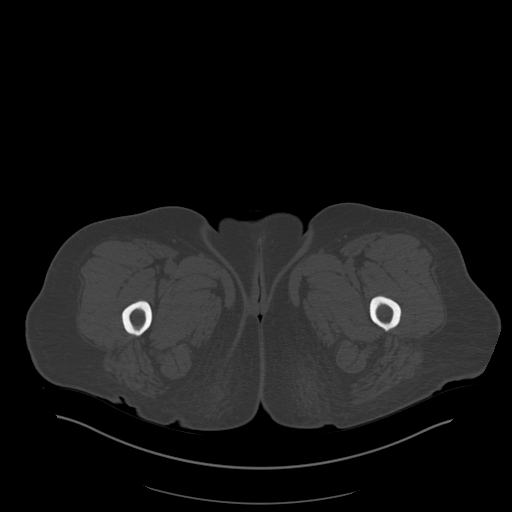
[im 20/150  soft-tissue]
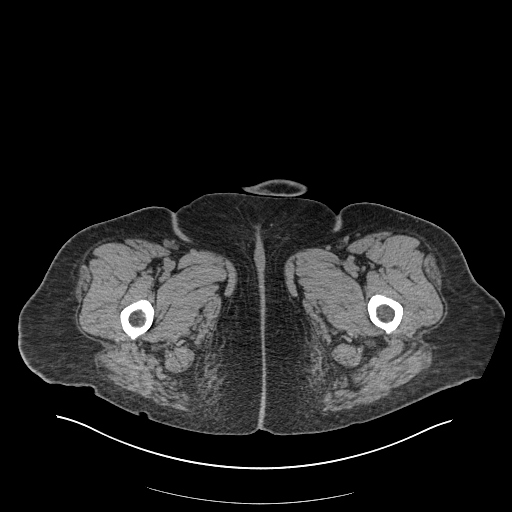
[im 34/150  soft-tissue]
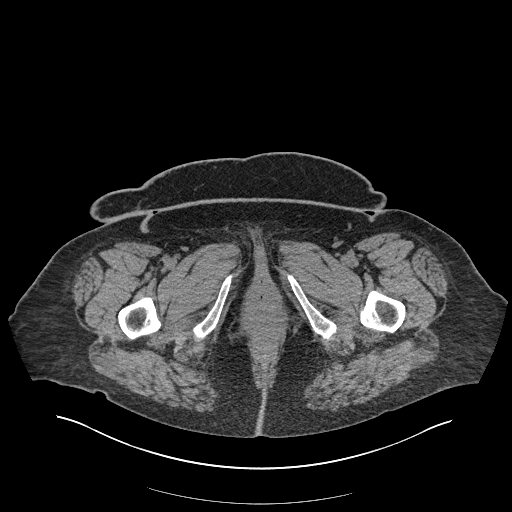
[im 44/150  soft-tissue]
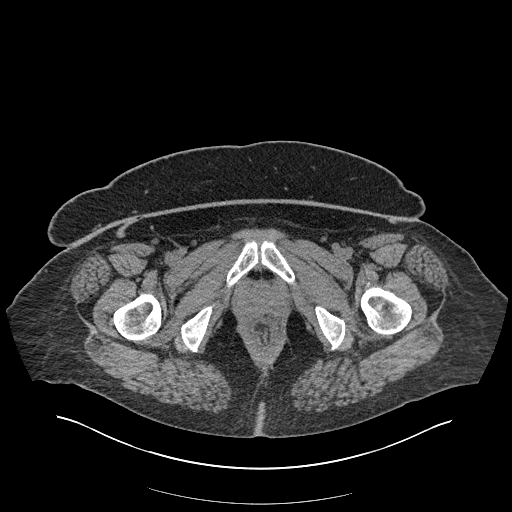
[im 58/150  soft-tissue]
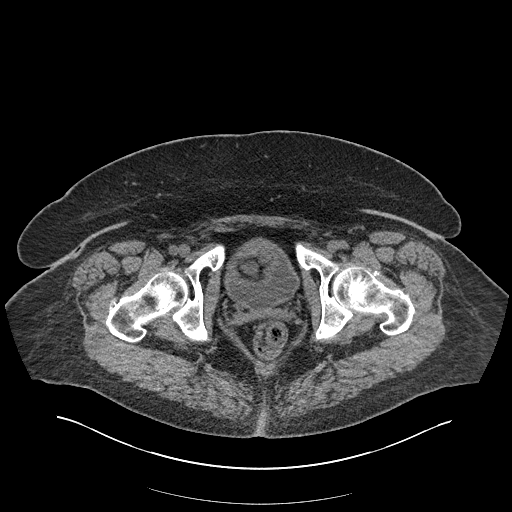
[im 68/150  soft-tissue]
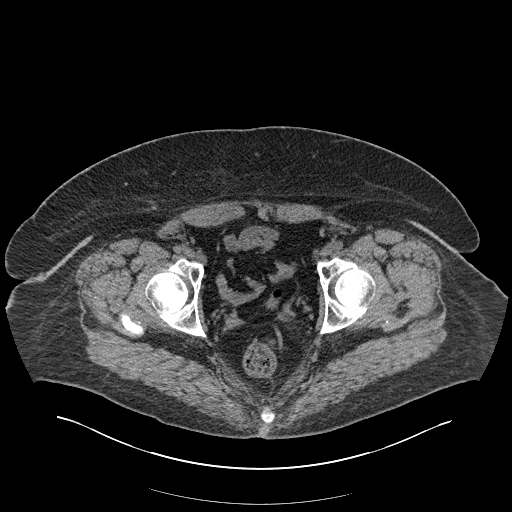
[im 82/150  soft-tissue]
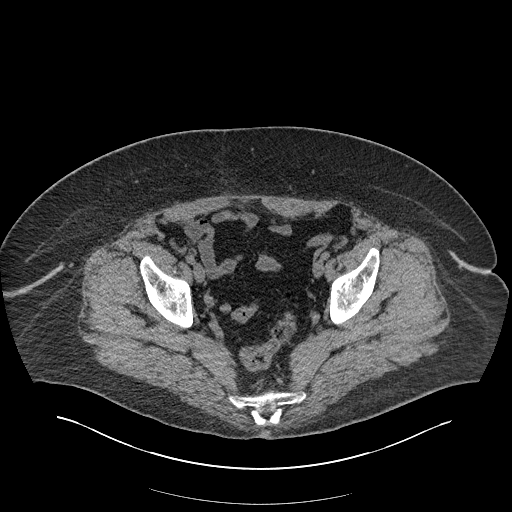
[im 92/150  soft-tissue]
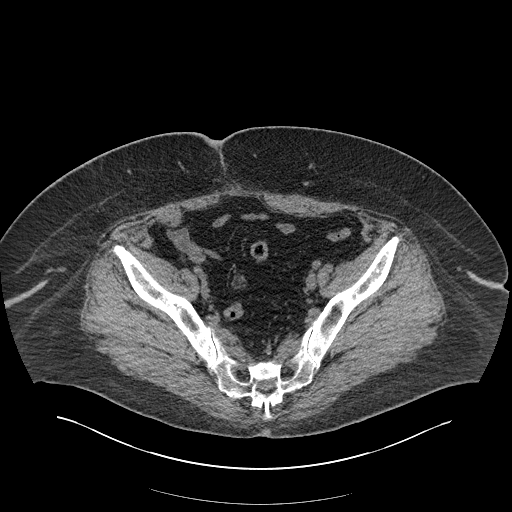
[im 106/150  soft-tissue]
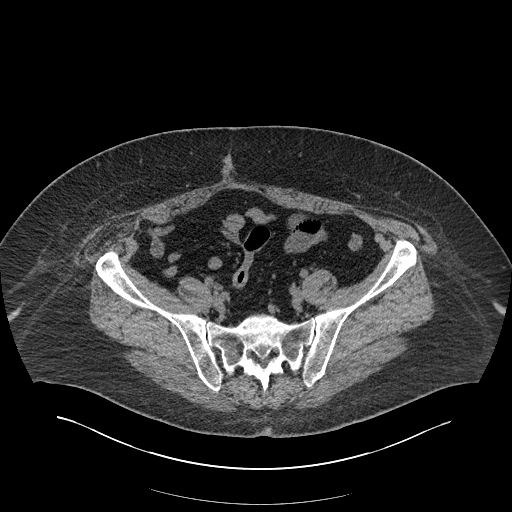
[im 106/150  bone]
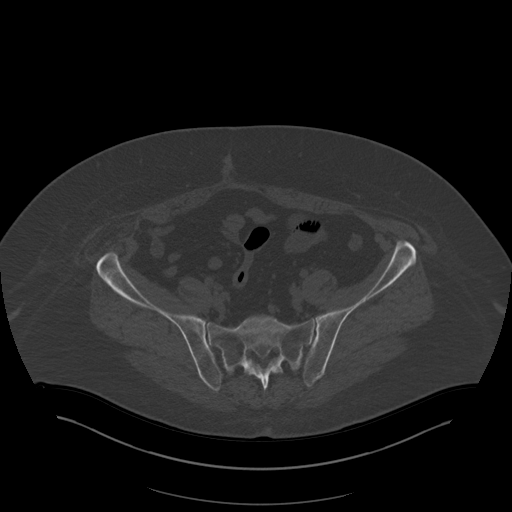
[im 116/150  soft-tissue]
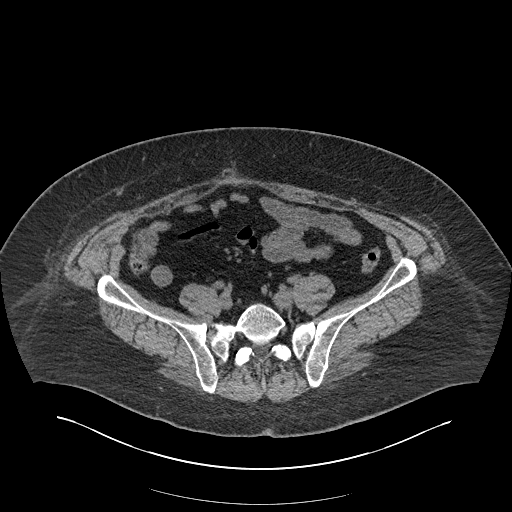
[im 130/150  soft-tissue]
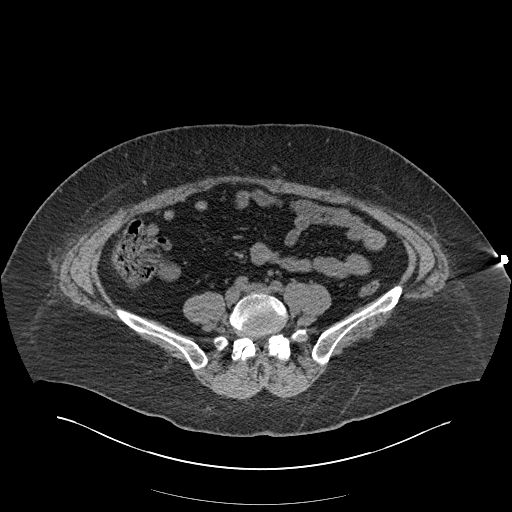
[im 140/150  soft-tissue]
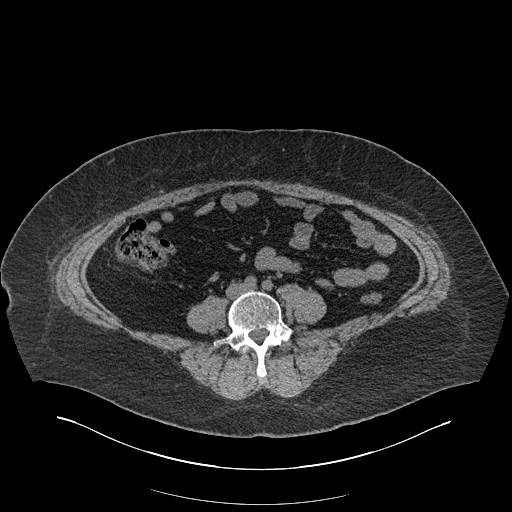

[Series 6: coronal st · coronal · 0.61mm/px · 3 of 161 slices shown]
[im 33/161  soft-tissue]
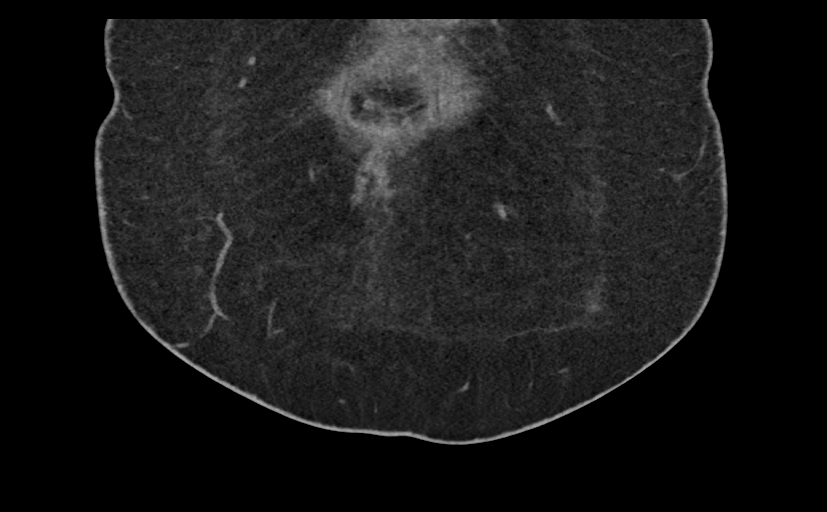
[im 65/161  soft-tissue]
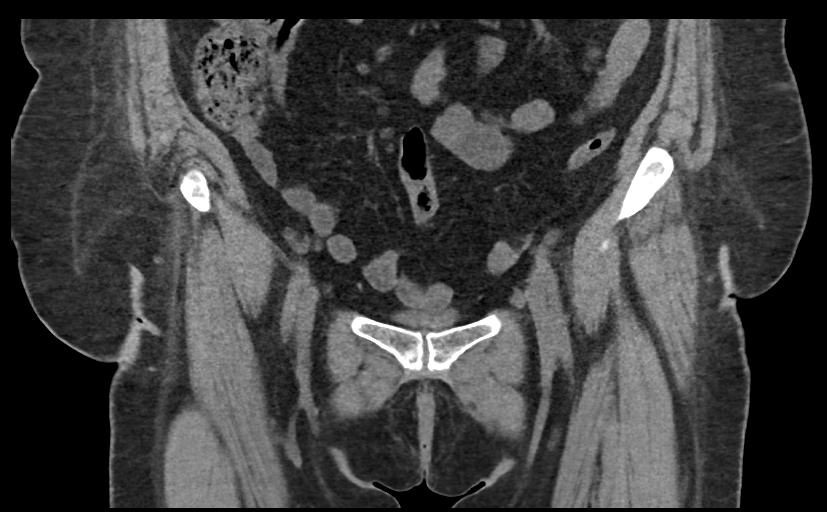
[im 97/161  soft-tissue]
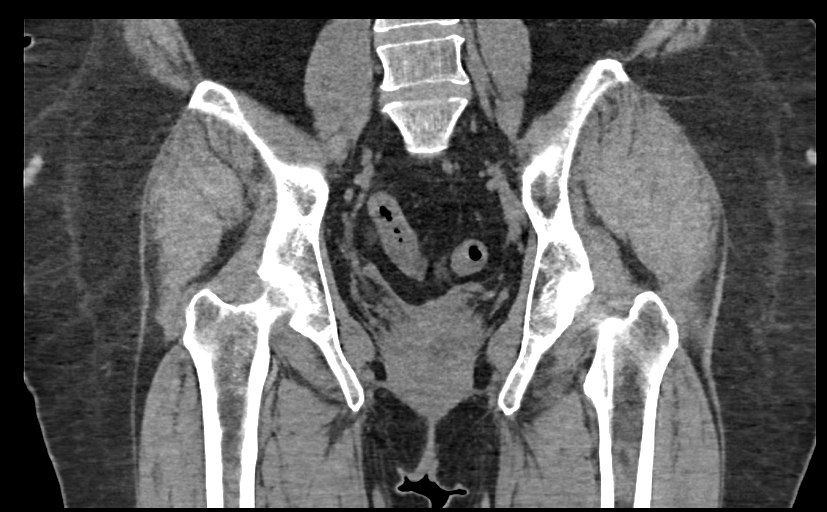

[15 of 46 positions shown; findings below may reference images not displayed]

FINDINGS: Urinary Tract: Distal ureters and urinary bladder are normal in
appearance.

Bowel: Visualized portions of small bowel and colon are normal in
appearance. Postoperative changes adjacent to the cecum from prior
appendectomy.

Vascular/Lymphatic: Minimal atherosclerotic calcifications in the
pelvic vasculature, without evidence of aneurysm. No lymphadenopathy
in the pelvis.

Reproductive: Status post hysterectomy. Ovaries are not confidently
identified may be surgically absent or atrophic.

Other:  No significant volume of ascites.  No pneumoperitoneum.

Musculoskeletal: Calcifications in the anterior aspect of the left
hip joint, likely to reflect some chondrocalcinosis of the anterior
joint capsule. There are no aggressive appearing lytic or blastic
lesions noted in the visualized portions of the skeleton.
IMPRESSION: 1. No acute findings in the pelvis to account for the patient's
symptoms. Specifically, no hernia no findings to suggest bowel
incarceration or obstruction.
2. Chondrocalcinosis of the anterior joint capsule in the left hip
joint.

## 2018-10-04 ENCOUNTER — Other Ambulatory Visit: Payer: Self-pay | Admitting: Physician Assistant

## 2018-10-04 DIAGNOSIS — I10 Essential (primary) hypertension: Secondary | ICD-10-CM

## 2018-10-08 ENCOUNTER — Other Ambulatory Visit: Payer: Self-pay | Admitting: Physician Assistant

## 2018-10-08 DIAGNOSIS — R600 Localized edema: Secondary | ICD-10-CM

## 2018-10-08 MED ORDER — FUROSEMIDE 40 MG PO TABS: 40.0000 mg | ORAL_TABLET | Freq: Two times a day (BID) | ORAL | 0 refills | Status: DC

## 2018-10-08 NOTE — Addendum Note (Signed)
Addended by: Narda Rutherford on: 10/08/2018 04:41 PM   Modules accepted: Orders

## 2018-11-02 ENCOUNTER — Other Ambulatory Visit: Payer: Self-pay | Admitting: Physician Assistant

## 2018-11-02 DIAGNOSIS — I10 Essential (primary) hypertension: Secondary | ICD-10-CM

## 2018-11-12 ENCOUNTER — Other Ambulatory Visit (HOSPITAL_COMMUNITY): Payer: Self-pay

## 2018-11-12 MED ORDER — LAMOTRIGINE 200 MG PO TABS: 200.0000 mg | ORAL_TABLET | Freq: Every day | ORAL | 0 refills | Status: DC

## 2018-11-16 ENCOUNTER — Other Ambulatory Visit: Payer: Self-pay | Admitting: Physician Assistant

## 2018-11-16 DIAGNOSIS — I10 Essential (primary) hypertension: Secondary | ICD-10-CM

## 2018-11-25 ENCOUNTER — Encounter (HOSPITAL_COMMUNITY): Payer: Self-pay | Admitting: Medical

## 2018-11-25 ENCOUNTER — Ambulatory Visit (INDEPENDENT_AMBULATORY_CARE_PROVIDER_SITE_OTHER): Payer: Self-pay | Admitting: Medical

## 2018-11-25 DIAGNOSIS — Z6837 Body mass index (BMI) 37.0-37.9, adult: Secondary | ICD-10-CM

## 2018-11-25 DIAGNOSIS — Z639 Problem related to primary support group, unspecified: Secondary | ICD-10-CM

## 2018-11-25 DIAGNOSIS — M5136 Other intervertebral disc degeneration, lumbar region: Secondary | ICD-10-CM

## 2018-11-25 DIAGNOSIS — Z8739 Personal history of other diseases of the musculoskeletal system and connective tissue: Secondary | ICD-10-CM

## 2018-11-25 DIAGNOSIS — Z62819 Personal history of unspecified abuse in childhood: Secondary | ICD-10-CM

## 2018-11-25 DIAGNOSIS — G894 Chronic pain syndrome: Secondary | ICD-10-CM

## 2018-11-25 DIAGNOSIS — F17200 Nicotine dependence, unspecified, uncomplicated: Secondary | ICD-10-CM

## 2018-11-25 DIAGNOSIS — F5105 Insomnia due to other mental disorder: Secondary | ICD-10-CM

## 2018-11-25 DIAGNOSIS — F3181 Bipolar II disorder: Secondary | ICD-10-CM

## 2018-11-25 DIAGNOSIS — F4312 Post-traumatic stress disorder, chronic: Secondary | ICD-10-CM

## 2018-11-25 DIAGNOSIS — M1610 Unilateral primary osteoarthritis, unspecified hip: Secondary | ICD-10-CM

## 2018-11-25 DIAGNOSIS — F99 Mental disorder, not otherwise specified: Secondary | ICD-10-CM

## 2018-11-25 MED ORDER — DULOXETINE HCL 60 MG PO CPEP: 60.0000 mg | ORAL_CAPSULE | Freq: Every day | ORAL | 0 refills | Status: DC

## 2018-11-25 MED ORDER — FLUOXETINE HCL 20 MG PO CAPS: 20.0000 mg | ORAL_CAPSULE | Freq: Every day | ORAL | 0 refills | Status: DC

## 2018-11-25 NOTE — Progress Notes (Signed)
Virtual Visit via Telephone Note  I connected with Kathryn Cline on 11/25/18 at  2:00 PM EDT by telephone and verified that I am speaking with the correct person using two identifiers.   I discussed the limitations, risks, security and privacy concerns of performing an evaluation and management service by telephone and the availability of in person appointments. I also discussed with the patient that there may be a patient responsible charge related to this service. The patient expressed understanding and agreed to proceed.   History of Present Illness: At Last Visit 09/02/2018: Kathryn Cline reported her grandson was born last Friday (can hear him crying in background).Husband started new job today.They had gotten behind in rent due to [place].She says her medications are working and doesnt need any changes. House has settled down with just she,her husband and daughter living there with new baby.  Today 11/25/18: Kathryn Cline reports her husband lost his job and she has not been able to afford all her meds.She switched pharmacies to save but could only afford Cymbalta.She reports that she is maintaining on that medication. Husband has gotten job back but insurance hasnt kicked in yet.   Review of [employer]stRevieems: Psychiatric: Agitation: No Hallucination: No Depressed Mood: No Insomnia: No Hypersomnia: No Altered Concentration: No Feels Worthless: Negative Grandiose Ideas: Negative Belief In Special Powers: Negative New/Increased Substance Abuse: Negative Compulsions: Negative  Neurologic: Headache: No Seizure: No Paresthesias: No  Observations/Objective:  General Appearance: phone visit  Eye Contact:  Phone visit  Speech:  Clear and Coherent and Normal Rate  Volume:  Normal  Mood:  Euthymic  Affect:  NA  Thought Process:  Coherent and Descriptions of Associations: Intact  Orientation:  Full (Time, Place, and Person)  Thought Content: WDL   Suicidal Thoughts:  No  Homicidal Thoughts:   No  Memory:  Negative  Judgement:  Intact  Insight:  Present  Psychomotor Activity:  NA  Concentration:  Concentration: Good and Attention Span: Good  Recall:  Good  Fund of Knowledge: WDL  Language: WDL  Akathisia:  NA  Handed:  Right historical  AIMS (if indicated): NA  Assets:   Communication Skills Desire for Improvement Housing Resilience Social Support Transportation  ADL's:  Intact  Cognition WDL  Sleep:  uses OTC sleep aids    Assessment and Plan: Stable for time being;Will send rx to pharmacy so she can get as insurance and cash situation allow   Follow Up Instructions:    I discussed the assessment and treatment plan with the patient. The patient was provided an opportunity to ask questions and all were answered. The patient agreed with the plan and demonstrated an understanding of the instructions.   The patient was advised to call back or seek an in-person evaluation if the symptoms worsen or if the condition fails to improve as anticipated.  I provided 15 minutes of non-face-to-face time during this encounter.   Darlyne Russian, PA-C

## 2019-01-21 ENCOUNTER — Telehealth: Payer: Self-pay | Admitting: Neurology

## 2019-01-21 NOTE — Telephone Encounter (Signed)
We can discuss at 9/21 for how long and what this looks like. Is she working now? Does she need a note until the 21st if so that is ok but I need to document what is going on and what she can't do.

## 2019-01-21 NOTE — Telephone Encounter (Signed)
Patient left a vm asking for Kathryn Cline to write a letter for her to not work due to all of her medical issues. Please advise.

## 2019-01-21 NOTE — Telephone Encounter (Signed)
Left message on machine for patient to call back.

## 2019-01-24 ENCOUNTER — Encounter: Payer: Self-pay | Admitting: Physician Assistant

## 2019-01-24 ENCOUNTER — Ambulatory Visit (INDEPENDENT_AMBULATORY_CARE_PROVIDER_SITE_OTHER): Payer: Self-pay | Admitting: Physician Assistant

## 2019-01-24 VITALS — Temp 98.0°F | Ht 67.0 in | Wt 276.0 lb

## 2019-01-24 DIAGNOSIS — I10 Essential (primary) hypertension: Secondary | ICD-10-CM

## 2019-01-24 DIAGNOSIS — M797 Fibromyalgia: Secondary | ICD-10-CM

## 2019-01-24 DIAGNOSIS — F4312 Post-traumatic stress disorder, chronic: Secondary | ICD-10-CM

## 2019-01-24 DIAGNOSIS — G629 Polyneuropathy, unspecified: Secondary | ICD-10-CM

## 2019-01-24 DIAGNOSIS — G43001 Migraine without aura, not intractable, with status migrainosus: Secondary | ICD-10-CM

## 2019-01-24 DIAGNOSIS — J4541 Moderate persistent asthma with (acute) exacerbation: Secondary | ICD-10-CM

## 2019-01-24 DIAGNOSIS — F411 Generalized anxiety disorder: Secondary | ICD-10-CM

## 2019-01-24 DIAGNOSIS — F339 Major depressive disorder, recurrent, unspecified: Secondary | ICD-10-CM

## 2019-01-24 MED ORDER — ATENOLOL 50 MG PO TABS: 50.0000 mg | ORAL_TABLET | Freq: Every day | ORAL | 1 refills | Status: DC

## 2019-01-24 MED ORDER — IBUPROFEN 800 MG PO TABS: ORAL_TABLET | ORAL | 2 refills | Status: DC

## 2019-01-24 NOTE — Progress Notes (Signed)
Not currently working (hasn't for years) Needs a letter to apply for food stamps stating she is not currently able to work  Due to fibromyalgia, neuropathy
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
## 2019-04-06 ENCOUNTER — Ambulatory Visit (INDEPENDENT_AMBULATORY_CARE_PROVIDER_SITE_OTHER): Payer: BLUE CROSS/BLUE SHIELD

## 2019-04-06 ENCOUNTER — Ambulatory Visit (INDEPENDENT_AMBULATORY_CARE_PROVIDER_SITE_OTHER): Payer: BLUE CROSS/BLUE SHIELD | Admitting: Physician Assistant

## 2019-04-06 ENCOUNTER — Encounter: Payer: Self-pay | Admitting: Physician Assistant

## 2019-04-06 ENCOUNTER — Other Ambulatory Visit: Payer: Self-pay | Admitting: Neurology

## 2019-04-06 ENCOUNTER — Other Ambulatory Visit: Payer: Self-pay

## 2019-04-06 VITALS — BP 146/81 | HR 72 | Ht 68.0 in | Wt 283.0 lb

## 2019-04-06 DIAGNOSIS — I10 Essential (primary) hypertension: Secondary | ICD-10-CM | POA: Diagnosis not present

## 2019-04-06 DIAGNOSIS — G629 Polyneuropathy, unspecified: Secondary | ICD-10-CM

## 2019-04-06 DIAGNOSIS — R7309 Other abnormal glucose: Secondary | ICD-10-CM | POA: Diagnosis not present

## 2019-04-06 DIAGNOSIS — Z1231 Encounter for screening mammogram for malignant neoplasm of breast: Secondary | ICD-10-CM | POA: Diagnosis not present

## 2019-04-06 DIAGNOSIS — M25571 Pain in right ankle and joints of right foot: Secondary | ICD-10-CM | POA: Insufficient documentation

## 2019-04-06 DIAGNOSIS — S86111A Strain of other muscle(s) and tendon(s) of posterior muscle group at lower leg level, right leg, initial encounter: Secondary | ICD-10-CM | POA: Insufficient documentation

## 2019-04-06 DIAGNOSIS — R519 Headache, unspecified: Secondary | ICD-10-CM | POA: Diagnosis not present

## 2019-04-06 DIAGNOSIS — R222 Localized swelling, mass and lump, trunk: Secondary | ICD-10-CM | POA: Insufficient documentation

## 2019-04-06 DIAGNOSIS — Z79899 Other long term (current) drug therapy: Secondary | ICD-10-CM | POA: Diagnosis not present

## 2019-04-06 DIAGNOSIS — M25572 Pain in left ankle and joints of left foot: Secondary | ICD-10-CM

## 2019-04-06 DIAGNOSIS — S93402A Sprain of unspecified ligament of left ankle, initial encounter: Secondary | ICD-10-CM | POA: Diagnosis not present

## 2019-04-06 DIAGNOSIS — E559 Vitamin D deficiency, unspecified: Secondary | ICD-10-CM | POA: Diagnosis not present

## 2019-04-06 MED ORDER — FOLTANX RF 3-90.314-2-35 MG PO CAPS: 1.0000 | ORAL_CAPSULE | Freq: Two times a day (BID) | ORAL | 5 refills | Status: DC

## 2019-04-06 MED ORDER — METHYLPREDNISOLONE SODIUM SUCC 125 MG IJ SOLR
125.0000 mg | Freq: Once | INTRAMUSCULAR | Status: AC
  Administered 2019-04-06: 125 mg via INTRAMUSCULAR

## 2019-04-06 MED ORDER — KETOROLAC TROMETHAMINE 60 MG/2ML IM SOLN
60.0000 mg | Freq: Once | INTRAMUSCULAR | Status: AC
  Administered 2019-04-06: 11:00:00 60 mg via INTRAMUSCULAR

## 2019-04-06 MED ORDER — CYCLOBENZAPRINE HCL 10 MG PO TABS: 10.0000 mg | ORAL_TABLET | Freq: Three times a day (TID) | ORAL | 0 refills | Status: DC | PRN

## 2019-04-06 NOTE — Patient Instructions (Addendum)
Peripheral Neuropathy Peripheral neuropathy is a type of nerve damage. It affects nerves that carry signals between the spinal cord and the arms, legs, and the rest of the body (peripheral nerves). It does not affect nerves in the spinal cord or brain. In peripheral neuropathy, one nerve or a group of nerves may be damaged. Peripheral neuropathy is a broad category that includes many specific nerve disorders, like diabetic neuropathy, hereditary neuropathy, and carpal tunnel syndrome. What are the causes? This condition may be caused by:  Diabetes. This is the most common cause of peripheral neuropathy.  Nerve injury.  Pressure or stress on a nerve that lasts a long time.  Lack (deficiency) of B vitamins. This can result from alcoholism, poor diet, or a restricted diet.  Infections.  Autoimmune diseases, such as rheumatoid arthritis and systemic lupus erythematosus.  Nerve diseases that are passed from parent to child (inherited).  Some medicines, such as cancer medicines (chemotherapy).  Poisonous (toxic) substances, such as lead and mercury.  Too little blood flowing to the legs.  Kidney disease.  Thyroid disease. In some cases, the cause of this condition is not known. What are the signs or symptoms? Symptoms of this condition depend on which of your nerves is damaged. Common symptoms include:  Loss of feeling (numbness) in the feet, hands, or both.  Tingling in the feet, hands, or both.  Burning pain.  Very sensitive skin.  Weakness.  Not being able to move a part of the body (paralysis).  Muscle twitching.  Clumsiness or poor coordination.  Loss of balance.  Not being able to control your bladder.  Feeling dizzy.  Sexual problems. How is this diagnosed? Diagnosing and finding the cause of peripheral neuropathy can be difficult. Your health care provider will take your medical history and do a physical exam. A neurological exam will also be done. This  involves checking things that are affected by your brain, spinal cord, and nerves (nervous system). For example, your health care provider will check your reflexes, how you move, and what you can feel. You may have other tests, such as:  Blood tests.  Electromyogram (EMG) and nerve conduction tests. These tests check nerve function and how well the nerves are controlling the muscles.  Imaging tests, such as CT scans or MRI to rule out other causes of your symptoms.  Removing a small piece of nerve to be examined in a lab (nerve biopsy). This is rare.  Removing and examining a small amount of the fluid that surrounds the brain and spinal cord (lumbar puncture). This is rare. How is this treated? Treatment for this condition may involve:  Treating the underlying cause of the neuropathy, such as diabetes, kidney disease, or vitamin deficiencies.  Stopping medicines that can cause neuropathy, such as chemotherapy.  Medicine to relieve pain. Medicines may include: ? Prescription or over-the-counter pain medicine. ? Antiseizure medicine. ? Antidepressants. ? Pain-relieving patches that are applied to painful areas of skin.  Surgery to relieve pressure on a nerve or to destroy a nerve that is causing pain.  Physical therapy to help improve movement and balance.  Devices to help you move around (assistive devices). Follow these instructions at home: Medicines  Take over-the-counter and prescription medicines only as told by your health care provider. Do not take any other medicines without first asking your health care provider.  Do not drive or use heavy machinery while taking prescription pain medicine. Lifestyle   Do not use any products that contain nicotine   or tobacco, such as cigarettes and e-cigarettes. Smoking keeps blood from reaching damaged nerves. If you need help quitting, ask your health care provider.  Avoid or limit alcohol. Too much alcohol can cause a vitamin B  deficiency, and vitamin B is needed for healthy nerves.  Eat a healthy diet. This includes: ? Eating foods that are high in fiber, such as fresh fruits and vegetables, whole grains, and beans. ? Limiting foods that are high in fat and processed sugars, such as fried or sweet foods. General instructions   If you have diabetes, work closely with your health care provider to keep your blood sugar under control.  If you have numbness in your feet: ? Check every day for signs of injury or infection. Watch for redness, warmth, and swelling. ? Wear padded socks and comfortable shoes. These help protect your feet.  Develop a good support system. Living with peripheral neuropathy can be stressful. Consider talking with a mental health specialist or joining a support group.  Use assistive devices and attend physical therapy as told by your health care provider. This may include using a walker or a cane.  Keep all follow-up visits as told by your health care provider. This is important. Contact a health care provider if:  You have new signs or symptoms of peripheral neuropathy.  You are struggling emotionally from dealing with peripheral neuropathy.  Your pain is not well-controlled. Get help right away if:  You have an injury or infection that is not healing normally.  You develop new weakness in an arm or leg.  You fall frequently. Summary  Peripheral neuropathy is when the nerves in the arms, or legs are damaged, resulting in numbness, weakness, or pain.  There are many causes of peripheral neuropathy, including diabetes, pinched nerves, vitamin deficiencies, autoimmune disease, and hereditary conditions.  Diagnosing and finding the cause of peripheral neuropathy can be difficult. Your health care provider will take your medical history, do a physical exam, and do tests, including blood tests and nerve function tests.  Treatment involves treating the underlying cause of the  neuropathy and taking medicines to help control pain. Physical therapy and assistive devices may also help. This information is not intended to replace advice given to you by your health care provider. Make sure you discuss any questions you have with your health care provider. Document Released: 04/11/2002 Document Revised: 04/03/2017 Document Reviewed: 06/30/2016 Elsevier Patient Education  2020 [place].    Lipoma  A lipoma is a noncancerous (benign) tumor that is made up of fat cells. This is a very common type of soft-tissue growth. Lipomas are usually found under the skin (subcutaneous). They may occur in any tissue of the body that contains fat. Common areas for lipomas to appear include the back, shoulders, buttocks, and thighs.  Lipomas grow slowly, and they are usually painless. Most lipomas do not cause problems and do not require treatment. What are the causes? The cause of this condition is not known. What increases the risk? You are more likely to develop this condition if:  You are 4-66 years old.  You have a family history of lipomas. What are the signs or symptoms? A lipoma usually appears as a small, round bump under the skin. In most cases, the lump will:  Feel soft or rubbery.  Not cause pain or other symptoms. However, if a lipoma is located in an area where it pushes on nerves, it can become painful or cause other symptoms. How is  this diagnosed? A lipoma can usually be diagnosed with a physical exam. You may also have tests to confirm the diagnosis and to rule out other conditions. Tests may include:  Imaging tests, such as a CT scan or MRI.  Removal of a tissue sample to be looked at under a microscope (biopsy). How is this treated? Treatment for this condition depends on the size of the lipoma and whether it is causing any symptoms.  For small lipomas that are not causing problems, no treatment is needed.  If a lipoma is bigger or it causes  problems, surgery may be done to remove the lipoma. Lipomas can also be removed to improve appearance. Most often, the procedure is done after applying a medicine that numbs the area (local anesthetic). Follow these instructions at home:  Watch your lipoma for any changes.  Keep all follow-up visits as told by your health care provider. This is important. Contact a health care provider if:  Your lipoma becomes larger or hard.  Your lipoma becomes painful, red, or increasingly swollen. These could be signs of infection or a more serious condition. Get help right away if:  You develop tingling or numbness in an area near the lipoma. This could indicate that the lipoma is causing nerve damage. Summary  A lipoma is a noncancerous tumor that is made up of fat cells.  Most lipomas do not cause problems and do not require treatment.  If a lipoma is bigger or it causes problems, surgery may be done to remove the lipoma. This information is not intended to replace advice given to you by your health care provider. Make sure you discuss any questions you have with your health care provider. Document Released: 04/11/2002 Document Revised: 04/07/2017 Document Reviewed: 04/07/2017 Elsevier Patient Education  [place].
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
## 2019-04-06 NOTE — Telephone Encounter (Signed)
After you saw patient, she stated she wanted to talk about restarting weight loss medication. It looks like weight loss medications on her chart are phentermine, belviq, and Saxenda in the past. Please advise.

## 2019-04-07 NOTE — Progress Notes (Signed)
Call pt: no fracture.

## 2019-04-08 MED ORDER — SAXENDA 18 MG/3ML ~~LOC~~ SOPN: 0.6000 mg | PEN_INJECTOR | Freq: Every day | SUBCUTANEOUS | 1 refills | Status: DC

## 2019-04-08 NOTE — Addendum Note (Signed)
Addended byAnnamaria Helling on: 04/08/2019 04:47 PM   Modules accepted: Orders

## 2019-04-08 NOTE — Telephone Encounter (Signed)
Patient okay with restarting Saxenda. Please send RX to [facility] on file.

## 2019-04-08 NOTE — Telephone Encounter (Signed)
Kathryn Cline is the best long term weight loss options. Is she interested in restarting that?

## 2019-04-08 NOTE — Addendum Note (Signed)
Addended by: Donella Stade on: 04/08/2019 04:58 PM   Modules accepted: Orders

## 2019-04-08 NOTE — Progress Notes (Signed)
Call pt: b12 looks good. Kidney and sugars look good.A1C normal. One liver enzyme up just a hair. Thyroid normal. No anemia.

## 2019-04-09 LAB — VITAMIN D 1,25 DIHYDROXY
Vitamin D 1, 25 (OH)2 Total: 34 pg/mL (ref 18–72)
Vitamin D2 1, 25 (OH)2: 10 pg/mL
Vitamin D3 1, 25 (OH)2: 24 pg/mL

## 2019-04-09 LAB — CBC
HCT: 38.6 % (ref 35.0–45.0)
Hemoglobin: 13.3 g/dL (ref 11.7–15.5)
MCH: 30 pg (ref 27.0–33.0)
MCHC: 34.5 g/dL (ref 32.0–36.0)
MCV: 87.1 fL (ref 80.0–100.0)
MPV: 11.4 fL (ref 7.5–12.5)
Platelets: 219 10*3/uL (ref 140–400)
RBC: 4.43 10*6/uL (ref 3.80–5.10)
RDW: 13.1 % (ref 11.0–15.0)
WBC: 7.5 10*3/uL (ref 3.8–10.8)

## 2019-04-09 LAB — B12 AND FOLATE PANEL
Folate: 18.1 ng/mL
Vitamin B-12: 486 pg/mL (ref 200–1100)

## 2019-04-09 LAB — COMPLETE METABOLIC PANEL WITH GFR
AG Ratio: 2.2 (calc) (ref 1.0–2.5)
ALT: 35 U/L — ABNORMAL HIGH (ref 6–29)
AST: 30 U/L (ref 10–35)
Albumin: 4.7 g/dL (ref 3.6–5.1)
Alkaline phosphatase (APISO): 52 U/L (ref 31–125)
BUN: 11 mg/dL (ref 7–25)
CO2: 30 mmol/L (ref 20–32)
Calcium: 9.5 mg/dL (ref 8.6–10.2)
Chloride: 101 mmol/L (ref 98–110)
Creat: 0.61 mg/dL (ref 0.50–1.10)
GFR, Est African American: 127 mL/min/{1.73_m2} (ref >=60)
GFR, Est Non African American: 109 mL/min/{1.73_m2} (ref >=60)
Globulin: 2.1 g/dL (calc) (ref 1.9–3.7)
Glucose, Bld: 93 mg/dL (ref 65–99)
Potassium: 4.2 mmol/L (ref 3.5–5.3)
Sodium: 140 mmol/L (ref 135–146)
Total Bilirubin: 0.7 mg/dL (ref 0.2–1.2)
Total Protein: 6.8 g/dL (ref 6.1–8.1)

## 2019-04-09 LAB — HEMOGLOBIN A1C
Hgb A1c MFr Bld: 5.1 % of total Hgb (ref <5.7)
Mean Plasma Glucose: 100 (calc)
eAG (mmol/L): 5.5 (calc)

## 2019-04-09 LAB — FERRITIN: Ferritin: 38 ng/mL (ref 16–232)

## 2019-04-09 LAB — TSH: TSH: 2.41 mIU/L

## 2019-04-26 ENCOUNTER — Encounter: Payer: Self-pay | Admitting: Physician Assistant

## 2019-04-26 ENCOUNTER — Ambulatory Visit (INDEPENDENT_AMBULATORY_CARE_PROVIDER_SITE_OTHER): Payer: BLUE CROSS/BLUE SHIELD | Admitting: Physician Assistant

## 2019-04-26 VITALS — Ht 68.0 in | Wt 283.0 lb

## 2019-04-26 DIAGNOSIS — J0100 Acute maxillary sinusitis, unspecified: Secondary | ICD-10-CM | POA: Diagnosis not present

## 2019-04-26 DIAGNOSIS — Z20828 Contact with and (suspected) exposure to other viral communicable diseases: Secondary | ICD-10-CM | POA: Diagnosis not present

## 2019-04-26 DIAGNOSIS — J069 Acute upper respiratory infection, unspecified: Secondary | ICD-10-CM

## 2019-04-26 DIAGNOSIS — Z20822 Contact with and (suspected) exposure to covid-19: Secondary | ICD-10-CM

## 2019-04-26 MED ORDER — METHYLPREDNISOLONE 4 MG PO TBPK: ORAL_TABLET | ORAL | 0 refills | Status: DC

## 2019-04-26 MED ORDER — AMOXICILLIN-POT CLAVULANATE 875-125 MG PO TABS: 1.0000 | ORAL_TABLET | Freq: Two times a day (BID) | ORAL | 0 refills | Status: DC
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
## 2019-04-26 NOTE — Progress Notes (Signed)
Started Sunday, getting worse: Headache  Facial pressure Cough Bloody mucous in nose Scratchy throat Feels "clammy"  No n/v, diarrhea, no change in taste/smell  Has taken tylenol/ibuprofen Hasn't taken temperature, but doesn't "feel warm"

## 2019-04-27 ENCOUNTER — Encounter: Payer: Self-pay | Admitting: Physician Assistant

## 2019-05-25 ENCOUNTER — Other Ambulatory Visit: Payer: Self-pay

## 2019-05-25 ENCOUNTER — Ambulatory Visit (INDEPENDENT_AMBULATORY_CARE_PROVIDER_SITE_OTHER): Payer: BC Managed Care – PPO | Admitting: Sports Medicine

## 2019-05-25 DIAGNOSIS — S6401XS Injury of ulnar nerve at wrist and hand level of right arm, sequela: Secondary | ICD-10-CM | POA: Diagnosis not present

## 2019-05-25 DIAGNOSIS — S6401XA Injury of ulnar nerve at wrist and hand level of right arm, initial encounter: Secondary | ICD-10-CM | POA: Insufficient documentation

## 2019-05-25 DIAGNOSIS — R202 Paresthesia of skin: Secondary | ICD-10-CM | POA: Diagnosis not present

## 2019-05-25 DIAGNOSIS — R200 Anesthesia of skin: Secondary | ICD-10-CM | POA: Diagnosis not present

## 2019-05-25 NOTE — Progress Notes (Signed)
    Procedures performed today:    None.  Independent interpretation of tests performed by another provider:   She did have cervical spine x-rays done some time ago, she does have a bit of loss of cervical lordosis but no obvious degenerative disc disease.  Impression and Recommendations:    Numbness and tingling in left hand Once this 44 year old female has had numbness and tingling in her first through third fingers of the left hand. On exam she has a positive Tinel's and Phalen signs. This is likely carpal tunnel syndrome. She was initially referred to me because of a nontender mass over her left trapezius, suspected to be the cause of the symptoms. The mass feels like a lipoma and is likely not related. We are going to start with nighttime splinting for 6 weeks followed by nerve conduction study or nerve Hydro dissection if no better in 6 weeks.  Chronic right ulnar nerve neurotmesis Junetta also has chronic numbness at the right hand in the ulnar nerve distribution, she did have an injury that involved transection of the artery and the ulnar nerve, and her ulnar distribution paresthesias will likely be permanent due to the initial neurotmesis. No further evaluation or intervention needed.    ___________________________________________ Gwen Her. Dianah Field, M.D., ABFM., CAQSM. Primary Care and [place] Instructor of [place] [facility]

## 2019-05-25 NOTE — Assessment & Plan Note (Signed)
Once this 44 year old female has had numbness and tingling in her first through third fingers of the left hand. On exam she has a positive Tinel's and Phalen signs. This is likely carpal tunnel syndrome. She was initially referred to me because of a nontender mass over her left trapezius, suspected to be the cause of the symptoms. The mass feels like a lipoma and is likely not related. We are going to start with nighttime splinting for 6 weeks followed by nerve conduction study or nerve Hydro dissection if no better in 6 weeks.

## 2019-05-25 NOTE — Assessment & Plan Note (Signed)
Rashada also has chronic numbness at the right hand in the ulnar nerve distribution, she did have an injury that involved transection of the artery and the ulnar nerve, and her ulnar distribution paresthesias will likely be permanent due to the initial neurotmesis. No further evaluation or intervention needed.

## 2019-06-09 ENCOUNTER — Other Ambulatory Visit: Payer: Self-pay

## 2019-06-09 ENCOUNTER — Ambulatory Visit (INDEPENDENT_AMBULATORY_CARE_PROVIDER_SITE_OTHER): Payer: BC Managed Care – PPO | Admitting: Nurse Practitioner

## 2019-06-09 ENCOUNTER — Encounter: Payer: Self-pay | Admitting: Nurse Practitioner

## 2019-06-09 VITALS — BP 144/82 | HR 83 | Temp 98.1°F | Ht 68.0 in | Wt 292.1 lb

## 2019-06-09 DIAGNOSIS — K047 Periapical abscess without sinus: Secondary | ICD-10-CM

## 2019-06-09 DIAGNOSIS — M2669 Other specified disorders of temporomandibular joint: Secondary | ICD-10-CM

## 2019-06-09 MED ORDER — FLUCONAZOLE 150 MG PO TABS: 150.0000 mg | ORAL_TABLET | Freq: Once | ORAL | 0 refills | Status: AC

## 2019-06-09 MED ORDER — CYCLOBENZAPRINE HCL 10 MG PO TABS: 10.0000 mg | ORAL_TABLET | Freq: Every day | ORAL | 1 refills | Status: DC

## 2019-06-09 MED ORDER — AMOXICILLIN-POT CLAVULANATE 875-125 MG PO TABS: 1.0000 | ORAL_TABLET | Freq: Two times a day (BID) | ORAL | 0 refills | Status: DC

## 2019-06-09 MED ORDER — IBUPROFEN 800 MG PO TABS: ORAL_TABLET | ORAL | 3 refills | Status: DC
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
## 2019-06-09 NOTE — Patient Instructions (Addendum)
IF YOUR SYMPTOMS DO NOT IMPROVE IN 3-4 DAYS THEN CALL THE OFFICE BACK AND WE MAY NEED TO MAKE A CHANGE TO THE MEDICATION.  IF YOU DEVELOP A HIGH FEVER OR REDNESS TO THE AREA THEN CALL us BACK OR SEEK CARE AT THE URGENT CARE OR EMERGENCY ROOM.    Temporomandibular Joint Syndrome  Temporomandibular joint syndrome (TMJ syndrome) is a condition that causes pain in the temporomandibular joints. These joints are located near your ears and allow your jaw to open and close. For people with TMJ syndrome, chewing, biting, or other movements of the jaw can be difficult or painful. TMJ syndrome is often mild and goes away within a few weeks. However, sometimes the condition becomes a long-term (chronic) problem. What are the causes? This condition may be caused by:  Grinding your teeth or clenching your jaw. Some people do this when they are under stress.  Arthritis.  Injury to the jaw.  Head or neck injury.  Teeth or dentures that are not aligned well. In some cases, the cause of TMJ syndrome may not be known. What are the signs or symptoms? The most common symptom of this condition is an aching pain on the side of the head in the area of the TMJ. Other symptoms may include:  Pain when moving your jaw, such as when chewing or biting.  Being unable to open your jaw all the way.  Making a clicking sound when you open your mouth.  Headache.  Earache.  Neck or shoulder pain. How is this diagnosed? This condition may be diagnosed based on:  Your symptoms and medical history.  A physical exam. Your health care provider may check the range of motion of your jaw.  Imaging tests, such as X-rays or an MRI. You may also need to see your dentist, who will determine if your teeth and jaw are lined up correctly. How is this treated? TMJ syndrome often goes away on its own. If treatment is needed, the options may include:  Eating soft foods and applying ice or heat.  Medicines to relieve pain  or inflammation.  Medicines or massage to relax the muscles.  A splint, bite plate, or mouthpiece to prevent teeth grinding or jaw clenching.  Relaxation techniques or counseling to help reduce stress.  A therapy for pain in which an electrical current is applied to the nerves through the skin (transcutaneous electrical nerve stimulation).  Acupuncture. This is sometimes helpful to relieve pain.  Jaw surgery. This is rarely needed. Follow these instructions at home:  Eating and drinking  Eat a soft diet if you are having trouble chewing.  Avoid foods that require a lot of chewing. Do not chew gum. General instructions  Take over-the-counter and prescription medicines only as told by your health care provider.  If directed, put ice on the painful area. ? Put ice in a plastic bag. ? Place a towel between your skin and the bag. ? Leave the ice on for 20 minutes, 2-3 times a day.  Apply a warm, wet cloth (warm compress) to the painful area as directed.  Massage your jaw area and do any jaw stretching exercises as told by your health care provider.  If you were given a splint, bite plate, or mouthpiece, wear it as told by your health care provider.  Keep all follow-up visits as told by your health care provider. This is important. Contact a health care provider if:  You are having trouble eating.  You have new  or worsening symptoms. Get help right away if:  Your jaw locks open or closed. Summary  Temporomandibular joint syndrome (TMJ syndrome) is a condition that causes pain in the temporomandibular joints. These joints are located near your ears and allow your jaw to open and close.  TMJ syndrome is often mild and goes away within a few weeks. However, sometimes the condition becomes a long-term (chronic) problem.  Symptoms include an aching pain on the side of the head in the area of the TMJ, pain when chewing or biting, and being unable to open your jaw all the way.  You may also make a clicking sound when you open your mouth.  TMJ syndrome often goes away on its own. If treatment is needed, it may include medicines to relieve pain, reduce inflammation, or relax the muscles. A splint, bite plate, or mouthpiece may also be used to prevent teeth grinding or jaw clenching. This information is not intended to replace advice given to you by your health care provider. Make sure you discuss any questions you have with your health care provider. Document Revised: 07/03/2017 Document Reviewed: 06/02/2017   Jaw Range of Motion Exercises Jaw range of motion exercises are exercises that help your jaw move better. Exercises that help you have good posture (postural exercises) also help relieve jaw discomfort. These are often done along with range of motion exercises. These exercises can help prevent or improve:  Difficulty opening your mouth.  Pain in your jaw while it is open or closed.  Temporomandibular joint (TMJ) pain.  Headache caused by jaw tension. Take other actions to prevent or relieve jaw pain, such as:  Avoiding things that cause or increase jaw pain. This may include: ? Chewing gum or eating hard foods. ? Clenching your jaw or teeth, grinding your teeth, or keeping tension in your jaw muscles. ? Opening your mouth wide, such as for a big yawn. ? Leaning on your jaw, such as resting your jaw in your hand while leaning on a desk.  Putting ice on your jaw. ? Put ice in a plastic bag. ? Place a towel between your skin and the bag. ? Leave the ice on for 10-15 minutes, 2-3 times a day. Only do jaw exercises that your health care provider approves of. Only move your jaw as far as it can comfortably go in each direction. Do not move your jaw into positions that cause pain. Range of motion exercises Repeat each of these exercises 8 times, 1-2 times a day, or as told by your health care provider. Exercise A: Forward protrusion 1. Push your jaw forward.  Hold this position for 1-2 seconds. 2. Allow your jaw to return to its normal position and rest it there for 1-2 seconds. Exercise B: Controlled opening 1. Stand or sit in front of a mirror. Place your tongue on the roof of your mouth, just behind your top teeth. 2. Keeping your tongue on the roof of your mouth, slowly open and close your mouth. 3. While you open and close your mouth, watch your jaw in the mirror. Try to keep your jaw from moving to one side or the other. Exercise C: Right and left motion 1. Move your jaw right. Hold this position for 1-2 seconds. Allow your jaw to return to its normal position, and rest it there for 1-2 seconds. 2. Move your jaw left. Hold this position for 1-2 seconds. Allow your jaw to return to its normal position, and rest it there for  1-2 seconds. Postural exercises Exercise A: Chin tucks 1. You can do this exercise sitting, standing, or lying down. 2. Move your head straight back, keeping your head level. You can guide the movement by placing your fingers on your chin to push your jaw back in an even motion. You should be able to feel a double chin form at the end of the motion. 3. Hold this position for 5 seconds. Repeat 10-15 times. Exercise B: Shoulder blade squeeze 1. Sit or stand. 2. Bend your elbows to about 90 degrees, which is the shape of a capital letter "L." Keep your upper arms by your body. 3. Squeeze your shoulder blades down and back, as though you were trying to touch your elbows behind you. Do not shrug your shoulders or move your head. 4. Hold this position for 5 seconds. Repeat 10-15 times. Exercise C: Chest stretch 1. Stand facing a corner. 2. Put both of your hands and your forearms on the wall, with your arms wide apart. 3. Make sure your arms are at a 90-degree angle to your body. This means that you should hold your arms straight out from your body, level with the floor. 4. Step in toward the corner. Do not lean in. 5. Hold this  position for 30 seconds. Repeat 3 times. Contact a health care provider if you have:  Jaw pain that is new or gets worse.  Clicking or popping sounds while doing the exercises. Get help right away if:  Your jaw is stuck in one place and you cannot move it.  You cannot open or close your mouth. This information is not intended to replace advice given to you by your health care provider. Make sure you discuss any questions you have with your health care provider. Document Revised: 08/13/2018 Document Reviewed: 03/18/2017 Elsevier Patient Education  2020 [place] Patient Education  [employer].

## 2019-06-27 ENCOUNTER — Telehealth (HOSPITAL_COMMUNITY): Payer: Self-pay

## 2019-06-27 NOTE — Telephone Encounter (Signed)
Pt needs a followup appointment

## 2019-06-27 NOTE — Telephone Encounter (Signed)
Patient wants to know if you can send her medications to [facility] because she can get them cheaper there. She states the pharmacy tried to reach out to [place] and was denied the refills. I have already put pharmacy in patients' chart.

## 2019-06-29 DIAGNOSIS — Z888 Allergy status to other drugs, medicaments and biological substances status: Secondary | ICD-10-CM | POA: Diagnosis not present

## 2019-06-29 DIAGNOSIS — F329 Major depressive disorder, single episode, unspecified: Secondary | ICD-10-CM | POA: Diagnosis not present

## 2019-06-29 DIAGNOSIS — Z79899 Other long term (current) drug therapy: Secondary | ICD-10-CM | POA: Diagnosis not present

## 2019-06-29 DIAGNOSIS — F17210 Nicotine dependence, cigarettes, uncomplicated: Secondary | ICD-10-CM | POA: Diagnosis not present

## 2019-06-29 DIAGNOSIS — M25551 Pain in right hip: Secondary | ICD-10-CM | POA: Diagnosis not present

## 2019-06-29 DIAGNOSIS — G8911 Acute pain due to trauma: Secondary | ICD-10-CM | POA: Diagnosis not present

## 2019-06-29 DIAGNOSIS — S79911A Unspecified injury of right hip, initial encounter: Secondary | ICD-10-CM | POA: Diagnosis not present

## 2019-06-29 DIAGNOSIS — W1839XA Other fall on same level, initial encounter: Secondary | ICD-10-CM | POA: Diagnosis not present

## 2019-06-29 DIAGNOSIS — S7001XA Contusion of right hip, initial encounter: Secondary | ICD-10-CM | POA: Diagnosis not present

## 2019-06-29 DIAGNOSIS — Z043 Encounter for examination and observation following other accident: Secondary | ICD-10-CM | POA: Diagnosis not present

## 2019-06-29 DIAGNOSIS — I10 Essential (primary) hypertension: Secondary | ICD-10-CM | POA: Diagnosis not present

## 2019-06-29 MED ORDER — LIDOCAINE-MENTHOL 3.6-1.25 % EX PTCH
1.00 | MEDICATED_PATCH | CUTANEOUS | Status: DC
Start: 2019-06-30 — End: 2019-06-29

## 2019-06-29 NOTE — Telephone Encounter (Signed)
Left vm informing patient to call and set up a follow up appointment per Juanda Crumble.

## 2019-06-30 ENCOUNTER — Ambulatory Visit (INDEPENDENT_AMBULATORY_CARE_PROVIDER_SITE_OTHER): Payer: BC Managed Care – PPO | Admitting: Medical

## 2019-06-30 ENCOUNTER — Other Ambulatory Visit: Payer: Self-pay

## 2019-06-30 ENCOUNTER — Encounter (HOSPITAL_COMMUNITY): Payer: Self-pay | Admitting: Medical

## 2019-06-30 DIAGNOSIS — F3181 Bipolar II disorder: Secondary | ICD-10-CM | POA: Diagnosis not present

## 2019-06-30 DIAGNOSIS — M1610 Unilateral primary osteoarthritis, unspecified hip: Secondary | ICD-10-CM

## 2019-06-30 DIAGNOSIS — Z6837 Body mass index (BMI) 37.0-37.9, adult: Secondary | ICD-10-CM

## 2019-06-30 DIAGNOSIS — F5105 Insomnia due to other mental disorder: Secondary | ICD-10-CM

## 2019-06-30 DIAGNOSIS — Z62819 Personal history of unspecified abuse in childhood: Secondary | ICD-10-CM | POA: Diagnosis not present

## 2019-06-30 DIAGNOSIS — G894 Chronic pain syndrome: Secondary | ICD-10-CM

## 2019-06-30 DIAGNOSIS — Z639 Problem related to primary support group, unspecified: Secondary | ICD-10-CM

## 2019-06-30 DIAGNOSIS — Z8739 Personal history of other diseases of the musculoskeletal system and connective tissue: Secondary | ICD-10-CM

## 2019-06-30 DIAGNOSIS — F4312 Post-traumatic stress disorder, chronic: Secondary | ICD-10-CM | POA: Diagnosis not present

## 2019-06-30 DIAGNOSIS — M5136 Other intervertebral disc degeneration, lumbar region: Secondary | ICD-10-CM

## 2019-06-30 DIAGNOSIS — F99 Mental disorder, not otherwise specified: Secondary | ICD-10-CM

## 2019-06-30 MED ORDER — DULOXETINE HCL 60 MG PO CPEP: 60.0000 mg | ORAL_CAPSULE | Freq: Every day | ORAL | 1 refills | Status: DC

## 2019-06-30 MED ORDER — FLUOXETINE HCL 20 MG PO CAPS: 20.0000 mg | ORAL_CAPSULE | Freq: Every day | ORAL | 1 refills | Status: DC

## 2019-06-30 MED ORDER — LAMOTRIGINE 200 MG PO TABS: 200.0000 mg | ORAL_TABLET | Freq: Every day | ORAL | 1 refills | Status: DC
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
## 2019-07-06 ENCOUNTER — Ambulatory Visit: Payer: BC Managed Care – PPO | Admitting: Sports Medicine

## 2019-08-19 ENCOUNTER — Encounter: Payer: Self-pay | Admitting: Nurse Practitioner

## 2019-08-19 ENCOUNTER — Other Ambulatory Visit: Payer: Self-pay

## 2019-08-19 ENCOUNTER — Ambulatory Visit (INDEPENDENT_AMBULATORY_CARE_PROVIDER_SITE_OTHER): Payer: BC Managed Care – PPO | Admitting: Nurse Practitioner

## 2019-08-19 VITALS — BP 127/81 | HR 86 | Temp 98.4°F | Ht 68.0 in | Wt 293.1 lb

## 2019-08-19 DIAGNOSIS — N644 Mastodynia: Secondary | ICD-10-CM

## 2019-08-19 MED ORDER — VALACYCLOVIR HCL 1 G PO TABS: 1000.0000 mg | ORAL_TABLET | Freq: Three times a day (TID) | ORAL | 0 refills | Status: DC

## 2019-08-19 NOTE — Patient Instructions (Addendum)
You can try ibuprofen, ice, and tylenol and lidocaine patches to the breast.    Breast Tenderness Breast tenderness is a common problem for women of all ages, but may also occur in men. Breast tenderness may range from mild discomfort to severe pain. In women, the pain usually comes and goes with the menstrual cycle, but it can also be constant. Breast tenderness has many possible causes, including hormone changes, infections, and taking certain medicines. You may have tests, such as a mammogram or an ultrasound, to check for any unusual findings. Having breast tenderness usually does not mean that you have breast cancer. Follow these instructions at home: Managing pain and discomfort   If directed, put ice to the painful area. To do this: ? Put ice in a plastic bag. ? Place a towel between your skin and the bag. ? Leave the ice on for 20 minutes, 2-3 times a day.  Wear a supportive bra, especially during exercise. You may also want to wear a supportive bra while sleeping if your breasts are very tender. Medicines  Take over-the-counter and prescription medicines only as told by your health care provider. If the cause of your pain is infection, you may be prescribed an antibiotic medicine.  If you were prescribed an antibiotic, take it as told by your health care provider. Do not stop taking the antibiotic even if you start to feel better. Eating and drinking  Your health care provider may recommend that you lessen the amount of fat in your diet. You can do this by: ? Limiting fried foods. ? Cooking foods using methods such as baking, boiling, grilling, and broiling.  Decrease the amount of caffeine in your diet. Instead, drink more water and choose caffeine-free drinks. General instructions   Keep a log of the days and times when your breasts are most tender.  Ask your health care provider how to do breast exams at home. This will help you notice if you have an unusual growth or  lump.  Keep all follow-up visits as told by your health care provider. This is important. Contact a health care provider if:  Any part of your breast is hard, red, and hot to the touch. This may be a sign of infection.  You are a woman and: ? Not breastfeeding and you have fluid, especially blood or pus, coming out of your nipples. ? Have a new or painful lump in your breast that remains after your menstrual period ends.  You have a fever.  Your pain does not improve or it gets worse.  Your pain is interfering with your daily activities. Summary  Breast tenderness may range from mild discomfort to severe pain.  Breast tenderness has many possible causes, including hormone changes, infections, and taking certain medicines.  It can be treated with ice, wearing a supportive bra, and medicines.  Make changes to your diet if told to by your health care provider. This information is not intended to replace advice given to you by your health care provider. Make sure you discuss any questions you have with your health care provider. Document Revised: 09/13/2018 Document Reviewed: 09/13/2018 Elsevier Patient Education  [place].
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
## 2019-08-19 NOTE — Addendum Note (Signed)
Addended by: Jodey Burbano, Clarise Cruz E on: 08/19/2019 05:14 PM   Modules accepted: Orders

## 2019-08-23 ENCOUNTER — Other Ambulatory Visit: Payer: Self-pay | Admitting: Sports Medicine

## 2019-08-23 ENCOUNTER — Ambulatory Visit (INDEPENDENT_AMBULATORY_CARE_PROVIDER_SITE_OTHER): Payer: BC Managed Care – PPO | Admitting: Sports Medicine

## 2019-08-23 ENCOUNTER — Encounter: Payer: Self-pay | Admitting: Sports Medicine

## 2019-08-23 ENCOUNTER — Other Ambulatory Visit: Payer: Self-pay

## 2019-08-23 DIAGNOSIS — D2372 Other benign neoplasm of skin of left lower limb, including hip: Secondary | ICD-10-CM | POA: Diagnosis not present

## 2019-08-23 DIAGNOSIS — D2371 Other benign neoplasm of skin of right lower limb, including hip: Secondary | ICD-10-CM | POA: Diagnosis not present

## 2019-08-23 DIAGNOSIS — L989 Disorder of the skin and subcutaneous tissue, unspecified: Secondary | ICD-10-CM | POA: Diagnosis not present

## 2019-08-23 DIAGNOSIS — D239 Other benign neoplasm of skin, unspecified: Secondary | ICD-10-CM | POA: Insufficient documentation

## 2019-08-23 MED ORDER — TRAMADOL HCL 50 MG PO TABS: 50.0000 mg | ORAL_TABLET | Freq: Three times a day (TID) | ORAL | 0 refills | Status: DC | PRN

## 2019-08-23 NOTE — Assessment & Plan Note (Addendum)
Today I evaluated a right lower leg suspicious skin lesion and determined that a surgical excision of her right lower leg skin lesion was necessary, this was performed with multilayer closure. Hydrocodone for postoperative pain, return to see me in 2 weeks for incision check. Skin lesion will be sent off for Derm path.  Skin lesion was a benign dermatofibroma with clear margins.

## 2019-08-23 NOTE — Patient Instructions (Signed)
Incision Care, Adult An incision is a surgical cut that is made through your skin. Most incisions are closed after surgery. Your incision may be closed with stitches (sutures), staples, skin glue, or adhesive strips. You may need to return to your health care provider to have sutures or staples removed. This may occur several days to several weeks after your surgery. The incision needs to be cared for properly to prevent infection. How to care for your incision Incision care   Follow instructions from your health care provider about how to take care of your incision. Make sure you: ? Wash your hands with soap and water before you change the bandage (dressing). If soap and water are not available, use hand sanitizer. ? Change your dressing as told by your health care provider. ? Leave sutures, skin glue, or adhesive strips in place. These skin closures may need to stay in place for 2 weeks or longer. If adhesive strip edges start to loosen and curl up, you may trim the loose edges. Do not remove adhesive strips completely unless your health care provider tells you to do that.  Check your incision area every day for signs of infection. Check for: ? More redness, swelling, or pain. ? More fluid or blood. ? Warmth. ? Pus or a bad smell.  Ask your health care provider how to clean the incision. This may include: ? Using mild soap and water. ? Using a clean towel to pat the incision dry after cleaning it. ? Applying a cream or ointment. Do this only as told by your health care provider. ? Covering the incision with a clean dressing.  Ask your health care provider when you can leave the incision uncovered.  Do not take baths, swim, or use a hot tub until your health care provider approves. Ask your health care provider if you can take showers. You may only be allowed to take sponge baths for bathing. Medicines  If you were prescribed an antibiotic medicine, cream, or ointment, take or apply the  antibiotic as told by your health care provider. Do not stop taking or applying the antibiotic even if your condition improves.  Take over-the-counter and prescription medicines only as told by your health care provider. General instructions  Limit movement around your incision to improve healing. ? Avoid straining, lifting, or exercise for the first month, or for as long as told by your health care provider. ? Follow instructions from your health care provider about returning to your normal activities. ? Ask your health care provider what activities are safe.  Protect your incision from the sun when you are outside for the first 6 months, or for as long as told by your health care provider. Apply sunscreen around the scar or cover it up.  Keep all follow-up visits as told by your health care provider. This is important. Contact a health care provider if:  Your have more redness, swelling, or pain around the incision.  You have more fluid or blood coming from the incision.  Your incision feels warm to the touch.  You have pus or a bad smell coming from the incision.  You have a fever or shaking chills.  You are nauseous or you vomit.  You are dizzy.  Your sutures or staples come undone. Get help right away if:  You have a red streak coming from your incision.  Your incision bleeds through the dressing and the bleeding does not stop with gentle pressure.  The edges of  your incision open up and separate.  You have severe pain.  You have a rash.  You are confused.  You faint.  You have trouble breathing and a fast heartbeat. This information is not intended to replace advice given to you by your health care provider. Make sure you discuss any questions you have with your health care provider. Document Revised: 04/23/2018 Document Reviewed: 11/07/2015 Elsevier Patient Education  [place].

## 2019-08-23 NOTE — Progress Notes (Addendum)
    Procedures performed today:    Procedure:  Excision of 2.6 cm right lower extremity skin lesion Risks, benefits, and alternatives explained and consent obtained. Time out conducted. Surface prepped with alcohol. 10cc lidocaine with epinephine infiltrated in a field block. Adequate anesthesia ensured. Area prepped and draped in a sterile fashion. Excision performed with: I made longitudinal elliptical incision around the lesion carrying my dissection down into the subcutaneous tissues, we sharp and blunt dissection and removed the lesion as a whole, the 6 o'clock position was tagged with a suture, I then closed the incision with 3 deep dermal simple interrupted 3-0 Vicryl sutures to remove tension across the top of the wound, I then closed the skin with a running subcuticular 3-0 Vicryl suture Hemostasis achieved. Pt stable.  Independent interpretation of notes and tests performed by another provider:   None.  Brief History, Exam, Impression, and Recommendations:    Dermatofibroma of left lower extremity Today I evaluated a right lower leg suspicious skin lesion and determined that a surgical excision of her right lower leg skin lesion was necessary, this was performed with multilayer closure. Hydrocodone for postoperative pain, return to see me in 2 weeks for incision check. Skin lesion will be sent off for Derm path.  Skin lesion was a benign dermatofibroma with clear margins.    ___________________________________________ Gwen Her. Dianah Field, M.D., ABFM., CAQSM. Primary Care and [place] Instructor of [place] [facility] of Medicine

## 2019-09-07 ENCOUNTER — Other Ambulatory Visit: Payer: Self-pay

## 2019-09-07 ENCOUNTER — Ambulatory Visit (INDEPENDENT_AMBULATORY_CARE_PROVIDER_SITE_OTHER): Payer: BC Managed Care – PPO | Admitting: Sports Medicine

## 2019-09-07 DIAGNOSIS — D2372 Other benign neoplasm of skin of left lower limb, including hip: Secondary | ICD-10-CM

## 2019-09-07 NOTE — Assessment & Plan Note (Signed)
Kathryn Cline returns, she is approximately 2 weeks post surgical excision of a right lower extremity dermatofibroma, we performed a multilayer closure with subcuticular stitches, everything looks fabulous. Return as needed.

## 2019-09-07 NOTE — Progress Notes (Signed)
    Procedures performed today:    None.  Independent interpretation of notes and tests performed by another provider:   None.  Brief History, Exam, Impression, and Recommendations:    Dermatofibroma of left lower extremity Kathryn Cline returns, she is approximately 2 weeks post surgical excision of a right lower extremity dermatofibroma, we performed a multilayer closure with subcuticular stitches, everything looks fabulous. Return as needed.    ___________________________________________ Gwen Her. Dianah Field, M.D., ABFM., CAQSM. Primary Care and [place] Instructor of [place] [facility] of Medicine

## 2019-11-03 ENCOUNTER — Other Ambulatory Visit: Payer: Self-pay

## 2019-11-03 ENCOUNTER — Ambulatory Visit (INDEPENDENT_AMBULATORY_CARE_PROVIDER_SITE_OTHER): Payer: BLUE CROSS/BLUE SHIELD | Admitting: Nurse Practitioner

## 2019-11-03 ENCOUNTER — Encounter: Payer: Self-pay | Admitting: Nurse Practitioner

## 2019-11-03 VITALS — BP 155/91 | HR 93 | Temp 98.1°F | Ht 68.0 in | Wt 294.1 lb

## 2019-11-03 DIAGNOSIS — G500 Trigeminal neuralgia: Secondary | ICD-10-CM

## 2019-11-03 DIAGNOSIS — M5481 Occipital neuralgia: Secondary | ICD-10-CM

## 2019-11-03 DIAGNOSIS — G43511 Persistent migraine aura without cerebral infarction, intractable, with status migrainosus: Secondary | ICD-10-CM

## 2019-11-03 DIAGNOSIS — R221 Localized swelling, mass and lump, neck: Secondary | ICD-10-CM | POA: Diagnosis not present

## 2019-11-03 MED ORDER — KETOROLAC TROMETHAMINE 60 MG/2ML IM SOLN
60.0000 mg | Freq: Once | INTRAMUSCULAR | Status: AC
  Administered 2019-11-03: 60 mg via INTRAMUSCULAR

## 2019-11-03 MED ORDER — SUMATRIPTAN SUCCINATE 6 MG/0.5ML ~~LOC~~ SOLN
6.0000 mg | Freq: Once | SUBCUTANEOUS | Status: AC
  Administered 2019-11-03: 6 mg via SUBCUTANEOUS

## 2019-11-03 MED ORDER — SUMATRIPTAN SUCCINATE 100 MG PO TABS: 100.0000 mg | ORAL_TABLET | ORAL | 2 refills | Status: DC | PRN

## 2019-11-03 MED ORDER — DEXAMETHASONE SODIUM PHOSPHATE 10 MG/ML IJ SOLN
10.0000 mg | Freq: Once | INTRAMUSCULAR | Status: AC
  Administered 2019-11-03: 10 mg via INTRAMUSCULAR

## 2019-11-03 MED ORDER — PROMETHAZINE HCL 25 MG/ML IJ SOLN
25.0000 mg | Freq: Once | INTRAMUSCULAR | Status: AC
  Administered 2019-11-03: 25 mg via INTRAMUSCULAR
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
## 2019-11-03 NOTE — Patient Instructions (Addendum)
They will call you to schedule the MRI as soon as we get approval from insurance.     Occipital Neuralgia  Occipital neuralgia is a type of headache that causes brief episodes of very bad pain in the back of your head. Pain from occipital neuralgia may spread (radiate) to other parts of your head. These headaches may be caused by irritation of the nerves that leave your spinal cord high up in your neck, just below the base of your skull (occipital nerves). Your occipital nerves transmit sensations from the back of your head, the top of your head, and the areas behind your ears. What are the causes? This condition can occur without any known cause (primary headache syndrome). In other cases, this condition is caused by pressure on or irritation of one of the two occipital nerves. Pressure and irritation may be due to:  Muscle spasm in the neck.  Neck injury.  Wear and tear of the vertebrae in the neck (osteoarthritis).  Disease of the disks that separate the vertebrae.  Swollen blood vessels that put pressure on the occipital nerves.  Infections.  Tumors.  Diabetes. What are the signs or symptoms? This condition causes brief burning, stabbing, electric, shocking, or shooting pain which can radiate to the top of the head. It can happen on one side or both sides of the head. It can also cause:  Pain behind the eye.  Pain triggered by neck movement or hair brushing.  Scalp tenderness.  Aching in the back of the head between episodes of very bad pain.  Pain gets worse with exposure to bright lights. How is this diagnosed? There is no test that diagnoses this condition. Your health care provider may diagnose this condition based on a physical exam and your symptoms. Other tests may be done, such as:  Imaging studies of the brain and neck (cervical spine), such as an MRI or CT scan. These look for causes of pinched nerves.  Applying pressure to the nerves in the neck to try to  re-create the pain.  Injection of numbing medicine into the occipital nerve areas to see if pain goes away (diagnostic nerve block). How is this treated? Treatment for this condition may begin with simple measures, such as:  Rest.  Massage.  Applying heat or cold on the area.  Over-the-counter pain relievers. If these measures do not work, you may need other treatments, including:  Medicines, such as: ? Prescription-strength anti-inflammatory medicines. ? Muscle relaxants. ? Anti-seizure medicines, which can relieve pain. ? Antidepressants, which can relieve pain. ? Injected medicines, such as medicines that numb the area (local anesthetic) and steroids.  Pulsed radiofrequency ablation. This is when wires are implanted to deliver electrical impulses that block pain signals from the occipital nerve.  Surgery to relieve nerve pressure.  Physical therapy. Follow these instructions at home: Pain management      Avoid any activities that cause pain.  Rest when you have an attack of pain.  Try gentle massage to relieve pain.  Try a different pillow or sleeping position.  If directed, apply heat to the affected area as told by your health care provider. Use the heat source that your health care provider recommends, such as a moist heat pack or a heating pad. ? Place a towel between your skin and the heat source. ? Leave the heat on for 20-30 minutes. ? Remove the heat if your skin turns bright red. This is especially important if you are unable to feel  pain, heat, or cold. You may have a greater risk of getting burned.  If directed, apply ice to the back of the head and neck area as told by your health care provider. ? Put ice in a plastic bag. ? Place a towel between your skin and the bag. ? Leave the ice on for 20 minutes, 2-3 times per day. General instructions  Take over-the-counter and prescription medicines only as told by your health care provider.  Avoid things  that make your symptoms worse, such as bright lights.  Try to stay active. Get regular exercise that does not cause pain. Ask your health care provider to suggest safe exercises for you.  Work with a physical therapist to learn stretching exercises you can do at home.  Practice good posture.  Keep all follow-up visits as told by your health care provider. This is important. Contact a health care provider if:  Your medicine is not working.  You have new or worsening symptoms. Get help right away if:  You have very bad head pain that does not go away.  You have a sudden change in vision, balance, or speech. Summary  Occipital neuralgia is a type of headache that causes brief episodes of very bad pain in the back of your head.  Pain from occipital neuralgia may spread (radiate) to other parts of your head.  Treatment for this condition includes rest, massage, and medicines. This information is not intended to replace advice given to you by your health care provider. Make sure you discuss any questions you have with your health care provider. Document Revised: 04/07/2017 Document Reviewed: 06/26/2016 Elsevier Patient Education  [place].   Sumatriptan tablets What is this medicine? SUMATRIPTAN (soo ma TRIP tan) is used to treat migraines with or without aura. An aura is a strange feeling or visual disturbance that warns you of an attack. It is not used to prevent migraines. This medicine may be used for other purposes; ask your health care provider or pharmacist if you have questions. COMMON BRAND NAME(S): Imitrex, Migraine Pack What should I tell my health care provider before I take this medicine? They need to know if you have any of these conditions:  cigarette smoker  circulation problems in fingers and toes  diabetes  heart disease  high blood pressure  high cholesterol  history of irregular heartbeat  history of stroke  kidney disease  liver  disease  stomach or intestine problems  an unusual or allergic reaction to sumatriptan, other medicines, foods, dyes, or preservatives  pregnant or trying to get pregnant  breast-feeding How should I use this medicine? Take this medicine by mouth with a glass of water. Follow the directions on the prescription label. Do not take it more often than directed. Talk to your pediatrician regarding the use of this medicine in children. Special care may be needed. Overdosage: If you think you have taken too much of this medicine contact a poison control center or emergency room at once. NOTE: This medicine is only for you. Do not share this medicine with others. What if I miss a dose? This does not apply. This medicine is not for regular use. What may interact with this medicine? Do not take this medicine with any of the following medicines:  certain medicines for migraine headache like almotriptan, eletriptan, frovatriptan, naratriptan, rizatriptan, sumatriptan, zolmitriptan  ergot alkaloids like dihydroergotamine, ergonovine, ergotamine, methylergonovine  MAOIs like Carbex, Eldepryl, Marplan, Nardil, and Parnate This medicine may also interact with  the following medications:  certain medicines for depression, anxiety, or psychotic disorders This list may not describe all possible interactions. Give your health care provider a list of all the medicines, herbs, non-prescription drugs, or dietary supplements you use. Also tell them if you smoke, drink alcohol, or use illegal drugs. Some items may interact with your medicine. What should I watch for while using this medicine? Visit your healthcare professional for regular checks on your progress. Tell your healthcare professional if your symptoms do not start to get better or if they get worse. You may get drowsy or dizzy. Do not drive, use machinery, or do anything that needs mental alertness until you know how this medicine affects you. Do not  stand up or sit up quickly, especially if you are an older patient. This reduces the risk of dizzy or fainting spells. Alcohol may interfere with the effect of this medicine. Tell your healthcare professional right away if you have any change in your eyesight. If you take migraine medicines for 10 or more days a month, your migraines may get worse. Keep a diary of headache days and medicine use. Contact your healthcare professional if your migraine attacks occur more frequently. What side effects may I notice from receiving this medicine? Side effects that you should report to your doctor or health care professional as soon as possible:  allergic reactions like skin rash, itching or hives, swelling of the face, lips, or tongue  changes in vision  chest pain or chest tightness  signs and symptoms of a dangerous change in heartbeat or heart rhythm like chest pain; dizziness; fast, irregular heartbeat; palpitations; feeling faint or lightheaded; falls; breathing problems  signs and symptoms of a stroke like changes in vision; confusion; trouble speaking or understanding; severe headaches; sudden numbness or weakness of the face, arm or leg; trouble walking; dizziness; loss of balance or coordination  signs and symptoms of serotonin syndrome like irritable; confusion; diarrhea; fast or irregular heartbeat; muscle twitching; stiff muscles; trouble walking; sweating; high fever; seizures; chills; vomiting Side effects that usually do not require medical attention (report to your doctor or health care professional if they continue or are bothersome):  diarrhea  dizziness  drowsiness  dry mouth  headache  nausea, vomiting  pain, tingling, numbness in the hands or feet  stomach pain This list may not describe all possible side effects. Call your doctor for medical advice about side effects. You may report side effects to FDA at 1-800-FDA-1088. Where should I keep my medicine? Keep out of  the reach of children. Store at room temperature between 2 and 30 degrees C (36 and 86 degrees F). Throw away any unused medicine after the expiration date. NOTE: This sheet is a summary. It may not cover all possible information. If you have questions about this medicine, talk to your doctor, pharmacist, or health care provider.  2020 Elsevier/Gold Standard (2017-11-03 15:05:37)

## 2019-11-04 ENCOUNTER — Telehealth: Payer: Self-pay | Admitting: Nurse Practitioner

## 2019-11-04 ENCOUNTER — Other Ambulatory Visit: Payer: Self-pay | Admitting: Physician Assistant

## 2019-11-04 DIAGNOSIS — R600 Localized edema: Secondary | ICD-10-CM

## 2019-11-04 NOTE — Telephone Encounter (Signed)
Sarah   [name] from [facility] called stating that they will not be able to treat patient for mass of neck with possible nerve involvement per their triage nurses.   Jenny Reichmann

## 2019-11-08 NOTE — Telephone Encounter (Signed)
Thank you. I will await the results from her MRI to see if there is nerve involvement and then we can address whether this can be a general surgery evaluation or if she needs to go to neurology.

## 2019-12-06 ENCOUNTER — Encounter: Payer: Self-pay | Admitting: Family Medicine

## 2019-12-06 ENCOUNTER — Ambulatory Visit (INDEPENDENT_AMBULATORY_CARE_PROVIDER_SITE_OTHER): Payer: BLUE CROSS/BLUE SHIELD | Admitting: Family Medicine

## 2019-12-06 ENCOUNTER — Other Ambulatory Visit: Payer: Self-pay

## 2019-12-06 DIAGNOSIS — R5382 Chronic fatigue, unspecified: Secondary | ICD-10-CM | POA: Diagnosis not present

## 2019-12-06 DIAGNOSIS — J0100 Acute maxillary sinusitis, unspecified: Secondary | ICD-10-CM | POA: Diagnosis not present

## 2019-12-06 DIAGNOSIS — R5383 Other fatigue: Secondary | ICD-10-CM | POA: Insufficient documentation

## 2019-12-06 MED ORDER — AMOXICILLIN-POT CLAVULANATE 875-125 MG PO TABS
1.0000 | ORAL_TABLET | Freq: Two times a day (BID) | ORAL | 0 refills | Status: DC
Start: 2019-12-06 — End: 2019-12-09

## 2019-12-06 NOTE — Assessment & Plan Note (Signed)
Start augmentin.  Recommend continued supportive care with increased fluids and rest.   Call if symptoms are not improving.

## 2019-12-06 NOTE — Patient Instructions (Addendum)
I would recommend following up with Kathryn Cline to discuss your fatigue and follow up for your elevated blood pressure.      Sinusitis, Adult Sinusitis is soreness and swelling (inflammation) of your sinuses. Sinuses are hollow spaces in the bones around your face. They are located:  Around your eyes.  In the middle of your forehead.  Behind your nose.  In your cheekbones. Your sinuses and nasal passages are lined with a fluid called mucus. Mucus drains out of your sinuses. Swelling can trap mucus in your sinuses. This lets germs (bacteria, virus, or fungus) grow, which leads to infection. Most of the time, this condition is caused by a virus. What are the causes? This condition is caused by:  Allergies.  Asthma.  Germs.  Things that block your nose or sinuses.  Growths in the nose (nasal polyps).  Chemicals or irritants in the air.  Fungus (rare). What increases the risk? You are more likely to develop this condition if:  You have a weak body defense system (immune system).  You do a lot of swimming or diving.  You use nasal sprays too much.  You smoke. What are the signs or symptoms? The main symptoms of this condition are pain and a feeling of pressure around the sinuses. Other symptoms include:  Stuffy nose (congestion).  Runny nose (drainage).  Swelling and warmth in the sinuses.  Headache.  Toothache.  A cough that may get worse at night.  Mucus that collects in the throat or the back of the nose (postnasal drip).  Being unable to smell and taste.  Being very tired (fatigue).  A fever.  Sore throat.  Bad breath. How is this diagnosed? This condition is diagnosed based on:  Your symptoms.  Your medical history.  A physical exam.  Tests to find out if your condition is short-term (acute) or long-term (chronic). Your doctor may: ? Check your nose for growths (polyps). ? Check your sinuses using a tool that has a light (endoscope). ? Check  for allergies or germs. ? Do imaging tests, such as an MRI or CT scan. How is this treated? Treatment for this condition depends on the cause and whether it is short-term or long-term.  If caused by a virus, your symptoms should go away on their own within 10 days. You may be given medicines to relieve symptoms. They include: ? Medicines that shrink swollen tissue in the nose. ? Medicines that treat allergies (antihistamines). ? A spray that treats swelling of the nostrils. ? Rinses that help get rid of thick mucus in your nose (nasal saline washes).  If caused by bacteria, your doctor may wait to see if you will get better without treatment. You may be given antibiotic medicine if you have: ? A very bad infection. ? A weak body defense system.  If caused by growths in the nose, you may need to have surgery. Follow these instructions at home: Medicines  Take, use, or apply over-the-counter and prescription medicines only as told by your doctor. These may include nasal sprays.  If you were prescribed an antibiotic medicine, take it as told by your doctor. Do not stop taking the antibiotic even if you start to feel better. Hydrate and humidify   Drink enough water to keep your pee (urine) pale yellow.  Use a cool mist humidifier to keep the humidity level in your home above 50%.  Breathe in steam for 10-15 minutes, 3-4 times a day, or as told by your doctor.  You can do this in the bathroom while a hot shower is running.  Try not to spend time in cool or dry air. Rest  Rest as much as you can.  Sleep with your head raised (elevated).  Make sure you get enough sleep each night. General instructions   Put a warm, moist washcloth on your face 3-4 times a day, or as often as told by your doctor. This will help with discomfort.  Wash your hands often with soap and water. If there is no soap and water, use hand sanitizer.  Do not smoke. Avoid being around people who are smoking  (secondhand smoke).  Keep all follow-up visits as told by your doctor. This is important. Contact a doctor if:  You have a fever.  Your symptoms get worse.  Your symptoms do not get better within 10 days. Get help right away if:  You have a very bad headache.  You cannot stop throwing up (vomiting).  You have very bad pain or swelling around your face or eyes.  You have trouble seeing.  You feel confused.  Your neck is stiff.  You have trouble breathing. Summary  Sinusitis is swelling of your sinuses. Sinuses are hollow spaces in the bones around your face.  This condition is caused by tissues in your nose that become inflamed or swollen. This traps germs. These can lead to infection.  If you were prescribed an antibiotic medicine, take it as told by your doctor. Do not stop taking it even if you start to feel better.  Keep all follow-up visits as told by your doctor. This is important. This information is not intended to replace advice given to you by your health care provider. Make sure you discuss any questions you have with your health care provider. Document Revised: 09/21/2017 Document Reviewed: 09/21/2017 Elsevier Patient Education  [place].
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
## 2019-12-06 NOTE — Assessment & Plan Note (Signed)
Encouraged to f/u with PCP to discuss.

## 2019-12-09 ENCOUNTER — Other Ambulatory Visit: Payer: Self-pay

## 2019-12-09 ENCOUNTER — Ambulatory Visit (INDEPENDENT_AMBULATORY_CARE_PROVIDER_SITE_OTHER): Payer: BLUE CROSS/BLUE SHIELD | Admitting: Physician Assistant

## 2019-12-09 VITALS — BP 148/80 | HR 94 | Ht 68.0 in | Wt 301.0 lb

## 2019-12-09 DIAGNOSIS — Z6379 Other stressful life events affecting family and household: Secondary | ICD-10-CM

## 2019-12-09 DIAGNOSIS — G8929 Other chronic pain: Secondary | ICD-10-CM

## 2019-12-09 DIAGNOSIS — K219 Gastro-esophageal reflux disease without esophagitis: Secondary | ICD-10-CM | POA: Diagnosis not present

## 2019-12-09 DIAGNOSIS — R600 Localized edema: Secondary | ICD-10-CM

## 2019-12-09 DIAGNOSIS — M545 Low back pain, unspecified: Secondary | ICD-10-CM

## 2019-12-09 DIAGNOSIS — Z1231 Encounter for screening mammogram for malignant neoplasm of breast: Secondary | ICD-10-CM

## 2019-12-09 DIAGNOSIS — I10 Essential (primary) hypertension: Secondary | ICD-10-CM | POA: Diagnosis not present

## 2019-12-09 DIAGNOSIS — R5382 Chronic fatigue, unspecified: Secondary | ICD-10-CM

## 2019-12-09 DIAGNOSIS — F419 Anxiety disorder, unspecified: Secondary | ICD-10-CM

## 2019-12-09 DIAGNOSIS — F331 Major depressive disorder, recurrent, moderate: Secondary | ICD-10-CM

## 2019-12-09 MED ORDER — LORAZEPAM 0.5 MG PO TABS: 0.5000 mg | ORAL_TABLET | Freq: Three times a day (TID) | ORAL | 1 refills | Status: DC | PRN

## 2019-12-09 MED ORDER — FUROSEMIDE 40 MG PO TABS: ORAL_TABLET | ORAL | 1 refills | Status: DC

## 2019-12-09 MED ORDER — DULOXETINE HCL 60 MG PO CPEP: 60.0000 mg | ORAL_CAPSULE | Freq: Every day | ORAL | 1 refills | Status: DC

## 2019-12-09 MED ORDER — PANTOPRAZOLE SODIUM 40 MG PO TBEC: 40.0000 mg | DELAYED_RELEASE_TABLET | Freq: Two times a day (BID) | ORAL | 1 refills | Status: DC

## 2019-12-09 MED ORDER — TRAMADOL HCL 50 MG PO TABS: 50.0000 mg | ORAL_TABLET | Freq: Three times a day (TID) | ORAL | 0 refills | Status: DC | PRN

## 2019-12-09 MED ORDER — LAMOTRIGINE 200 MG PO TABS: 200.0000 mg | ORAL_TABLET | Freq: Every day | ORAL | 1 refills | Status: DC

## 2019-12-09 MED ORDER — ATENOLOL 50 MG PO TABS: 50.0000 mg | ORAL_TABLET | Freq: Every day | ORAL | 1 refills | Status: DC

## 2019-12-09 MED ORDER — FLUOXETINE HCL 20 MG PO CAPS: 20.0000 mg | ORAL_CAPSULE | Freq: Every day | ORAL | 1 refills | Status: DC
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
## 2019-12-12 ENCOUNTER — Telehealth: Payer: Self-pay | Admitting: Neurology

## 2019-12-12 NOTE — Telephone Encounter (Signed)
Patient left vm stating in order to have her MR under general anesthesia we need to send an H&P to scheduling at [phone] attn: Tasha. Their number is [phone]. This is at [facility]. Patient's note not complete from 12/09/2019. Order is from [employer]. Please advise.

## 2019-12-13 ENCOUNTER — Encounter: Payer: Self-pay | Admitting: Physician Assistant

## 2019-12-13 DIAGNOSIS — Z6379 Other stressful life events affecting family and household: Secondary | ICD-10-CM | POA: Insufficient documentation

## 2019-12-13 DIAGNOSIS — K219 Gastro-esophageal reflux disease without esophagitis: Secondary | ICD-10-CM | POA: Insufficient documentation

## 2019-12-13 NOTE — Telephone Encounter (Signed)
JJ- are you saying that Jade's note from 8/6 is incomplete? Can we use my note when I ordered the MR?

## 2019-12-13 NOTE — Telephone Encounter (Signed)
H&P needs to be within 30 days of MR per radiology. Sent both notes, Jomarie Longs may be out of range, do not have to discuss reason for the scan just needs to be H&P to make sure okay to go under general. I did sent both notes to scheduling so radiology can review. Tried to call patient to make her aware. No answer. VM full.

## 2019-12-13 NOTE — Telephone Encounter (Signed)
Note done from 8/6. But I did not address anything at my appt about the MR. You should send Kathryn Cline note.

## 2019-12-23 ENCOUNTER — Other Ambulatory Visit (HOSPITAL_COMMUNITY)
Admission: RE | Admit: 2019-12-23 | Discharge: 2019-12-23 | Disposition: A | Discharge status: Home / Self Care | Payer: BLUE CROSS/BLUE SHIELD | Source: Ambulatory Visit | Attending: Nurse Practitioner | Admitting: Nurse Practitioner

## 2019-12-23 DIAGNOSIS — Z20822 Contact with and (suspected) exposure to covid-19: Secondary | ICD-10-CM | POA: Insufficient documentation

## 2019-12-23 DIAGNOSIS — Z01812 Encounter for preprocedural laboratory examination: Secondary | ICD-10-CM | POA: Insufficient documentation

## 2019-12-23 LAB — SARS CORONAVIRUS 2 (TAT 6-24 HRS): SARS Coronavirus 2: NEGATIVE

## 2019-12-24 ENCOUNTER — Ambulatory Visit (HOSPITAL_COMMUNITY): Payer: BLUE CROSS/BLUE SHIELD

## 2019-12-25 ENCOUNTER — Encounter (HOSPITAL_COMMUNITY): Payer: Self-pay | Admitting: *Deleted

## 2019-12-25 NOTE — Progress Notes (Signed)
Patient denies shortness of breath, fever, cough or chest pain.  PCP - [name], PA-C Cardiologist - n/a  Chest x-ray - n/a EKG - 12/27/19 DOS Stress Test - n/a ECHO - n/a Cardiac Cath - n/a  Anesthesia review: Yes  STOP now taking any Aspirin (unless otherwise instructed by your surgeon), Aleve, Naproxen, Ibuprofen, Motrin, Advil, Goody's, BC's, all herbal medications, fish oil, and all vitamins.   Coronavirus Screening Covid test on 12/23/19 was negative.  Patient verbalized understanding of instructions that were given via phone.

## 2019-12-26 ENCOUNTER — Encounter (HOSPITAL_COMMUNITY): Payer: Self-pay | Admitting: *Deleted
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
## 2019-12-27 ENCOUNTER — Ambulatory Visit (HOSPITAL_COMMUNITY)
Admission: RE | Admit: 2019-12-27 | Discharge: 2019-12-27 | Disposition: A | Discharge status: Home / Self Care | Payer: BLUE CROSS/BLUE SHIELD | Source: Ambulatory Visit | Attending: Nurse Practitioner | Admitting: Nurse Practitioner

## 2019-12-27 ENCOUNTER — Encounter (HOSPITAL_COMMUNITY): Admission: RE | Disposition: A | Discharge status: Home / Self Care | Payer: Self-pay | Source: Home / Self Care

## 2019-12-27 ENCOUNTER — Ambulatory Visit (HOSPITAL_COMMUNITY): Payer: BLUE CROSS/BLUE SHIELD | Admitting: Anesthesiology

## 2019-12-27 ENCOUNTER — Encounter (HOSPITAL_COMMUNITY): Payer: Self-pay

## 2019-12-27 ENCOUNTER — Other Ambulatory Visit: Payer: Self-pay

## 2019-12-27 ENCOUNTER — Ambulatory Visit (HOSPITAL_COMMUNITY)
Admission: RE | Admit: 2019-12-27 | Discharge: 2019-12-27 | Disposition: A | Discharge status: Home / Self Care | Payer: BLUE CROSS/BLUE SHIELD | Attending: Nurse Practitioner | Admitting: Nurse Practitioner

## 2019-12-27 DIAGNOSIS — M5481 Occipital neuralgia: Secondary | ICD-10-CM | POA: Insufficient documentation

## 2019-12-27 DIAGNOSIS — M50322 Other cervical disc degeneration at C5-C6 level: Secondary | ICD-10-CM | POA: Diagnosis not present

## 2019-12-27 DIAGNOSIS — R221 Localized swelling, mass and lump, neck: Secondary | ICD-10-CM | POA: Diagnosis not present

## 2019-12-27 DIAGNOSIS — I10 Essential (primary) hypertension: Secondary | ICD-10-CM | POA: Insufficient documentation

## 2019-12-27 DIAGNOSIS — M4802 Spinal stenosis, cervical region: Secondary | ICD-10-CM | POA: Insufficient documentation

## 2019-12-27 DIAGNOSIS — Z79899 Other long term (current) drug therapy: Secondary | ICD-10-CM | POA: Insufficient documentation

## 2019-12-27 DIAGNOSIS — K118 Other diseases of salivary glands: Secondary | ICD-10-CM | POA: Diagnosis not present

## 2019-12-27 DIAGNOSIS — J45901 Unspecified asthma with (acute) exacerbation: Secondary | ICD-10-CM | POA: Diagnosis not present

## 2019-12-27 DIAGNOSIS — G500 Trigeminal neuralgia: Secondary | ICD-10-CM | POA: Insufficient documentation

## 2019-12-27 DIAGNOSIS — Z6841 Body Mass Index (BMI) 40.0 and over, adult: Secondary | ICD-10-CM | POA: Diagnosis not present

## 2019-12-27 DIAGNOSIS — J392 Other diseases of pharynx: Secondary | ICD-10-CM | POA: Diagnosis not present

## 2019-12-27 DIAGNOSIS — M19012 Primary osteoarthritis, left shoulder: Secondary | ICD-10-CM | POA: Insufficient documentation

## 2019-12-27 DIAGNOSIS — D170 Benign lipomatous neoplasm of skin and subcutaneous tissue of head, face and neck: Secondary | ICD-10-CM | POA: Diagnosis not present

## 2019-12-27 HISTORY — PX: RADIOLOGY WITH ANESTHESIA: SHX6223

## 2019-12-27 LAB — BASIC METABOLIC PANEL
Anion gap: 12 (ref 5–15)
BUN: 7 mg/dL (ref 6–20)
CO2: 25 mmol/L (ref 22–32)
Calcium: 9.3 mg/dL (ref 8.9–10.3)
Chloride: 101 mmol/L (ref 98–111)
Creatinine, Ser: 0.64 mg/dL (ref 0.44–1.00)
GFR calc Af Amer: >60 mL/min (ref >60)
GFR calc non Af Amer: >60 mL/min (ref >60)
Glucose, Bld: 108 mg/dL — ABNORMAL HIGH (ref 70–99)
Potassium: 4 mmol/L (ref 3.5–5.1)
Sodium: 138 mmol/L (ref 135–145)

## 2019-12-27 LAB — CBC
HCT: 37.8 % (ref 36.0–46.0)
Hemoglobin: 12.1 g/dL (ref 12.0–15.0)
MCH: 30.2 pg (ref 26.0–34.0)
MCHC: 32 g/dL (ref 30.0–36.0)
MCV: 94.3 fL (ref 80.0–100.0)
Platelets: 205 10*3/uL (ref 150–400)
RBC: 4.01 MIL/uL (ref 3.87–5.11)
RDW: 13.5 % (ref 11.5–15.5)
WBC: 6.8 10*3/uL (ref 4.0–10.5)
nRBC: 0 % (ref 0.0–0.2)

## 2019-12-27 SURGERY — MRI WITH ANESTHESIA
Anesthesia: General | Laterality: Left

## 2019-12-27 MED ORDER — DEXAMETHASONE SODIUM PHOSPHATE 10 MG/ML IJ SOLN
INTRAMUSCULAR | Status: DC | PRN
  Administered 2019-12-27: 10 mg via INTRAVENOUS

## 2019-12-27 MED ORDER — GADOBUTROL 1 MMOL/ML IV SOLN
10.0000 mL | Freq: Once | INTRAVENOUS | Status: AC | PRN
  Administered 2019-12-27: 10 mL via INTRAVENOUS

## 2019-12-27 MED ORDER — ONDANSETRON HCL 4 MG/2ML IJ SOLN: 4.0000 mg | Freq: Once | INTRAMUSCULAR | Status: DC | PRN

## 2019-12-27 MED ORDER — MIDAZOLAM HCL 2 MG/2ML IJ SOLN
INTRAMUSCULAR | Status: DC | PRN
  Administered 2019-12-27: 2 mg via INTRAVENOUS

## 2019-12-27 MED ORDER — ORAL CARE MOUTH RINSE: 15.0000 mL | Freq: Once | OROMUCOSAL | Status: AC

## 2019-12-27 MED ORDER — ROCURONIUM BROMIDE 10 MG/ML (PF) SYRINGE
PREFILLED_SYRINGE | INTRAVENOUS | Status: DC | PRN
  Administered 2019-12-27: 20 mg via INTRAVENOUS
  Administered 2019-12-27: 50 mg via INTRAVENOUS

## 2019-12-27 MED ORDER — FENTANYL CITRATE (PF) 100 MCG/2ML IJ SOLN: 25.0000 ug | INTRAMUSCULAR | Status: DC | PRN

## 2019-12-27 MED ORDER — ACETAMINOPHEN 160 MG/5ML PO SOLN: 325.0000 mg | ORAL | Status: DC | PRN

## 2019-12-27 MED ORDER — SUCCINYLCHOLINE CHLORIDE 200 MG/10ML IV SOSY
PREFILLED_SYRINGE | INTRAVENOUS | Status: DC | PRN
  Administered 2019-12-27: 200 mg via INTRAVENOUS

## 2019-12-27 MED ORDER — OXYCODONE HCL 5 MG PO TABS: 5.0000 mg | ORAL_TABLET | Freq: Once | ORAL | Status: DC | PRN

## 2019-12-27 MED ORDER — ACETAMINOPHEN 325 MG PO TABS: 325.0000 mg | ORAL_TABLET | ORAL | Status: DC | PRN

## 2019-12-27 MED ORDER — OXYCODONE HCL 5 MG/5ML PO SOLN: 5.0000 mg | Freq: Once | ORAL | Status: DC | PRN

## 2019-12-27 MED ORDER — LACTATED RINGERS IV SOLN: INTRAVENOUS | Status: DC

## 2019-12-27 MED ORDER — CHLORHEXIDINE GLUCONATE 0.12 % MT SOLN
15.0000 mL | Freq: Once | OROMUCOSAL | Status: AC
  Administered 2019-12-27: 15 mL via OROMUCOSAL
  Filled 2019-12-27: qty 15

## 2019-12-27 MED ORDER — FENTANYL CITRATE (PF) 250 MCG/5ML IJ SOLN
INTRAMUSCULAR | Status: DC | PRN
Start: 2019-12-27 — End: 2019-12-27
  Administered 2019-12-27: 100 ug via INTRAVENOUS
  Administered 2019-12-27: 50 ug via INTRAVENOUS

## 2019-12-27 MED ORDER — MEPERIDINE HCL 25 MG/ML IJ SOLN: 6.2500 mg | INTRAMUSCULAR | Status: DC | PRN

## 2019-12-27 MED ORDER — SUGAMMADEX SODIUM 200 MG/2ML IV SOLN
INTRAVENOUS | Status: DC | PRN
  Administered 2019-12-27: 300 mg via INTRAVENOUS

## 2019-12-27 MED ORDER — LIDOCAINE 2% (20 MG/ML) 5 ML SYRINGE
INTRAMUSCULAR | Status: DC | PRN
  Administered 2019-12-27: 100 mg via INTRAVENOUS

## 2019-12-27 MED ORDER — PROPOFOL 10 MG/ML IV BOLUS
INTRAVENOUS | Status: DC | PRN
  Administered 2019-12-27: 200 mg via INTRAVENOUS

## 2019-12-27 MED ORDER — ONDANSETRON HCL 4 MG/2ML IJ SOLN
INTRAMUSCULAR | Status: DC | PRN
  Administered 2019-12-27: 4 mg via INTRAVENOUS

## 2019-12-27 NOTE — Transfer of Care (Signed)
Immediate Anesthesia Transfer of Care Note  Patient: Kathryn Cline  Procedure(s) Performed: MRI WITH ANESTHESIA NECK WITH AND WITHOUT CONTRAST (Left )  Patient Location: PACU  Anesthesia Type:General  Level of Consciousness: drowsy  Airway & Oxygen Therapy: Patient Spontanous Breathing and Patient connected to face mask oxygen  Post-op Assessment: Report given to RN and Post -op Vital signs reviewed and stable  Post vital signs: Reviewed and stable  Last Vitals:  Vitals Value Taken Time  BP 142/81 12/27/19 1008  Temp    Pulse 105 12/27/19 1010  Resp 18 12/27/19 1010  SpO2 100 % 12/27/19 1010  Vitals shown include unvalidated device data.  Last Pain:  Vitals:   12/27/19 0701  TempSrc:   PainSc: 6       Patients Stated Pain Goal: 6 (95/32/02 3343)  Complications: No complications documented.

## 2019-12-27 NOTE — Anesthesia Postprocedure Evaluation (Signed)
Anesthesia Post Note  Patient: Kathryn Cline  Procedure(s) Performed: MRI WITH ANESTHESIA NECK WITH AND WITHOUT CONTRAST (Left )     Patient location during evaluation: PACU Anesthesia Type: General Level of consciousness: awake and alert Pain management: pain level controlled Vital Signs Assessment: post-procedure vital signs reviewed and stable Respiratory status: spontaneous breathing, nonlabored ventilation, respiratory function stable and patient connected to nasal cannula oxygen Cardiovascular status: blood pressure returned to baseline and stable Postop Assessment: no apparent nausea or vomiting Anesthetic complications: no   No complications documented.  Last Vitals:  Vitals:   12/27/19 1022 12/27/19 1037  BP: (!) 152/85 (!) 141/91  Pulse: (!) 104 (!) 103  Resp: 15 20  Temp:    SpO2: 96% 97%    Last Pain:  Vitals:   12/27/19 1037  TempSrc:   PainSc: Asleep                 Kalilah Barua

## 2019-12-27 NOTE — Anesthesia Procedure Notes (Signed)
Procedure Name: Intubation Date/Time: 12/27/2019 8:25 AM Performed by: Bryson Corona, CRNA Pre-anesthesia Checklist: Patient identified, Emergency Drugs available, Suction available and Patient being monitored Patient Re-evaluated:Patient Re-evaluated prior to induction Oxygen Delivery Method: Circle System Utilized Preoxygenation: Pre-oxygenation with 100% oxygen Induction Type: IV induction and Rapid sequence Laryngoscope Size: Glidescope and 4 Grade View: Grade I Tube type: Oral Tube size: 7.0 mm Number of attempts: 1 Airway Equipment and Method: Stylet and Oral airway Placement Confirmation: ETT inserted through vocal cords under direct vision,  positive ETCO2 and breath sounds checked- equal and bilateral Secured at: 22 cm Tube secured with: Tape Dental Injury: Teeth and Oropharynx as per pre-operative assessment  Difficulty Due To: Difficult Airway- due to reduced neck mobility Comments: Elective glidescope intubation due to extreme neck pain with extension.

## 2019-12-28 ENCOUNTER — Encounter (HOSPITAL_COMMUNITY): Payer: Self-pay | Admitting: Radiology

## 2019-12-30 NOTE — Progress Notes (Signed)
MRI results show the mass on the shoulder is a lipoma, which is a benign collection of fatty tissue. This is great news!  The MRI also showed some degeneration in the neck with some narrowing of the spine that is most likely the cause of the numbness and tingling in your arm and hand.   The good news is, the lump does not appear to be causing the numbness and tingling- this appears to be coming from the neck. We can have you follow-up with Dr. Darene Lamer for recommendations on how to proceed with the neck.   Unless you just want to have the lump removed, it is OK to leave it for now.

## 2020-01-10 ENCOUNTER — Ambulatory Visit (INDEPENDENT_AMBULATORY_CARE_PROVIDER_SITE_OTHER): Payer: BLUE CROSS/BLUE SHIELD | Admitting: Physician Assistant

## 2020-01-10 ENCOUNTER — Other Ambulatory Visit: Payer: Self-pay

## 2020-01-10 ENCOUNTER — Encounter: Payer: Self-pay | Admitting: Physician Assistant

## 2020-01-10 VITALS — BP 156/92 | HR 89 | Ht 68.0 in | Wt 310.0 lb

## 2020-01-10 DIAGNOSIS — M545 Low back pain, unspecified: Secondary | ICD-10-CM

## 2020-01-10 DIAGNOSIS — R600 Localized edema: Secondary | ICD-10-CM

## 2020-01-10 DIAGNOSIS — G44229 Chronic tension-type headache, not intractable: Secondary | ICD-10-CM | POA: Diagnosis not present

## 2020-01-10 DIAGNOSIS — R1310 Dysphagia, unspecified: Secondary | ICD-10-CM

## 2020-01-10 DIAGNOSIS — I10 Essential (primary) hypertension: Secondary | ICD-10-CM

## 2020-01-10 DIAGNOSIS — M4802 Spinal stenosis, cervical region: Secondary | ICD-10-CM

## 2020-01-10 DIAGNOSIS — G8929 Other chronic pain: Secondary | ICD-10-CM

## 2020-01-10 DIAGNOSIS — M5412 Radiculopathy, cervical region: Secondary | ICD-10-CM | POA: Insufficient documentation

## 2020-01-10 MED ORDER — TRAMADOL HCL 50 MG PO TABS: 50.0000 mg | ORAL_TABLET | Freq: Two times a day (BID) | ORAL | 0 refills | Status: DC | PRN

## 2020-01-10 MED ORDER — NURTEC 75 MG PO TBDP: 1.0000 | ORAL_TABLET | ORAL | 5 refills | Status: DC | PRN

## 2020-01-10 MED ORDER — CELECOXIB 200 MG PO CAPS: 200.0000 mg | ORAL_CAPSULE | Freq: Two times a day (BID) | ORAL | 2 refills | Status: DC

## 2020-01-10 MED ORDER — TOPIRAMATE 50 MG PO TABS: 50.0000 mg | ORAL_TABLET | Freq: Two times a day (BID) | ORAL | 2 refills | Status: DC

## 2020-01-10 MED ORDER — FUROSEMIDE 40 MG PO TABS: ORAL_TABLET | ORAL | 0 refills | Status: DC

## 2020-01-10 MED ORDER — ATENOLOL 50 MG PO TABS: 50.0000 mg | ORAL_TABLET | Freq: Every day | ORAL | 1 refills | Status: DC

## 2020-01-10 MED ORDER — LOSARTAN POTASSIUM-HCTZ 50-12.5 MG PO TABS: 1.0000 | ORAL_TABLET | Freq: Every day | ORAL | 0 refills | Status: DC

## 2020-01-10 NOTE — Patient Instructions (Addendum)
Will get PT ordered for cervical spinal stenosis.  Will refer to GI for EGD due to dysphagia.  Stop ibuprofen start celebrex.  Tramadol twice a day.  Restart atenolol and topamax.  Start hyzaar daily for blood pressure control.  Only use lasix as needed.
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
## 2020-01-16 ENCOUNTER — Other Ambulatory Visit: Payer: Self-pay

## 2020-01-16 ENCOUNTER — Ambulatory Visit (INDEPENDENT_AMBULATORY_CARE_PROVIDER_SITE_OTHER): Payer: BLUE CROSS/BLUE SHIELD | Admitting: Sports Medicine

## 2020-01-16 DIAGNOSIS — M5412 Radiculopathy, cervical region: Secondary | ICD-10-CM | POA: Diagnosis not present

## 2020-01-16 MED ORDER — PREDNISONE 50 MG PO TABS: ORAL_TABLET | ORAL | 0 refills | Status: DC

## 2020-01-16 NOTE — Progress Notes (Signed)
    Procedures performed today:    None.  Independent interpretation of notes and tests performed by another provider:   None.  Brief History, Exam, Impression, and Recommendations:    Kathryn Cline is a pleasant 44yo female who presents for follow up of MRI results demonstrating C6/C7 DDD and a subcutaneous lipoma. She reports pain in her neck and tingling in her hands in the C7 distibution. I believe that the DDD is causing most of her symptoms. We are going to refer to PT for neck therapy and will prescirbe a 5 day burst of prednisone. We discussed that we do not believe the lipoma is tha cause of her pain. She denied any discussion of breast reduction surgery. We are going to have her follow up in 4-6 weeks to reevaluate after starting PT. If no better we will discuss next steps including epidural injection.   Marcelino Duster, MS3   ___________________________________________ Gwen Her. Dianah Field, M.D., ABFM., CAQSM. Primary Care and [place] Instructor of [employer] [facility] of Medicine

## 2020-01-16 NOTE — Assessment & Plan Note (Signed)
This is a pleasant 44 year old female, she has a long history of neck pain with radiation down the left arm to the second and third fingers, she also has a lipoma on her neck she was concerned was causing her pain. An MRI was obtained, it does show a small lipoma in the left lower neck subcutaneous tissues however the dominant finding is C6-C7 DDD. I think this is the main cause of her symptoms, adding 5 days of prednisone as well as aggressive formal physical therapy. Return to see me in 4 to 6 weeks, if persistent discomfort we will consider epidural injection. I did advise her that I do not think the lipoma is symptomatic in this case we should really leave it alone. She also declines any discussion of reduction mammoplasty.

## 2020-01-17 ENCOUNTER — Telehealth: Payer: Self-pay

## 2020-01-17 MED ORDER — UBRELVY 100 MG PO TABS: 1.0000 | ORAL_TABLET | ORAL | 1 refills | Status: DC | PRN

## 2020-01-17 NOTE — Telephone Encounter (Signed)
Prior Authorization for Nurtec 75 mg Dispersible tablets. It did ask if patient has tried [place].

## 2020-01-17 NOTE — Telephone Encounter (Signed)
Left message advising of new medication.

## 2020-01-17 NOTE — Telephone Encounter (Signed)
PA was denied. They asked if the patient tried [place].

## 2020-01-17 NOTE — Telephone Encounter (Signed)
Ok sent ubrelvy to try as needed.

## 2020-01-23 ENCOUNTER — Encounter: Payer: Self-pay | Admitting: Physician Assistant

## 2020-02-02 ENCOUNTER — Telehealth (INDEPENDENT_AMBULATORY_CARE_PROVIDER_SITE_OTHER): Payer: BLUE CROSS/BLUE SHIELD | Admitting: Medical

## 2020-02-02 ENCOUNTER — Other Ambulatory Visit (HOSPITAL_COMMUNITY): Payer: Self-pay | Admitting: Medical

## 2020-02-02 ENCOUNTER — Encounter (HOSPITAL_COMMUNITY): Payer: Self-pay | Admitting: Medical

## 2020-02-02 DIAGNOSIS — Z62819 Personal history of unspecified abuse in childhood: Secondary | ICD-10-CM

## 2020-02-02 DIAGNOSIS — G894 Chronic pain syndrome: Secondary | ICD-10-CM | POA: Diagnosis not present

## 2020-02-02 DIAGNOSIS — F4312 Post-traumatic stress disorder, chronic: Secondary | ICD-10-CM

## 2020-02-02 DIAGNOSIS — F99 Mental disorder, not otherwise specified: Secondary | ICD-10-CM

## 2020-02-02 DIAGNOSIS — F5105 Insomnia due to other mental disorder: Secondary | ICD-10-CM

## 2020-02-02 DIAGNOSIS — F331 Major depressive disorder, recurrent, moderate: Secondary | ICD-10-CM

## 2020-02-02 DIAGNOSIS — F3181 Bipolar II disorder: Secondary | ICD-10-CM

## 2020-02-02 DIAGNOSIS — F5101 Primary insomnia: Secondary | ICD-10-CM

## 2020-02-02 DIAGNOSIS — F17200 Nicotine dependence, unspecified, uncomplicated: Secondary | ICD-10-CM

## 2020-02-02 DIAGNOSIS — Z8739 Personal history of other diseases of the musculoskeletal system and connective tissue: Secondary | ICD-10-CM

## 2020-02-02 DIAGNOSIS — M797 Fibromyalgia: Secondary | ICD-10-CM

## 2020-02-02 DIAGNOSIS — Z6837 Body mass index (BMI) 37.0-37.9, adult: Secondary | ICD-10-CM

## 2020-02-02 DIAGNOSIS — Z639 Problem related to primary support group, unspecified: Secondary | ICD-10-CM

## 2020-02-02 DIAGNOSIS — M5136 Other intervertebral disc degeneration, lumbar region: Secondary | ICD-10-CM

## 2020-02-02 DIAGNOSIS — M1610 Unilateral primary osteoarthritis, unspecified hip: Secondary | ICD-10-CM

## 2020-02-02 DIAGNOSIS — G8929 Other chronic pain: Secondary | ICD-10-CM

## 2020-02-02 DIAGNOSIS — F419 Anxiety disorder, unspecified: Secondary | ICD-10-CM

## 2020-02-02 MED ORDER — LAMOTRIGINE 200 MG PO TABS: 200.0000 mg | ORAL_TABLET | Freq: Every day | ORAL | 1 refills | Status: DC

## 2020-02-02 MED ORDER — DULOXETINE HCL 60 MG PO CPEP: 60.0000 mg | ORAL_CAPSULE | Freq: Every day | ORAL | 1 refills | Status: DC

## 2020-02-02 MED ORDER — FLUOXETINE HCL 20 MG PO CAPS: 20.0000 mg | ORAL_CAPSULE | Freq: Every day | ORAL | 1 refills | Status: DC
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
## 2020-02-07 ENCOUNTER — Ambulatory Visit (INDEPENDENT_AMBULATORY_CARE_PROVIDER_SITE_OTHER): Payer: BLUE CROSS/BLUE SHIELD | Admitting: Physician Assistant

## 2020-02-07 DIAGNOSIS — Z5329 Procedure and treatment not carried out because of patient's decision for other reasons: Secondary | ICD-10-CM

## 2020-02-07 NOTE — Progress Notes (Signed)
No show

## 2020-02-14 ENCOUNTER — Other Ambulatory Visit: Payer: Self-pay

## 2020-02-14 ENCOUNTER — Ambulatory Visit (INDEPENDENT_AMBULATORY_CARE_PROVIDER_SITE_OTHER): Payer: BLUE CROSS/BLUE SHIELD

## 2020-02-14 ENCOUNTER — Telehealth (INDEPENDENT_AMBULATORY_CARE_PROVIDER_SITE_OTHER): Payer: BLUE CROSS/BLUE SHIELD | Admitting: Medical-Surgical

## 2020-02-14 ENCOUNTER — Encounter: Payer: Self-pay | Admitting: Medical-Surgical

## 2020-02-14 DIAGNOSIS — G629 Polyneuropathy, unspecified: Secondary | ICD-10-CM | POA: Diagnosis not present

## 2020-02-14 DIAGNOSIS — G5793 Unspecified mononeuropathy of bilateral lower limbs: Secondary | ICD-10-CM | POA: Diagnosis not present

## 2020-02-14 DIAGNOSIS — N2889 Other specified disorders of kidney and ureter: Secondary | ICD-10-CM | POA: Diagnosis not present

## 2020-02-14 DIAGNOSIS — R202 Paresthesia of skin: Secondary | ICD-10-CM | POA: Diagnosis not present

## 2020-02-14 DIAGNOSIS — M47816 Spondylosis without myelopathy or radiculopathy, lumbar region: Secondary | ICD-10-CM | POA: Diagnosis not present

## 2020-02-14 NOTE — Telephone Encounter (Signed)
Appeal submitted for [employer] through CoverMyMeds.

## 2020-02-14 NOTE — Progress Notes (Signed)
Virtual Visit via Video Note  I connected with Kathryn Cline on 02/14/20 at  2:40 PM EDT by a video enabled telemedicine application and verified that I am speaking with the correct person using two identifiers.   I discussed the limitations of evaluation and management by telemedicine and the availability of in person appointments. The patient expressed understanding and agreed to proceed.  Patient location: home Provider locations: office  Subjective:    CC: Worsening neuropathy  HPI: Pleasant 44 year old female presenting via [place] video visit for complaints of worsening neuropathy and possible med changes.  Notes that since Friday, her bilateral lower extremity neuropathy has been severe.  She was unable to walk over the weekend and spent Saturday and Sunday in bed.  She was able to get up yesterday and go to the bathroom.  Reports the pain is rated at 7/10 now but if she is walking it increases to 10/10.  Notes that her feet are normally hot and feel like they are burning so she never wear socks but she has had to wear socks since Friday because her feet are freezing cold.  She does have numbness and tingling to both lower extremities.  She has had some nausea and hot flashes and reports 2 episodes yesterday of profuse sweating.  No other constitutional symptoms.  She is currently taking Cymbalta 60 mg daily.  This is prescribed by psychiatry downstairs.  She has been unable to tolerate gabapentin, Lyrica, Effexor, and amitriptyline.  She has also tried several topicals in the past which were unsuccessful at controlling her symptoms.  She does have a history of lumbar spinal issues but has not had any recent injury and has not been lifting/pushing/pulling heavy objects.  No recent lumbar spine imaging.  States that she has to be completely sedated to be able to tolerate an MRI.   Past medical history, Surgical history, Family history not pertinant except as noted below, Social history,  Allergies, and medications have been entered into the medical record, reviewed, and corrections made.   Review of Systems: See HPI for pertinent positives and negatives.   Objective:    General: Speaking clearly in complete sentences without any shortness of breath.  Alert and oriented x3.  Normal judgment. No apparent acute distress.  Impression and Recommendations:    1. Neuropathy ([place]) With her current allergy list and intolerance to several medicines that we treat neuropathy with, we are at a bit of a standstill.  There is still we will room on her Cymbalta dose but this is prescribed by psychiatry and I am not comfortable changing dosages of medications without their input.  She has had no imaging of her lumbar spine recently, we will get a lumbar spine x-ray today.  Would like to get a lumbar MRI.  Unsure if patient will tolerate without being fully sedated.  Continue tramadol, current dose of Cymbalta, and Celebrex as prescribed.  Has a follow-up appointment next week with Jade.  Also recommend following with Dr. Darene Lamer for evaluation of possible lumbar spine etiology for worsening neuropathy. - DG Lumbar Spine Complete; Future  Return if symptoms worsen or fail to improve.  20 minutes of non face-to-face time was provided during this encounter.  I discussed the assessment and treatment plan with the patient. The patient was provided an opportunity to ask questions and all were answered. The patient agreed with the plan and demonstrated an understanding of the instructions.   The patient was advised to call back or seek  an in-person evaluation if the symptoms worsen or if the condition fails to improve as anticipated.  Clearnce Sorrel, DNP, APRN, FNP-BC [facility]

## 2020-02-20 ENCOUNTER — Ambulatory Visit (INDEPENDENT_AMBULATORY_CARE_PROVIDER_SITE_OTHER): Payer: BLUE CROSS/BLUE SHIELD | Admitting: Physician Assistant

## 2020-02-20 ENCOUNTER — Encounter: Payer: Self-pay | Admitting: Physician Assistant

## 2020-02-20 VITALS — BP 119/63 | HR 80 | Ht 68.0 in | Wt 296.0 lb

## 2020-02-20 DIAGNOSIS — M797 Fibromyalgia: Secondary | ICD-10-CM | POA: Diagnosis not present

## 2020-02-20 DIAGNOSIS — N390 Urinary tract infection, site not specified: Secondary | ICD-10-CM

## 2020-02-20 DIAGNOSIS — G629 Polyneuropathy, unspecified: Secondary | ICD-10-CM | POA: Diagnosis not present

## 2020-02-20 DIAGNOSIS — R300 Dysuria: Secondary | ICD-10-CM

## 2020-02-20 LAB — POCT URINALYSIS DIP (CLINITEK)
Bilirubin, UA: NEGATIVE
Blood, UA: NEGATIVE
Glucose, UA: NEGATIVE mg/dL
Ketones, POC UA: NEGATIVE mg/dL
Nitrite, UA: NEGATIVE
POC PROTEIN,UA: 30 — AB
Spec Grav, UA: 1.02 (ref 1.010–1.025)
Urobilinogen, UA: 0.2 E.U./dL
pH, UA: 5 (ref 5.0–8.0)

## 2020-02-20 MED ORDER — NITROFURANTOIN MONOHYD MACRO 100 MG PO CAPS: 100.0000 mg | ORAL_CAPSULE | Freq: Two times a day (BID) | ORAL | 0 refills | Status: DC

## 2020-02-20 MED ORDER — FLUCONAZOLE 150 MG PO TABS: 150.0000 mg | ORAL_TABLET | Freq: Once | ORAL | 0 refills | Status: AC

## 2020-02-20 NOTE — Patient Instructions (Signed)
Charles- cymbalta increase.

## 2020-02-20 NOTE — Progress Notes (Signed)
Please culture.
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
## 2020-02-20 NOTE — Addendum Note (Signed)
Addended by: Dema Severin on: 02/20/2020 02:20 PM   Modules accepted: Orders

## 2020-02-21 LAB — URINE CULTURE
MICRO NUMBER:: 11084795
SPECIMEN QUALITY:: ADEQUATE

## 2020-02-22 NOTE — Progress Notes (Signed)
Kathryn Cline,   Culture showed contamination. Are you feeling any better?
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
## 2020-03-01 ENCOUNTER — Telehealth: Payer: Self-pay | Admitting: *Deleted

## 2020-03-01 NOTE — Telephone Encounter (Signed)
PA initiated for Nurtec, awaiting response.

## 2020-03-13 ENCOUNTER — Other Ambulatory Visit: Payer: Self-pay | Admitting: Neurology

## 2020-03-13 DIAGNOSIS — G44229 Chronic tension-type headache, not intractable: Secondary | ICD-10-CM

## 2020-03-13 MED ORDER — TOPIRAMATE 50 MG PO TABS: 50.0000 mg | ORAL_TABLET | Freq: Two times a day (BID) | ORAL | 0 refills | Status: DC

## 2020-03-15 ENCOUNTER — Ambulatory Visit (INDEPENDENT_AMBULATORY_CARE_PROVIDER_SITE_OTHER): Payer: BLUE CROSS/BLUE SHIELD | Admitting: Nurse Practitioner

## 2020-03-15 ENCOUNTER — Encounter: Payer: Self-pay | Admitting: Nurse Practitioner

## 2020-03-15 VITALS — BP 108/71 | HR 75 | Temp 98.1°F | Ht 68.0 in | Wt 295.3 lb

## 2020-03-15 DIAGNOSIS — W57XXXA Bitten or stung by nonvenomous insect and other nonvenomous arthropods, initial encounter: Secondary | ICD-10-CM | POA: Insufficient documentation

## 2020-03-15 DIAGNOSIS — S50861A Insect bite (nonvenomous) of right forearm, initial encounter: Secondary | ICD-10-CM | POA: Insufficient documentation

## 2020-03-15 MED ORDER — DOXYCYCLINE HYCLATE 100 MG PO TABS: 100.0000 mg | ORAL_TABLET | Freq: Two times a day (BID) | ORAL | 0 refills | Status: DC

## 2020-03-15 MED ORDER — CLOBETASOL PROPIONATE 0.05 % EX OINT: 1.0000 "application " | TOPICAL_OINTMENT | Freq: Two times a day (BID) | CUTANEOUS | 1 refills | Status: DC

## 2020-03-15 MED ORDER — FLUCONAZOLE 150 MG PO TABS
150.0000 mg | ORAL_TABLET | Freq: Once | ORAL | 1 refills | Status: AC
Start: 2020-03-15 — End: 2020-03-15

## 2020-03-15 NOTE — Patient Instructions (Signed)
I have called in a prescription for Doxycycline for 3 days and clobetasol ointment.   Insect Bite, Adult An insect bite can make your skin red, itchy, and swollen. Some insects can spread disease to people with a bite. However, most insect bites do not lead to disease, and most are not serious. What are the causes? Insects may bite for many reasons, including:  Hunger.  To defend themselves. Insects that bite include:  Spiders.  Mosquitoes.  Ticks.  Fleas.  Ants.  Flies.  Kissing bugs.  Chiggers. What are the signs or symptoms? Symptoms of this condition include:  Itching or pain in the bite area.  Redness and swelling in the bite area.  An open wound (skin ulcer). Symptoms often last for 2-4 days. In rare cases, a person may have a very bad allergic reaction (anaphylactic reaction) to a bite. Symptoms of an anaphylactic reaction may include:  Feeling warm in the face (flushed). Your face may turn red.  Itchy, red, swollen areas of skin (hives).  Swelling of the: ? Eyes. ? Lips. ? Face. ? Mouth. ? Tongue. ? Throat.  Trouble with any of these: ? Breathing. ? Talking. ? Swallowing.  Loud breathing (wheezing).  Feeling dizzy or light-headed.  Passing out (fainting).  Pain or cramps in your belly.  Throwing up (vomiting).  Watery poop (diarrhea). How is this treated? Treatment is usually not needed. Symptoms often go away on their own. When treatment is needed, it may involve:  Putting a cream or lotion on the bite area. This helps with itching.  Taking an antibiotic medicine. This treatment is needed if the bite area gets infected.  Getting a tetanus shot, if you are not up to date on this vaccine.  Putting ice on the affected area.  Using medicines called antihistamines. This treatment may be needed if you have itching or an allergic reaction to the insect bite.  Giving yourself a shot of medicine (epinephrine) using an auto-injector "pen"  if you have an anaphylactic reaction to a bite. Your doctor will teach you how to use this pen. Follow these instructions at home: Bite area care   Do not scratch the bite area.  Keep the bite area clean and dry.  Wash the bite area every day with soap and water as told by your doctor.  Check the bite area every day for signs of infection. Check for: ? Redness, swelling, or pain. ? Fluid or blood. ? Warmth. ? Pus or a bad smell. Managing pain, itching, and swelling   You may put any of these on the bite area as told by your doctor: ? A paste made of baking soda and water. ? Cortisone cream. ? Calamine lotion.  If told, put ice on the bite area. ? Put ice in a plastic bag. ? Place a towel between your skin and the bag. ? Leave the ice on for 20 minutes, 2-3 times a day. General instructions  Apply or take over-the-counter and prescription medicines only as told by your doctor.  If you were prescribed an antibiotic medicine, take or apply it as told by your doctor. Do not stop using the antibiotic even if your condition improves.  Keep all follow-up visits as told by your doctor. This is important. How is this prevented? To help you have a lower risk of insect bites:  When you are outside, wear clothing that covers your arms and legs.  Use insect repellent. The best insect repellents contain one of these: ?  DEET. ? Picaridin. ? Oil of lemon eucalyptus (OLE). ? IR3535.  Consider spraying your clothing with a pesticide called permethrin. Permethrin helps prevent insect bites. It works for several weeks and for up to 5-6 clothing washes. Do not apply permethrin directly to the skin.  If your home windows do not have screens, think about putting some in.  If you will be sleeping in an area where there are mosquitoes, consider covering your sleeping area with a mosquito net. Contact a doctor if:  You have redness, swelling, or pain in the bite area.  You have fluid or  blood coming from the bite area.  The bite area feels warm to the touch.  You have pus or a bad smell coming from the bite area.  You have a fever. Get help right away if:  You have joint pain.  You have a rash.  You feel more tired or sleepy than you normally do.  You have neck pain.  You have a headache.  You feel weaker than you normally do.  You have signs of an anaphylactic reaction. Signs may include: ? Feeling warm in the face. ? Itchy, red, swollen areas of skin. ? Swelling of your:  Eyes.  Lips.  Face.  Mouth.  Tongue.  Throat. ? Trouble with any of these:  Breathing.  Talking.  Swallowing. ? Loud breathing. ? Feeling dizzy or light-headed. ? Passing out. ? Pain or cramps in your belly. ? Throwing up. ? Watery poop. These symptoms may be an emergency. Do not wait to see if the symptoms will go away. Do this right away:  Use your auto-injector pen as you have been told.  Get medical help. Call your local emergency services (911 in the [place] Do not drive yourself to the hospital. Summary  An insect bite can make your skin red, itchy, and swollen.  Treatment is usually not needed. Symptoms often go away on their own.  Do not scratch the bite area. Keep it clean and dry.  Ice can help with pain and itching from the bite. This information is not intended to replace advice given to you by your health care provider. Make sure you discuss any questions you have with your health care provider. Document Revised: 10/30/2017 Document Reviewed: 10/30/2017 Elsevier Patient Education  [place].

## 2020-03-15 NOTE — Assessment & Plan Note (Signed)
Apparent insect bite of the right forearm with small amount of erythema surrounding the punctum down. Area is reportedly significant tender to touch.  No signs of purulent drainage more systemic infection present today. Needlepoint forceps utilized to grasp and remove approximately 2 mm in, white foreign object from the punctum. Area cleansed and antibiotic ointment with sterile bandage applied.  Prophylactically treat with doxycycline for 3 days for possible infection. Prescription provided for steroid cream to be utilized no more than twice a day for no more than 7 days to affected area for burning and itching. Fluconazole provided for typical yeast infection recurs with antibiotic use and patient. Instructions on symptom management and warning signs that would warrant further evaluation. Recommend follow-up if symptoms worsen or fail to improve.
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
## 2020-03-20 ENCOUNTER — Other Ambulatory Visit: Payer: Self-pay

## 2020-03-20 ENCOUNTER — Telehealth: Payer: Self-pay | Admitting: Physician Assistant

## 2020-03-20 ENCOUNTER — Emergency Department (HOSPITAL_BASED_OUTPATIENT_CLINIC_OR_DEPARTMENT_OTHER)
Admission: EM | Admit: 2020-03-20 | Discharge: 2020-03-21 | Disposition: A | Discharge status: Home / Self Care | Payer: BLUE CROSS/BLUE SHIELD | Attending: Emergency Medicine | Admitting: Emergency Medicine

## 2020-03-20 ENCOUNTER — Encounter (HOSPITAL_BASED_OUTPATIENT_CLINIC_OR_DEPARTMENT_OTHER): Payer: Self-pay | Admitting: *Deleted

## 2020-03-20 ENCOUNTER — Emergency Department (HOSPITAL_BASED_OUTPATIENT_CLINIC_OR_DEPARTMENT_OTHER): Payer: BLUE CROSS/BLUE SHIELD

## 2020-03-20 DIAGNOSIS — Z79899 Other long term (current) drug therapy: Secondary | ICD-10-CM | POA: Insufficient documentation

## 2020-03-20 DIAGNOSIS — I10 Essential (primary) hypertension: Secondary | ICD-10-CM | POA: Insufficient documentation

## 2020-03-20 DIAGNOSIS — W010XXA Fall on same level from slipping, tripping and stumbling without subsequent striking against object, initial encounter: Secondary | ICD-10-CM | POA: Insufficient documentation

## 2020-03-20 DIAGNOSIS — W19XXXA Unspecified fall, initial encounter: Secondary | ICD-10-CM

## 2020-03-20 DIAGNOSIS — M545 Low back pain, unspecified: Secondary | ICD-10-CM | POA: Insufficient documentation

## 2020-03-20 DIAGNOSIS — M25551 Pain in right hip: Secondary | ICD-10-CM | POA: Diagnosis not present

## 2020-03-20 DIAGNOSIS — Z87891 Personal history of nicotine dependence: Secondary | ICD-10-CM | POA: Insufficient documentation

## 2020-03-20 DIAGNOSIS — J45909 Unspecified asthma, uncomplicated: Secondary | ICD-10-CM | POA: Diagnosis not present

## 2020-03-20 MED ORDER — OXYCODONE-ACETAMINOPHEN 5-325 MG PO TABS
1.0000 | ORAL_TABLET | ORAL | 0 refills | Status: DC | PRN
Start: 2020-03-20 — End: 2020-03-23

## 2020-03-20 MED ORDER — HYDROMORPHONE HCL 1 MG/ML IJ SOLN
INTRAMUSCULAR | Status: AC
  Administered 2020-03-20: 1 mg via INTRAVENOUS
  Filled 2020-03-20: qty 1

## 2020-03-20 MED ORDER — HYDROMORPHONE HCL 1 MG/ML IJ SOLN: 1.0000 mg | Freq: Once | INTRAMUSCULAR | Status: AC

## 2020-03-20 MED ORDER — HYDROMORPHONE HCL 1 MG/ML IJ SOLN
1.0000 mg | Freq: Once | INTRAMUSCULAR | Status: AC
  Administered 2020-03-20: 1 mg via INTRAVENOUS
  Filled 2020-03-20: qty 1

## 2020-03-20 NOTE — Telephone Encounter (Signed)
Pt called and is having really bad Hip pain with her hip that she has broke and is wondering if she can take something stronger than Tramadol.
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
## 2020-03-20 NOTE — ED Notes (Signed)
Patient transported to X-ray 

## 2020-03-20 NOTE — Telephone Encounter (Signed)
Spoke with patient, she states no new injury, pain seems to be getting worse in the last couple weeks. She can not walk for long distances without resting. I made appt for tomorrow for evaluation in the office.

## 2020-03-20 NOTE — ED Triage Notes (Signed)
Right hip pain. Last night she tripped over a toy and slipped. She did not fall.

## 2020-03-20 NOTE — ED Notes (Signed)
Discharge instructions discussed with patient. Verbalized understanding. Departs ED at this time in stable condition.
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
## 2020-03-21 ENCOUNTER — Ambulatory Visit: Payer: BLUE CROSS/BLUE SHIELD | Admitting: Physician Assistant

## 2020-03-22 ENCOUNTER — Telehealth: Payer: Self-pay | Admitting: *Deleted

## 2020-03-22 NOTE — Telephone Encounter (Signed)
Another PA submitted for Nurtec.  Awaiting response.

## 2020-03-23 ENCOUNTER — Ambulatory Visit (INDEPENDENT_AMBULATORY_CARE_PROVIDER_SITE_OTHER): Payer: BLUE CROSS/BLUE SHIELD | Admitting: Physician Assistant

## 2020-03-23 ENCOUNTER — Other Ambulatory Visit: Payer: Self-pay

## 2020-03-23 VITALS — BP 109/61 | HR 78 | Ht 68.0 in | Wt 299.0 lb

## 2020-03-23 DIAGNOSIS — M25551 Pain in right hip: Secondary | ICD-10-CM | POA: Diagnosis not present

## 2020-03-23 DIAGNOSIS — S99921A Unspecified injury of right foot, initial encounter: Secondary | ICD-10-CM

## 2020-03-23 DIAGNOSIS — W19XXXD Unspecified fall, subsequent encounter: Secondary | ICD-10-CM

## 2020-03-23 DIAGNOSIS — Z6841 Body Mass Index (BMI) 40.0 and over, adult: Secondary | ICD-10-CM

## 2020-03-23 DIAGNOSIS — L299 Pruritus, unspecified: Secondary | ICD-10-CM

## 2020-03-23 DIAGNOSIS — M2669 Other specified disorders of temporomandibular joint: Secondary | ICD-10-CM

## 2020-03-23 DIAGNOSIS — L03031 Cellulitis of right toe: Secondary | ICD-10-CM

## 2020-03-23 DIAGNOSIS — R21 Rash and other nonspecific skin eruption: Secondary | ICD-10-CM

## 2020-03-23 MED ORDER — OXYCODONE-ACETAMINOPHEN 5-325 MG PO TABS
1.0000 | ORAL_TABLET | Freq: Four times a day (QID) | ORAL | 0 refills | Status: DC | PRN
Start: 2020-03-23 — End: 2020-10-12

## 2020-03-23 MED ORDER — METHYLPREDNISOLONE SODIUM SUCC 125 MG IJ SOLR
125.0000 mg | Freq: Once | INTRAMUSCULAR | Status: AC
  Administered 2020-03-23: 125 mg via INTRAMUSCULAR

## 2020-03-23 MED ORDER — WEGOVY 0.25 MG/0.5ML ~~LOC~~ SOAJ: 0.2500 mg | SUBCUTANEOUS | 0 refills | Status: DC

## 2020-03-23 MED ORDER — DOXYCYCLINE HYCLATE 100 MG PO TABS: 100.0000 mg | ORAL_TABLET | Freq: Two times a day (BID) | ORAL | 0 refills | Status: DC

## 2020-03-23 MED ORDER — METHOCARBAMOL 500 MG PO TABS: 500.0000 mg | ORAL_TABLET | Freq: Three times a day (TID) | ORAL | 0 refills | Status: DC

## 2020-03-23 NOTE — Patient Instructions (Signed)
Solumedrol today.  Topical steroid as needed.  Doxycycline for toe injection wegovy weight Percocet for next few days.  Hold on MRI.
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
## 2020-03-26 ENCOUNTER — Encounter: Payer: Self-pay | Admitting: Physician Assistant

## 2020-03-26 NOTE — Telephone Encounter (Signed)
[employer] approved.  Pharmacy notified.

## 2020-03-27 ENCOUNTER — Encounter: Payer: Self-pay | Admitting: Physician Assistant

## 2020-04-03 ENCOUNTER — Ambulatory Visit (INDEPENDENT_AMBULATORY_CARE_PROVIDER_SITE_OTHER): Payer: BLUE CROSS/BLUE SHIELD | Admitting: Physician Assistant

## 2020-04-03 ENCOUNTER — Other Ambulatory Visit: Payer: Self-pay

## 2020-04-03 VITALS — BP 129/81 | HR 82 | Ht 68.0 in

## 2020-04-03 DIAGNOSIS — M76822 Posterior tibial tendinitis, left leg: Secondary | ICD-10-CM | POA: Insufficient documentation

## 2020-04-03 DIAGNOSIS — M25572 Pain in left ankle and joints of left foot: Secondary | ICD-10-CM | POA: Diagnosis not present

## 2020-04-03 MED ORDER — DICLOFENAC SODIUM 1 % EX GEL: 4.0000 g | Freq: Four times a day (QID) | CUTANEOUS | 1 refills | Status: DC

## 2020-04-03 NOTE — Patient Instructions (Signed)
Posterior Tibial Tendinitis Rehab Ask your health care provider which exercises are safe for you. Do exercises exactly as told by your health care provider and adjust them as directed. It is normal to feel mild stretching, pulling, tightness, or discomfort as you do these exercises. Stop right away if you feel sudden pain or your pain gets worse. Do not begin these exercises until told by your health care provider. Stretching and range-of-motion exercises These exercises warm up your muscles and joints and improve the movement and flexibility in your ankle and foot. These exercises may also help to relieve pain. Standing wall calf stretch, knee straight  1. Stand with your hands against a wall. 2. Extend your left / right leg behind you, and bend your front knee slightly. If directed, place a folded washcloth under the arch of your foot for support. 3. Point the toes of your back foot slightly inward. 4. Keeping your heels on the floor and your back knee straight, shift your weight toward the wall. Do not allow your back to arch. You should feel a gentle stretch in your upper left / right calf. 5. Hold this position for __________ seconds. Repeat __________ times. Complete this exercise __________ times a day. Standing wall calf stretch, knee bent 1. Stand with your hands against a wall. 2. Extend your left / right leg behind you, and bend your front knee slightly. If directed, place a folded washcloth under the arch of your foot for support. 3. Point the toes of your back foot slightly inward. 4. Unlock your back knee so it is bent. Keep your heels on the floor. You should feel a gentle stretch deep in your lower left / right calf. 5. Hold this position for __________ seconds. Repeat __________ times. Complete this exercise __________ times a day. Strengthening exercises These exercises build strength and endurance in your ankle and foot. Endurance is the ability to use your muscles for a long  time, even after they get tired. Ankle inversion with band 1. Secure one end of a rubber exercise band or tubing to a fixed object, such as a table leg or a pole, that will stay still when the band is pulled. 2. Loop the other end of the band around the middle of your left / right foot. 3. Sit on the floor facing the object with your left / right leg extended. The band or tube should be slightly tense when your foot is relaxed. 4. Leading with your big toe, slowly bring your left / right foot and ankle inward, toward your other foot (inversion). 5. Hold this position for __________ seconds. 6. Slowly return your foot to the starting position. Repeat __________ times. Complete this exercise __________ times a day. Towel curls  1. Sit in a chair on a non-carpeted surface, and put your feet on the floor. 2. Place a towel in front of your feet. If told by your health care provider, add a __________ weight to the end of the towel. 3. Keeping your heel on the floor, put your left / right foot on the towel. 4. Pull the towel toward you by grabbing the towel with your toes and curling them under. Keep your heel on the floor while you do this. 5. Let your toes relax. 6. Grab the towel with your toes again. Keep going until the towel is completely underneath your foot. Repeat __________ times. Complete this exercise __________ times a day. Balance exercise This exercise improves or maintains your balance. Balance  is important in preventing falls. Single leg stand 1. Without wearing shoes, stand near a railing or in a doorway. You may hold on to the railing or door frame as needed for balance. 2. Stand on your left / right foot. Keep your big toe down on the floor and try to keep your arch lifted. ? If balancing in this position is too easy, try the exercise with your eyes closed or while standing on a pillow. 3. Hold this position for __________ seconds. Repeat __________ times. Complete this exercise  __________ times a day. This information is not intended to replace advice given to you by your health care provider. Make sure you discuss any questions you have with your health care provider. Document Revised: 08/17/2018 Document Reviewed: 06/23/2018 Elsevier Patient Education  [place].
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
## 2020-04-04 ENCOUNTER — Encounter: Payer: Self-pay | Admitting: Physician Assistant

## 2020-04-24 ENCOUNTER — Other Ambulatory Visit: Payer: Self-pay | Admitting: Physician Assistant

## 2020-04-24 DIAGNOSIS — M545 Low back pain, unspecified: Secondary | ICD-10-CM

## 2020-04-24 DIAGNOSIS — M4802 Spinal stenosis, cervical region: Secondary | ICD-10-CM

## 2020-04-24 DIAGNOSIS — G8929 Other chronic pain: Secondary | ICD-10-CM

## 2020-05-03 ENCOUNTER — Other Ambulatory Visit: Payer: Self-pay | Admitting: Physician Assistant

## 2020-05-03 DIAGNOSIS — R600 Localized edema: Secondary | ICD-10-CM

## 2020-05-11 ENCOUNTER — Other Ambulatory Visit: Payer: Self-pay

## 2020-05-11 ENCOUNTER — Telehealth (INDEPENDENT_AMBULATORY_CARE_PROVIDER_SITE_OTHER): Payer: Self-pay | Admitting: Sports Medicine

## 2020-05-11 DIAGNOSIS — B349 Viral infection, unspecified: Secondary | ICD-10-CM | POA: Insufficient documentation

## 2020-05-11 DIAGNOSIS — J4541 Moderate persistent asthma with (acute) exacerbation: Secondary | ICD-10-CM

## 2020-05-11 MED ORDER — ALBUTEROL SULFATE HFA 108 (90 BASE) MCG/ACT IN AERS: 1.0000 | INHALATION_SPRAY | Freq: Four times a day (QID) | RESPIRATORY_TRACT | 1 refills | Status: AC | PRN

## 2020-05-11 MED ORDER — DEXAMETHASONE 4 MG PO TABS: 4.0000 mg | ORAL_TABLET | Freq: Three times a day (TID) | ORAL | 0 refills | Status: DC

## 2020-05-11 NOTE — Assessment & Plan Note (Signed)
This is a pleasant 45 year old female, she has had her full vaccination series, she was exposed to multiple people with Covid over Christmas. Now she has headaches, fevers, chills, muscle aches, body aches, as well as anosmia and ageusia. She is speaking full sentences on the phone. Out of the window for Tamiflu, adding Decadron, refilling albuterol, she can see her PCP next week.

## 2020-05-11 NOTE — Progress Notes (Signed)
Can't taste anything , chest congestion, cough, Pfizer Covid vaccines x 2, 6 days. Daughter tested mid week but no results yet. Ear pain, nose dry

## 2020-05-11 NOTE — Progress Notes (Signed)
   Virtual Visit via Telephone   I connected with  Kathryn Cline  on 05/11/20 by telephone/telehealth and verified that I am speaking with the correct person using two identifiers.   I discussed the limitations, risks, security and privacy concerns of performing an evaluation and management service by telephone, including the higher likelihood of inaccurate diagnosis and treatment, and the availability of in person appointments.  We also discussed the likely need of an additional face to face encounter for complete and high quality delivery of care.  I also discussed with the patient that there may be a patient responsible charge related to this service. The patient expressed understanding and wishes to proceed.  Provider location is in medical facility. Patient location is at their home, different from provider location. People involved in care of the patient during this telehealth encounter were myself, my nurse/medical assistant, and my front office/scheduling team member.  Review of Systems: No fevers, chills, night sweats, weight loss, chest pain, or shortness of breath.   Objective Findings:    General: Speaking full sentences, no audible heavy breathing.  Sounds alert and appropriately interactive.   Independent interpretation of tests performed by another provider:   None.  Brief History, Exam, Impression, and Recommendations:    Viral syndrome This is a pleasant 45 year old female, she has had her full vaccination series, she was exposed to multiple people with Covid over Christmas. Now she has headaches, fevers, chills, muscle aches, body aches, as well as anosmia and ageusia. She is speaking full sentences on the phone. Out of the window for Tamiflu, adding Decadron, refilling albuterol, she can see her PCP next week.   I discussed the above assessment and treatment plan with the patient. The patient was provided an opportunity to ask questions and all were answered. The  patient agreed with the plan and demonstrated an understanding of the instructions.   The patient was advised to call back or seek an in-person evaluation if the symptoms worsen or if the condition fails to improve as anticipated.   I provided 30 minutes of face to face and non-face-to-face time during this encounter date, time was needed to gather information, review chart, records, communicate/coordinate with staff remotely, as well as complete documentation.   ___________________________________________ Gwen Her. Dianah Field, M.D., ABFM., CAQSM. Primary Care and Sports Medicine [facility]  Adjunct Professor of [place] [employer]

## 2020-06-07 ENCOUNTER — Telehealth (INDEPENDENT_AMBULATORY_CARE_PROVIDER_SITE_OTHER): Payer: Commercial Managed Care - PPO | Admitting: Medical

## 2020-06-07 ENCOUNTER — Encounter (HOSPITAL_COMMUNITY): Payer: Self-pay | Admitting: Medical

## 2020-06-07 ENCOUNTER — Other Ambulatory Visit: Payer: Self-pay

## 2020-06-07 DIAGNOSIS — Z6372 Alcoholism and drug addiction in family: Secondary | ICD-10-CM | POA: Diagnosis not present

## 2020-06-07 DIAGNOSIS — G894 Chronic pain syndrome: Secondary | ICD-10-CM

## 2020-06-07 DIAGNOSIS — M5136 Other intervertebral disc degeneration, lumbar region: Secondary | ICD-10-CM

## 2020-06-07 DIAGNOSIS — Z8739 Personal history of other diseases of the musculoskeletal system and connective tissue: Secondary | ICD-10-CM

## 2020-06-07 DIAGNOSIS — F341 Dysthymic disorder: Secondary | ICD-10-CM | POA: Diagnosis not present

## 2020-06-07 DIAGNOSIS — F331 Major depressive disorder, recurrent, moderate: Secondary | ICD-10-CM

## 2020-06-07 DIAGNOSIS — F4312 Post-traumatic stress disorder, chronic: Secondary | ICD-10-CM

## 2020-06-07 DIAGNOSIS — Z639 Problem related to primary support group, unspecified: Secondary | ICD-10-CM

## 2020-06-07 DIAGNOSIS — Z6837 Body mass index (BMI) 37.0-37.9, adult: Secondary | ICD-10-CM

## 2020-06-07 DIAGNOSIS — M1610 Unilateral primary osteoarthritis, unspecified hip: Secondary | ICD-10-CM

## 2020-06-07 DIAGNOSIS — G8929 Other chronic pain: Secondary | ICD-10-CM

## 2020-06-07 DIAGNOSIS — F5105 Insomnia due to other mental disorder: Secondary | ICD-10-CM

## 2020-06-07 DIAGNOSIS — F419 Anxiety disorder, unspecified: Secondary | ICD-10-CM

## 2020-06-07 DIAGNOSIS — Z62819 Personal history of unspecified abuse in childhood: Secondary | ICD-10-CM | POA: Diagnosis not present

## 2020-06-07 DIAGNOSIS — M545 Low back pain, unspecified: Secondary | ICD-10-CM

## 2020-06-07 DIAGNOSIS — F17200 Nicotine dependence, unspecified, uncomplicated: Secondary | ICD-10-CM

## 2020-06-07 DIAGNOSIS — F99 Mental disorder, not otherwise specified: Secondary | ICD-10-CM

## 2020-06-07 MED ORDER — LAMOTRIGINE 200 MG PO TABS: 200.0000 mg | ORAL_TABLET | Freq: Every day | ORAL | 1 refills | Status: DC

## 2020-06-07 MED ORDER — FLUOXETINE HCL 20 MG PO CAPS: 20.0000 mg | ORAL_CAPSULE | Freq: Every day | ORAL | 1 refills | Status: DC

## 2020-06-07 MED ORDER — DULOXETINE HCL 30 MG PO CPEP: 30.0000 mg | ORAL_CAPSULE | Freq: Three times a day (TID) | ORAL | 1 refills | Status: DC
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
## 2020-06-12 ENCOUNTER — Ambulatory Visit (INDEPENDENT_AMBULATORY_CARE_PROVIDER_SITE_OTHER): Payer: Commercial Managed Care - PPO | Admitting: Physician Assistant

## 2020-06-12 ENCOUNTER — Other Ambulatory Visit: Payer: Self-pay

## 2020-06-12 VITALS — BP 137/81 | HR 81 | Ht 68.0 in | Wt 299.0 lb

## 2020-06-12 DIAGNOSIS — Z6841 Body Mass Index (BMI) 40.0 and over, adult: Secondary | ICD-10-CM

## 2020-06-12 DIAGNOSIS — D170 Benign lipomatous neoplasm of skin and subcutaneous tissue of head, face and neck: Secondary | ICD-10-CM

## 2020-06-12 DIAGNOSIS — R202 Paresthesia of skin: Secondary | ICD-10-CM

## 2020-06-12 DIAGNOSIS — R200 Anesthesia of skin: Secondary | ICD-10-CM

## 2020-06-12 DIAGNOSIS — R5383 Other fatigue: Secondary | ICD-10-CM

## 2020-06-12 DIAGNOSIS — I10 Essential (primary) hypertension: Secondary | ICD-10-CM

## 2020-06-12 DIAGNOSIS — Z13220 Encounter for screening for lipoid disorders: Secondary | ICD-10-CM

## 2020-06-12 MED ORDER — WEGOVY 0.25 MG/0.5ML ~~LOC~~ SOAJ: 0.2500 mg | SUBCUTANEOUS | 0 refills | Status: DC
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
## 2020-06-12 NOTE — Patient Instructions (Signed)
Will get called by general surgery.

## 2020-06-13 ENCOUNTER — Encounter: Payer: Self-pay | Admitting: Physician Assistant

## 2020-06-13 DIAGNOSIS — R5383 Other fatigue: Secondary | ICD-10-CM | POA: Insufficient documentation

## 2020-06-13 LAB — COMPLETE METABOLIC PANEL WITH GFR
AG Ratio: 2.2 (calc) (ref 1.0–2.5)
ALT: 30 U/L — ABNORMAL HIGH (ref 6–29)
AST: 26 U/L (ref 10–35)
Albumin: 4.4 g/dL (ref 3.6–5.1)
Alkaline phosphatase (APISO): 51 U/L (ref 31–125)
BUN: 12 mg/dL (ref 7–25)
CO2: 27 mmol/L (ref 20–32)
Calcium: 9.3 mg/dL (ref 8.6–10.2)
Chloride: 108 mmol/L (ref 98–110)
Creat: 0.59 mg/dL (ref 0.50–1.10)
GFR, Est African American: 127 mL/min/{1.73_m2} (ref >=60)
GFR, Est Non African American: 110 mL/min/{1.73_m2} (ref >=60)
Globulin: 2 g/dL (calc) (ref 1.9–3.7)
Glucose, Bld: 94 mg/dL (ref 65–99)
Potassium: 4.4 mmol/L (ref 3.5–5.3)
Sodium: 143 mmol/L (ref 135–146)
Total Bilirubin: 0.6 mg/dL (ref 0.2–1.2)
Total Protein: 6.4 g/dL (ref 6.1–8.1)

## 2020-06-13 LAB — LIPID PANEL W/REFLEX DIRECT LDL
Cholesterol: 128 mg/dL (ref <200)
HDL: 22 mg/dL — ABNORMAL LOW (ref >=50)
Non-HDL Cholesterol (Calc): 106 mg/dL (calc) (ref <130)
Total CHOL/HDL Ratio: 5.8 (calc) — ABNORMAL HIGH (ref <5.0)
Triglycerides: 450 mg/dL — ABNORMAL HIGH (ref <150)

## 2020-06-13 LAB — B12 AND FOLATE PANEL
Folate: 7.3 ng/mL
Vitamin B-12: 381 pg/mL (ref 200–1100)

## 2020-06-13 LAB — VITAMIN D 25 HYDROXY (VIT D DEFICIENCY, FRACTURES): Vit D, 25-Hydroxy: 21 ng/mL — ABNORMAL LOW (ref 30–100)

## 2020-06-13 LAB — DIRECT LDL: Direct LDL: 45 mg/dL (ref <100)

## 2020-06-13 LAB — TSH: TSH: 2.61 mIU/L

## 2020-06-15 ENCOUNTER — Other Ambulatory Visit: Payer: Self-pay | Admitting: Physician Assistant

## 2020-06-15 DIAGNOSIS — E559 Vitamin D deficiency, unspecified: Secondary | ICD-10-CM | POA: Insufficient documentation

## 2020-06-15 MED ORDER — VITAMIN D (ERGOCALCIFEROL) 1.25 MG (50000 UNIT) PO CAPS: 50000.0000 [IU] | ORAL_CAPSULE | ORAL | 1 refills | Status: DC

## 2020-06-15 NOTE — Progress Notes (Signed)
Your TG are really high. How much fish oil do you take a day? I would like to start Tricor to help decrease TG. Thoughts.   Thyroid great.   B12 normal but would want you to still take 1036mg daily.   Vitamin D low. Will send over high dose weekly vitamin D.

## 2020-06-15 NOTE — Progress Notes (Signed)
Meds sent

## 2020-06-22 ENCOUNTER — Encounter: Payer: Self-pay | Admitting: Neurology

## 2020-10-04 ENCOUNTER — Other Ambulatory Visit (HOSPITAL_COMMUNITY): Payer: Self-pay | Admitting: Medical

## 2020-10-04 DIAGNOSIS — M545 Low back pain, unspecified: Secondary | ICD-10-CM

## 2020-10-04 DIAGNOSIS — F419 Anxiety disorder, unspecified: Secondary | ICD-10-CM

## 2020-10-04 DIAGNOSIS — F331 Major depressive disorder, recurrent, moderate: Secondary | ICD-10-CM

## 2020-10-04 DIAGNOSIS — G8929 Other chronic pain: Secondary | ICD-10-CM

## 2020-10-12 ENCOUNTER — Other Ambulatory Visit: Payer: Self-pay

## 2020-10-12 ENCOUNTER — Ambulatory Visit (INDEPENDENT_AMBULATORY_CARE_PROVIDER_SITE_OTHER): Payer: Commercial Managed Care - PPO | Admitting: Sports Medicine

## 2020-10-12 DIAGNOSIS — L03116 Cellulitis of left lower limb: Secondary | ICD-10-CM | POA: Insufficient documentation

## 2020-10-12 DIAGNOSIS — R600 Localized edema: Secondary | ICD-10-CM

## 2020-10-12 DIAGNOSIS — G44229 Chronic tension-type headache, not intractable: Secondary | ICD-10-CM

## 2020-10-12 MED ORDER — FUROSEMIDE 40 MG PO TABS: ORAL_TABLET | ORAL | 0 refills | Status: DC

## 2020-10-12 MED ORDER — TOPIRAMATE 50 MG PO TABS: 50.0000 mg | ORAL_TABLET | Freq: Two times a day (BID) | ORAL | 0 refills | Status: DC

## 2020-10-12 MED ORDER — DOXYCYCLINE HYCLATE 100 MG PO TABS: 100.0000 mg | ORAL_TABLET | Freq: Two times a day (BID) | ORAL | 0 refills | Status: AC

## 2020-10-12 NOTE — Telephone Encounter (Signed)
Patient came in to see Dr. Darene Lamer for an acute issue and requested these refills. Updated her med list and verified pharmacy.

## 2020-10-12 NOTE — Assessment & Plan Note (Signed)
This is a pleasant 45 year old female, couple days ago she noted some swelling and redness on her left foot, on exam she has some erythema what appears to be mild cellulitis on the plantar aspect of her foot, just proximal to the first MTP. I do not see any signs of foreign body, we will do a course of doxycycline for a week, warm compresses, if things improve I do not need to see her back, if not we will investigate further.

## 2020-10-12 NOTE — Progress Notes (Signed)
    Procedures performed today:    None.  Independent interpretation of notes and tests performed by another provider:   None.  Brief History, Exam, Impression, and Recommendations:    Cellulitis of foot, left This is a pleasant 45 year old female, couple days ago she noted some swelling and redness on her left foot, on exam she has some erythema what appears to be mild cellulitis on the plantar aspect of her foot, just proximal to the first MTP. I do not see any signs of foreign body, we will do a course of doxycycline for a week, warm compresses, if things improve I do not need to see her back, if not we will investigate further.    ___________________________________________ Gwen Her. Dianah Field, M.D., ABFM., CAQSM. Primary Care and [place] Instructor of [place] [facility]

## 2020-10-26 ENCOUNTER — Ambulatory Visit: Payer: Commercial Managed Care - PPO | Admitting: Sports Medicine

## 2020-11-16 ENCOUNTER — Other Ambulatory Visit: Payer: Self-pay | Admitting: Physician Assistant

## 2020-11-16 DIAGNOSIS — R600 Localized edema: Secondary | ICD-10-CM

## 2020-12-12 ENCOUNTER — Ambulatory Visit: Payer: Commercial Managed Care - PPO | Admitting: Sports Medicine

## 2021-01-11 ENCOUNTER — Ambulatory Visit (INDEPENDENT_AMBULATORY_CARE_PROVIDER_SITE_OTHER): Payer: Commercial Managed Care - PPO | Admitting: Medical-Surgical

## 2021-01-11 ENCOUNTER — Ambulatory Visit (INDEPENDENT_AMBULATORY_CARE_PROVIDER_SITE_OTHER): Payer: Commercial Managed Care - PPO

## 2021-01-11 ENCOUNTER — Encounter: Payer: Self-pay | Admitting: Medical-Surgical

## 2021-01-11 ENCOUNTER — Other Ambulatory Visit: Payer: Self-pay

## 2021-01-11 DIAGNOSIS — R079 Chest pain, unspecified: Secondary | ICD-10-CM

## 2021-01-11 DIAGNOSIS — M2669 Other specified disorders of temporomandibular joint: Secondary | ICD-10-CM

## 2021-01-11 MED ORDER — CYCLOBENZAPRINE HCL 10 MG PO TABS: 10.0000 mg | ORAL_TABLET | Freq: Every day | ORAL | 1 refills | Status: DC
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
## 2021-01-11 NOTE — Patient Instructions (Signed)
Ibuprofen 849m every 8 hours as needed Tylenol 1,0051mevery 8 hours as needed Flexeril 1051m times daily as needed

## 2021-04-03 ENCOUNTER — Ambulatory Visit (INDEPENDENT_AMBULATORY_CARE_PROVIDER_SITE_OTHER): Payer: Commercial Managed Care - PPO | Admitting: Physician Assistant

## 2021-04-03 ENCOUNTER — Encounter: Payer: Self-pay | Admitting: Physician Assistant

## 2021-04-03 ENCOUNTER — Other Ambulatory Visit: Payer: Self-pay

## 2021-04-03 VITALS — BP 159/89 | HR 92 | Temp 99.2°F | Wt 309.1 lb

## 2021-04-03 DIAGNOSIS — M25531 Pain in right wrist: Secondary | ICD-10-CM

## 2021-04-03 DIAGNOSIS — Z1211 Encounter for screening for malignant neoplasm of colon: Secondary | ICD-10-CM

## 2021-04-03 DIAGNOSIS — R110 Nausea: Secondary | ICD-10-CM | POA: Diagnosis not present

## 2021-04-03 DIAGNOSIS — Z1231 Encounter for screening mammogram for malignant neoplasm of breast: Secondary | ICD-10-CM

## 2021-04-03 DIAGNOSIS — R1012 Left upper quadrant pain: Secondary | ICD-10-CM

## 2021-04-03 DIAGNOSIS — J069 Acute upper respiratory infection, unspecified: Secondary | ICD-10-CM | POA: Diagnosis not present

## 2021-04-03 MED ORDER — MELOXICAM 15 MG PO TABS: 15.0000 mg | ORAL_TABLET | Freq: Every day | ORAL | 0 refills | Status: DC

## 2021-04-03 MED ORDER — FLUTICASONE PROPIONATE 50 MCG/ACT NA SUSP: 2.0000 | Freq: Every day | NASAL | 0 refills | Status: DC

## 2021-04-03 MED ORDER — VITAMIN D (ERGOCALCIFEROL) 1.25 MG (50000 UNIT) PO CAPS: 50000.0000 [IU] | ORAL_CAPSULE | ORAL | 1 refills | Status: DC

## 2021-04-03 MED ORDER — DULOXETINE HCL 30 MG PO CPEP
30.0000 mg | ORAL_CAPSULE | Freq: Every day | ORAL | 0 refills | Status: DC
Start: 2021-04-03 — End: 2021-06-12
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
## 2021-04-03 NOTE — Patient Instructions (Addendum)
Needs to get a brace wrist AND thumb spica.  Get labs. Will schedule ultrasound of abdomen Restart cymbalta Tylenol cold and sinus and flonase  De Quervain's Tenosynovitis De Quervain's tenosynovitis is a condition that causes inflammation of the tendon on the thumb side of the wrist. Tendons are cords of tissue that connect bones to muscles. The tendons in the hand pass through a tunnel called a sheath. A slippery layer of tissue (synovium) lets the tendons move smoothly in the sheath. With de Quervain's tenosynovitis, the sheath swells or thickens, causing friction and pain. The condition is also called de Quervain's disease and de Quervain's syndrome. It occurs most often in women who are 65-70 years old. What are the causes? The exact cause of this condition is not known. It may be associated with overuse of the hand and wrist. What increases the risk? You are more likely to develop this condition if you: Use your hands far more than normal, especially if you repeat certain movements that involve twisting your hand or using a tight grip. Are pregnant. Are a middle-aged woman. Have rheumatoid arthritis. Have diabetes. What are the signs or symptoms? The main symptom of this condition is pain on the thumb side of the wrist. The pain may get worse when you grasp something or turn your wrist. Other symptoms may include: Pain that extends up the forearm. Swelling of your wrist and hand. Trouble moving the thumb and wrist. A sensation of snapping in the wrist. A bump filled with fluid (cyst) in the area of the pain. How is this diagnosed? This condition may be diagnosed based on: Your symptoms and medical history. A physical exam. During the exam, your health care provider may do a simple test W[place] test) that involves pulling your thumb and wrist to see if this causes pain. You may also need to have an X-ray or ultrasound. How is this treated? Treatment for this condition may  include: Avoiding any activity that causes pain and swelling. Taking medicines. Anti-inflammatory medicines and corticosteroid injections may be used to reduce inflammation and relieve pain. Wearing a splint. Having surgery. This may be needed if other treatments do not work. Once the pain and swelling have gone down, you may start: Physical therapy. This includes exercises to improve movement and strength in your wrist and thumb. Occupational therapy. This includes adjusting how you move your wrist. Follow these instructions at home: If you have a splint: Wear the splint as told by your health care provider. Remove it only as told by your health care provider. Loosen the splint if your fingers tingle, become numb, or turn cold and blue. Keep the splint clean. If the splint is not waterproof: Do not let it get wet. Cover it with a watertight covering when you take a bath or a shower. Managing pain, stiffness, and swelling  Avoid movements and activities that cause pain and swelling in the wrist area. If directed, put ice on the painful area. This may be helpful after doing activities that involve the sore wrist. To do this: Put ice in a plastic bag. Place a towel between your skin and the bag. Leave the ice on for 20 minutes, 2-3 times a day. Remove the ice if your skin turns bright red. This is very important. If you cannot feel pain, heat, or cold, you have a greater risk of damage to the area. Move your fingers often to reduce stiffness and swelling. Raise (elevate) the injured area above the level of  your heart while you are sitting or lying down. General instructions Return to your normal activities as told by your health care provider. Ask your health care provider what activities are safe for you. Take over-the-counter and prescription medicines only as told by your health care provider. Keep all follow-up visits. This is important. Contact a health care provider if: Your pain  medicine does not help. Your pain gets worse. You develop new symptoms. Summary De Quervain's tenosynovitis is a condition that causes inflammation of the tendon on the thumb side of the wrist. The condition occurs most often in women who are 64-65 years old. The exact cause of this condition is not known. It may be associated with overuse of the hand and wrist. Treatment starts with avoiding activity that causes pain or swelling in the wrist area. Other treatments may include wearing a splint and taking medicine. Sometimes, surgery is needed. This information is not intended to replace advice given to you by your health care provider. Make sure you discuss any questions you have with your health care provider. Document Revised: 08/03/2019 Document Reviewed: 08/03/2019 Elsevier Patient Education  2022 [employer].

## 2021-04-04 LAB — COMPLETE METABOLIC PANEL WITH GFR
AG Ratio: 1.7 (calc) (ref 1.0–2.5)
ALT: 27 U/L (ref 6–29)
AST: 21 U/L (ref 10–35)
Albumin: 4.8 g/dL (ref 3.6–5.1)
Alkaline phosphatase (APISO): 59 U/L (ref 31–125)
BUN: 12 mg/dL (ref 7–25)
CO2: 27 mmol/L (ref 20–32)
Calcium: 10.1 mg/dL (ref 8.6–10.2)
Chloride: 102 mmol/L (ref 98–110)
Creat: 0.7 mg/dL (ref 0.50–0.99)
Globulin: 2.9 g/dL (calc) (ref 1.9–3.7)
Glucose, Bld: 89 mg/dL (ref 65–99)
Potassium: 4.6 mmol/L (ref 3.5–5.3)
Sodium: 140 mmol/L (ref 135–146)
Total Bilirubin: 0.5 mg/dL (ref 0.2–1.2)
Total Protein: 7.7 g/dL (ref 6.1–8.1)
eGFR: 107 mL/min/{1.73_m2} (ref >=60)

## 2021-04-04 LAB — LIPASE: Lipase: 49 U/L (ref 7–60)

## 2021-04-05 NOTE — Progress Notes (Signed)
Labs look great.

## 2021-04-08 ENCOUNTER — Telehealth: Payer: Self-pay

## 2021-04-08 ENCOUNTER — Telehealth: Payer: Self-pay | Admitting: Physician Assistant

## 2021-04-08 MED ORDER — DOXYCYCLINE HYCLATE 100 MG PO TABS: 100.0000 mg | ORAL_TABLET | Freq: Two times a day (BID) | ORAL | 0 refills | Status: DC

## 2021-04-08 NOTE — Telephone Encounter (Signed)
Sent doxycycline for 10 days.

## 2021-04-08 NOTE — Telephone Encounter (Signed)
Attempted to contact pt.  Phone rings and then disconnects.  Unable to leave voicemail.  Charyl Bigger, CMA

## 2021-04-08 NOTE — Telephone Encounter (Signed)
Pt called and LVM stating that she was told during her OV on 04/03/21 that if her URI sx did not improve that she could call back and an atbx would be sent in for her.   I tried to call pt for more info but no answer, no voicemail.

## 2021-04-08 NOTE — Telephone Encounter (Signed)
Pt called in at 2:44 said that she wasn't feeling any better. She said she was told if she wasn't feeling any better that you could call in an antibiotic. Pharmacy on file.

## 2021-04-08 NOTE — Telephone Encounter (Signed)
Abx has already been sent to pharmacy.

## 2021-04-09 NOTE — Telephone Encounter (Signed)
Attempted to contact pt on home phone and cell phone.  Both phones ring, no answer and then phones disconnect.  Unable to leave a VM.  Charyl Bigger, CMA

## 2021-04-12 LAB — COLOGUARD: COLOGUARD: NEGATIVE

## 2021-04-15 NOTE — Progress Notes (Signed)
GREAT news. Cologuard is negative. Repeat in 3 years.

## 2021-05-01 ENCOUNTER — Ambulatory Visit (INDEPENDENT_AMBULATORY_CARE_PROVIDER_SITE_OTHER): Payer: Commercial Managed Care - PPO

## 2021-05-01 ENCOUNTER — Ambulatory Visit (INDEPENDENT_AMBULATORY_CARE_PROVIDER_SITE_OTHER): Payer: Commercial Managed Care - PPO | Admitting: Physician Assistant

## 2021-05-01 ENCOUNTER — Other Ambulatory Visit: Payer: Self-pay

## 2021-05-01 VITALS — BP 143/94 | HR 86 | Ht 68.0 in | Wt 310.0 lb

## 2021-05-01 DIAGNOSIS — M25531 Pain in right wrist: Secondary | ICD-10-CM

## 2021-05-01 DIAGNOSIS — Z6841 Body Mass Index (BMI) 40.0 and over, adult: Secondary | ICD-10-CM

## 2021-05-01 DIAGNOSIS — R600 Localized edema: Secondary | ICD-10-CM | POA: Diagnosis not present

## 2021-05-01 DIAGNOSIS — R140 Abdominal distension (gaseous): Secondary | ICD-10-CM | POA: Diagnosis not present

## 2021-05-01 DIAGNOSIS — R1010 Upper abdominal pain, unspecified: Secondary | ICD-10-CM | POA: Insufficient documentation

## 2021-05-01 DIAGNOSIS — R1012 Left upper quadrant pain: Secondary | ICD-10-CM

## 2021-05-01 DIAGNOSIS — T17308A Unspecified foreign body in larynx causing other injury, initial encounter: Secondary | ICD-10-CM

## 2021-05-01 DIAGNOSIS — E66813 Obesity, class 3: Secondary | ICD-10-CM

## 2021-05-01 DIAGNOSIS — G8929 Other chronic pain: Secondary | ICD-10-CM

## 2021-05-01 MED ORDER — PANTOPRAZOLE SODIUM 40 MG PO TBEC
40.0000 mg | DELAYED_RELEASE_TABLET | Freq: Every day | ORAL | 1 refills | Status: DC
Start: 2021-05-01 — End: 2021-09-06

## 2021-05-01 MED ORDER — FUROSEMIDE 40 MG PO TABS: ORAL_TABLET | ORAL | 1 refills | Status: DC
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
## 2021-05-01 NOTE — Patient Instructions (Addendum)
[facility] [phone]  Increase cymbalta to 35m a day.  Will get u/s of abdomen Start protonix daily in the morning Get wrist xray to determine next steps  Saxenda/wegovy shots for weight loss Contrave pill  Weight watchers

## 2021-05-02 NOTE — Progress Notes (Signed)
Arthritis and degenerative changes seen. Could see about further treatment with Dr. Darene Lamer.

## 2021-05-02 NOTE — Progress Notes (Signed)
Fatty liver and your spleen is large but normal in appearance. Spleen is in the area of discomfort. Weight loss could also help.

## 2021-05-10 ENCOUNTER — Encounter (HOSPITAL_BASED_OUTPATIENT_CLINIC_OR_DEPARTMENT_OTHER): Payer: Self-pay | Admitting: *Deleted

## 2021-05-10 ENCOUNTER — Emergency Department (HOSPITAL_BASED_OUTPATIENT_CLINIC_OR_DEPARTMENT_OTHER)
Admission: EM | Admit: 2021-05-10 | Discharge: 2021-05-10 | Disposition: A | Discharge status: Home / Self Care | Payer: Commercial Managed Care - PPO | Attending: Emergency Medicine | Admitting: Emergency Medicine

## 2021-05-10 ENCOUNTER — Other Ambulatory Visit: Payer: Self-pay

## 2021-05-10 ENCOUNTER — Emergency Department (HOSPITAL_BASED_OUTPATIENT_CLINIC_OR_DEPARTMENT_OTHER): Payer: Commercial Managed Care - PPO

## 2021-05-10 DIAGNOSIS — J45909 Unspecified asthma, uncomplicated: Secondary | ICD-10-CM | POA: Diagnosis not present

## 2021-05-10 DIAGNOSIS — Z7951 Long term (current) use of inhaled steroids: Secondary | ICD-10-CM | POA: Diagnosis not present

## 2021-05-10 DIAGNOSIS — M79671 Pain in right foot: Secondary | ICD-10-CM | POA: Diagnosis present

## 2021-05-10 DIAGNOSIS — I10 Essential (primary) hypertension: Secondary | ICD-10-CM | POA: Insufficient documentation

## 2021-05-10 DIAGNOSIS — M109 Gout, unspecified: Secondary | ICD-10-CM | POA: Diagnosis not present

## 2021-05-10 MED ORDER — NAPROXEN 500 MG PO TABS: 500.0000 mg | ORAL_TABLET | Freq: Two times a day (BID) | ORAL | 0 refills | Status: DC

## 2021-05-10 MED ORDER — PREDNISONE 10 MG (21) PO TBPK: ORAL_TABLET | Freq: Every day | ORAL | 0 refills | Status: DC

## 2021-05-10 NOTE — ED Triage Notes (Signed)
Pain in her right foot since yesterday. No known injury. Swelling noted.

## 2021-05-10 NOTE — Discharge Instructions (Addendum)
You were seen in the emergency department today for foot pain.  As we discussed I think your symptoms are most likely related to gout.  I recommend taking naproxen, and I prescribed this for you.  You can take this twice daily for the next several days.  I looked into the medicine that we talked about colchicine, and I'm going to defer prescribing that at this time.  I will prescribe you prednisone, and this will follow a taper outlined in the package instructions.  This should help with the inflammation, which should result help with some of the pain.  I've also attached some diet recommendations too!  Continue to monitor how you're doing and return to the ER for new or worsening symptoms such as redness continues.   It has been a pleasure seeing and caring for you today and I hope you start feeling better soon!
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
## 2021-05-13 ENCOUNTER — Telehealth: Payer: Self-pay | Admitting: General Practice

## 2021-05-13 NOTE — Telephone Encounter (Signed)
Transition Care Management Unsuccessful Follow-up Telephone Call  Date of discharge and from where:  05/10/21 from [facility]  Attempts:  1st Attempt  Reason for unsuccessful TCM follow-up call:  No answer/busy

## 2021-05-15 NOTE — Telephone Encounter (Signed)
Transition Care Management Unsuccessful Follow-up Telephone Call  Date of discharge and from where:  05/10/21 from [facility]  Attempts:  2nd Attempt  Reason for unsuccessful TCM follow-up call:  No answer/busy

## 2021-05-17 NOTE — Telephone Encounter (Signed)
Transition Care Management Unsuccessful Follow-up Telephone Call  Date of discharge and from where:  05/10/21 from [facility]  Attempts:  3rd Attempt  Reason for unsuccessful TCM follow-up call:  No answer/busy

## 2021-05-27 ENCOUNTER — Encounter: Payer: Self-pay | Admitting: Physician Assistant

## 2021-06-12 ENCOUNTER — Encounter: Payer: Self-pay | Admitting: Physician Assistant

## 2021-06-12 ENCOUNTER — Telehealth: Payer: Self-pay | Admitting: Neurology

## 2021-06-12 ENCOUNTER — Other Ambulatory Visit: Payer: Self-pay

## 2021-06-12 ENCOUNTER — Ambulatory Visit (INDEPENDENT_AMBULATORY_CARE_PROVIDER_SITE_OTHER): Payer: Commercial Managed Care - PPO | Admitting: Physician Assistant

## 2021-06-12 ENCOUNTER — Other Ambulatory Visit: Payer: Self-pay | Admitting: Physician Assistant

## 2021-06-12 VITALS — BP 142/92 | HR 98 | Ht 68.0 in | Wt 311.0 lb

## 2021-06-12 DIAGNOSIS — I10 Essential (primary) hypertension: Secondary | ICD-10-CM

## 2021-06-12 DIAGNOSIS — M25531 Pain in right wrist: Secondary | ICD-10-CM

## 2021-06-12 DIAGNOSIS — Z1329 Encounter for screening for other suspected endocrine disorder: Secondary | ICD-10-CM

## 2021-06-12 DIAGNOSIS — M109 Gout, unspecified: Secondary | ICD-10-CM

## 2021-06-12 DIAGNOSIS — Z6841 Body Mass Index (BMI) 40.0 and over, adult: Secondary | ICD-10-CM

## 2021-06-12 DIAGNOSIS — J069 Acute upper respiratory infection, unspecified: Secondary | ICD-10-CM

## 2021-06-12 DIAGNOSIS — T17308D Unspecified foreign body in larynx causing other injury, subsequent encounter: Secondary | ICD-10-CM

## 2021-06-12 DIAGNOSIS — F419 Anxiety disorder, unspecified: Secondary | ICD-10-CM | POA: Diagnosis not present

## 2021-06-12 DIAGNOSIS — F331 Major depressive disorder, recurrent, moderate: Secondary | ICD-10-CM

## 2021-06-12 DIAGNOSIS — Z131 Encounter for screening for diabetes mellitus: Secondary | ICD-10-CM

## 2021-06-12 DIAGNOSIS — R1012 Left upper quadrant pain: Secondary | ICD-10-CM

## 2021-06-12 DIAGNOSIS — R1013 Epigastric pain: Secondary | ICD-10-CM

## 2021-06-12 DIAGNOSIS — Z13220 Encounter for screening for lipoid disorders: Secondary | ICD-10-CM

## 2021-06-12 DIAGNOSIS — E559 Vitamin D deficiency, unspecified: Secondary | ICD-10-CM

## 2021-06-12 MED ORDER — WEGOVY 0.5 MG/0.5ML ~~LOC~~ SOAJ: 0.5000 mg | SUBCUTANEOUS | 0 refills | Status: DC

## 2021-06-12 MED ORDER — FLUOXETINE HCL 20 MG PO CAPS: 20.0000 mg | ORAL_CAPSULE | Freq: Every day | ORAL | 1 refills | Status: DC

## 2021-06-12 MED ORDER — DICLOFENAC SODIUM 1 % EX GEL: 4.0000 g | Freq: Four times a day (QID) | CUTANEOUS | 1 refills | Status: DC

## 2021-06-12 MED ORDER — DULOXETINE HCL 60 MG PO CPEP: 60.0000 mg | ORAL_CAPSULE | Freq: Every day | ORAL | 0 refills | Status: DC

## 2021-06-12 MED ORDER — LOSARTAN POTASSIUM-HCTZ 50-12.5 MG PO TABS: 1.0000 | ORAL_TABLET | Freq: Every day | ORAL | 0 refills | Status: DC

## 2021-06-12 MED ORDER — WEGOVY 0.25 MG/0.5ML ~~LOC~~ SOAJ: 0.2500 mg | SUBCUTANEOUS | 0 refills | Status: DC

## 2021-06-12 MED ORDER — DULOXETINE HCL 30 MG PO CPEP: 30.0000 mg | ORAL_CAPSULE | Freq: Every day | ORAL | 0 refills | Status: DC

## 2021-06-12 MED ORDER — FLUTICASONE PROPIONATE 50 MCG/ACT NA SUSP: 2.0000 | Freq: Every day | NASAL | 0 refills | Status: DC

## 2021-06-12 MED ORDER — LAMOTRIGINE 200 MG PO TABS: 200.0000 mg | ORAL_TABLET | Freq: Every day | ORAL | 1 refills | Status: DC
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
## 2021-06-12 NOTE — Patient Instructions (Signed)
Start flonase and Hyzaar and if congestion not better let me know.  Consider humidifer  Follow up with Dr. Darene Lamer for right wrist. Use voltaren gel up to 4 times a day.   Will make referral to GI for endoscopy choking and epigastric pain.  Mancel Parsons can make gastric symptoms worse. Consider holding on this until you see GI. If GI symptoms worsen I would stop until evaluated.   First month is .57m Second and third month is .54m

## 2021-06-12 NOTE — Telephone Encounter (Signed)
Patient called back and left a vm stating her insurance does not cover weight loss injections discussed today. Alternative?

## 2021-06-14 DIAGNOSIS — J069 Acute upper respiratory infection, unspecified: Secondary | ICD-10-CM | POA: Insufficient documentation

## 2021-06-17 NOTE — Telephone Encounter (Signed)
Plenity capsules 100 dollars a month.

## 2021-06-17 NOTE — Telephone Encounter (Signed)
Tried to call patient with no answer, no vm.

## 2021-06-18 NOTE — Telephone Encounter (Signed)
Tried to call again with no answer, no vm. Mychart message sent to patient.

## 2021-06-21 ENCOUNTER — Other Ambulatory Visit: Payer: Self-pay

## 2021-06-21 ENCOUNTER — Ambulatory Visit (INDEPENDENT_AMBULATORY_CARE_PROVIDER_SITE_OTHER): Payer: Commercial Managed Care - PPO | Admitting: Physician Assistant

## 2021-06-21 VITALS — BP 141/85 | HR 80 | Ht 68.0 in | Wt 313.0 lb

## 2021-06-21 DIAGNOSIS — M47817 Spondylosis without myelopathy or radiculopathy, lumbosacral region: Secondary | ICD-10-CM

## 2021-06-21 DIAGNOSIS — M797 Fibromyalgia: Secondary | ICD-10-CM

## 2021-06-21 DIAGNOSIS — M160 Bilateral primary osteoarthritis of hip: Secondary | ICD-10-CM

## 2021-06-21 DIAGNOSIS — G894 Chronic pain syndrome: Secondary | ICD-10-CM | POA: Diagnosis not present

## 2021-06-21 MED ORDER — HYDROCODONE-ACETAMINOPHEN 5-325 MG PO TABS: 1.0000 | ORAL_TABLET | Freq: Four times a day (QID) | ORAL | 0 refills | Status: DC | PRN

## 2021-06-21 MED ORDER — PREDNISONE 50 MG PO TABS: ORAL_TABLET | ORAL | 0 refills | Status: DC
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
## 2021-06-21 NOTE — Patient Instructions (Signed)
Degenerative Disk Disease Degenerative disk disease is a condition caused by changes that occur in the spinal disks as a person ages. Spinal disks are soft and compressible disks located between the bones of your spine (vertebrae). These disks act like shock absorbers. Degenerative disk disease can affect the whole spine. However, the neck and lower back are most often affected. Many changes can occur in the spinal disks with aging, such as: The spinal disks may dry and shrink. Small tears may occur in the tough, outer covering of the disk (annulus). The disk space may become smaller due to loss of water. Abnormal growths in the bone (spurs) may occur. This can put pressure on the nerve roots exiting the spinal canal, causing pain. The spinal canal may become narrowed. What are the causes? This condition may be caused by: Normal degeneration with age. Injuries. Certain activities and sports that cause damage. What increases the risk? The following factors may make you more likely to develop this condition: Being overweight. Having a family history of degenerative disk disease. Smoking and use of products that contain nicotine and tobacco. Sudden injury. Doing work that requires heavy lifting. What are the signs or symptoms? Symptoms of this condition include: Pain that varies in intensity. Some people have no pain, while others have severe pain. The location of the pain depends on the part of your backbone that is affected. You may have: Pain in your neck or arm if a disk in your neck area is affected. Pain in your back, buttocks, or legs if a disk in your lower back is affected. Pain that becomes worse while bending or reaching up, or with twisting movements. Pain that may start gradually and worsen as time passes. It may also start after a major or minor injury. Numbness or tingling in the arms or legs. How is this diagnosed? This condition may be diagnosed based on: Your symptoms and  medical history. A physical exam. Imaging tests, including: X-ray of the spine. CT scan. MRI. How is this treated? This condition may be treated with: Medicines. Injection of steroids into the back. Rehabilitation exercises. These activities aim to strengthen muscles in your back and abdomen to better support your spine. If treatments do not help to relieve your symptoms or you have severe pain, you may need surgery. Follow these instructions at home: Medicines Take over-the-counter and prescription medicines only as told by your health care provider. Ask your health care provider if the medicine prescribed to you: Requires you to avoid driving or using machinery. Can cause constipation. You may need to take these actions to prevent or treat constipation: Drink enough fluid to keep your urine pale yellow. Take over-the-counter or prescription medicines. Eat foods that are high in fiber, such as beans, whole grains, and fresh fruits and vegetables. Limit foods that are high in fat and processed sugars, such as fried or sweet foods. Activity Rest as told by your health care provider. Avoid sitting for a long time without moving. Get up to take short walks every 1-2 hours. This is important to improve blood flow and breathing. Ask for help if you feel weak or unsteady. Return to your normal activities as told by your health care provider. Ask your health care provider what activities are safe for you. Perform relaxation exercises as told by your health care provider. Maintain good posture. Do not lift anything that is heavier than 10 lb (4.5 kg), or the limit that you are told, until your health care  provider says that it is safe. Follow proper lifting and walking techniques as told by your health care provider. Managing pain, stiffness, and swelling   If directed, put ice on the painful area. Icing can help to relieve pain. To do this: Put ice in a plastic bag. Place a towel between  your skin and the bag. Leave the ice on for 20 minutes, 2-3 times a day. Remove the ice if your skin turns bright red. This is very important. If you cannot feel pain, heat, or cold, you have a greater risk of damage to the area. If directed, apply heat to the painful area as often as told by your health care provider. Heat can reduce the stiffness of your muscles. Use the heat source that your health care provider recommends, such as a moist heat pack or a heating pad. Place a towel between your skin and the heat source. Leave the heat on for 20-30 minutes. Remove the heat if your skin turns bright red. This is especially important if you are unable to feel pain, heat, or cold. You may have a greater risk of getting burned. General instructions Change your sitting, standing, and sleeping habits as told by your health care provider. Avoid sitting in the same position for long periods of time. Change positions frequently. Lose weight or maintain a healthy weight as told by your health care provider. Do not use any products that contain nicotine or tobacco, such as cigarettes, e-cigarettes, and chewing tobacco. If you need help quitting, ask your health care provider. Wear supportive footwear. Keep all follow-up visits. This is important. This may include visits for physical therapy. Contact a health care provider if you: Have pain that does not go away within 1-4 weeks. Lose your appetite. Lose weight without trying. Get help right away if you: Have severe pain. Notice weakness in your arms, hands, or legs. Begin to lose control of your bladder or bowel movements. Have fevers or night sweats. Summary Degenerative disk disease is a condition caused by changes that occur in the spinal disks as a person ages. This condition can affect the whole spine. However, the neck and lower back are most often affected. Take over-the-counter and prescription medicines only as told by your health care  provider. This information is not intended to replace advice given to you by your health care provider. Make sure you discuss any questions you have with your health care provider. Document Revised: 08/04/2019 Document Reviewed: 08/04/2019 Elsevier Patient Education  [place].

## 2021-06-24 ENCOUNTER — Telehealth: Payer: Self-pay

## 2021-06-24 NOTE — Telephone Encounter (Addendum)
Initiated Prior authorization [id number]) 0.25 MG/0.5ML SOAJ  Via: Covermymeds Case/Key:[id number] Status: denied as of 06/24/21 Reason:The denial was based on our criteria for Wegovy Inj 0.64m Notified Pt via: Mychart

## 2021-06-25 ENCOUNTER — Ambulatory Visit: Payer: Commercial Managed Care - PPO | Admitting: Sports Medicine

## 2021-06-25 NOTE — Telephone Encounter (Signed)
Only other injectable option is saxenda.

## 2021-07-09 ENCOUNTER — Encounter: Payer: Self-pay | Admitting: Physician Assistant

## 2021-07-09 ENCOUNTER — Ambulatory Visit (INDEPENDENT_AMBULATORY_CARE_PROVIDER_SITE_OTHER): Payer: Commercial Managed Care - PPO | Admitting: Physician Assistant

## 2021-07-09 ENCOUNTER — Other Ambulatory Visit: Payer: Self-pay

## 2021-07-09 ENCOUNTER — Ambulatory Visit (INDEPENDENT_AMBULATORY_CARE_PROVIDER_SITE_OTHER): Payer: Commercial Managed Care - PPO

## 2021-07-09 VITALS — BP 146/91 | HR 93 | Ht 68.0 in | Wt 315.0 lb

## 2021-07-09 DIAGNOSIS — K56609 Unspecified intestinal obstruction, unspecified as to partial versus complete obstruction: Secondary | ICD-10-CM

## 2021-07-09 DIAGNOSIS — R112 Nausea with vomiting, unspecified: Secondary | ICD-10-CM | POA: Diagnosis not present

## 2021-07-09 DIAGNOSIS — R198 Other specified symptoms and signs involving the digestive system and abdomen: Secondary | ICD-10-CM | POA: Diagnosis not present

## 2021-07-09 DIAGNOSIS — R110 Nausea: Secondary | ICD-10-CM

## 2021-07-09 DIAGNOSIS — R1010 Upper abdominal pain, unspecified: Secondary | ICD-10-CM

## 2021-07-09 DIAGNOSIS — K5900 Constipation, unspecified: Secondary | ICD-10-CM

## 2021-07-09 LAB — I-STAT CREATININE (MANUAL ENTRY): Creatinine, Ser: 0.7 (ref 0.50–1.10)

## 2021-07-09 MED ORDER — IOHEXOL 350 MG/ML SOLN
100.0000 mL | Freq: Once | INTRAVENOUS | Status: AC | PRN
  Administered 2021-07-09: 100 mL via INTRAVENOUS

## 2021-07-09 NOTE — Progress Notes (Signed)
Left adrenal adenoma is a little bigger at 2cm.  ?No causes of pain identified. Normal CT.  ?Continue to follow up with GI.  ?With tenderness palpated could be some stool trapped or muscle strain.  ?Ok to try miralax 1 capful bid until stooling regularly ?Ice packs on abdomen
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
## 2021-07-10 ENCOUNTER — Telehealth: Payer: Self-pay | Admitting: Physician Assistant

## 2021-07-10 NOTE — Telephone Encounter (Signed)
Have tried to call patient several times to give CT results, no answer on either number listed, no VM set up. Need good contact number.  ?

## 2021-08-15 ENCOUNTER — Ambulatory Visit (INDEPENDENT_AMBULATORY_CARE_PROVIDER_SITE_OTHER): Payer: Commercial Managed Care - PPO

## 2021-08-15 ENCOUNTER — Ambulatory Visit (INDEPENDENT_AMBULATORY_CARE_PROVIDER_SITE_OTHER): Payer: Commercial Managed Care - PPO | Admitting: Sports Medicine

## 2021-08-15 DIAGNOSIS — M25571 Pain in right ankle and joints of right foot: Secondary | ICD-10-CM

## 2021-08-15 DIAGNOSIS — M25572 Pain in left ankle and joints of left foot: Secondary | ICD-10-CM

## 2021-08-15 MED ORDER — HYDROCODONE-ACETAMINOPHEN 10-325 MG PO TABS: 1.0000 | ORAL_TABLET | Freq: Three times a day (TID) | ORAL | 0 refills | Status: DC | PRN

## 2021-08-15 NOTE — Assessment & Plan Note (Signed)
Pleasant 46 year old female, recent fall with inversion injuries of both ankles, pain is medially on both sides, not so much tender over the medial malleolus but more over the tibialis posterior and flexor digitorum longus. ?Right side extremely painful, adding a boot, left side lesser so, adding an ASO, adding hydrocodone for pain, bilateral x-rays, limited weightbearing, home physical therapy. ?Return to see me in 4 weeks. ?

## 2021-08-15 NOTE — Progress Notes (Signed)
? ? ?  Procedures performed today:   ? ?None. ? ?Independent interpretation of notes and tests performed by another provider:  ? ?None. ? ?Brief History, Exam, Impression, and Recommendations:   ? ?Bilateral ankle pain ?Pleasant 46 year old female, recent fall with inversion injuries of both ankles, pain is medially on both sides, not so much tender over the medial malleolus but more over the tibialis posterior and flexor digitorum longus. ?Right side extremely painful, adding a boot, left side lesser so, adding an ASO, adding hydrocodone for pain, bilateral x-rays, limited weightbearing, home physical therapy. ?Return to see me in 4 weeks. ? ? ?___________________________________________ ?Gwen Her. Dianah Field, M.D., ABFM., CAQSM. ?Primary Care and Sports Medicine ?[place] ? ?Adjunct Instructor of Family Medicine  ?[employer] ?

## 2021-08-26 ENCOUNTER — Telehealth: Payer: Self-pay | Admitting: Physician Assistant

## 2021-08-26 DIAGNOSIS — R7301 Impaired fasting glucose: Secondary | ICD-10-CM

## 2021-08-26 NOTE — Telephone Encounter (Signed)
Labs have been ordered on patient from February, she hasn't had done yet.  ? ?Kathryn Cline - did you want her to also have an A1C or have CMP first to see what glucose is?  ?

## 2021-08-26 NOTE — Telephone Encounter (Signed)
Patient called and stated she went to see her gastro doctor & was advised to contact her PCP to get a sugar test. Will patient need an appointment or can the test just be order. Please advise. ?

## 2021-08-26 NOTE — Telephone Encounter (Signed)
Ok to add A1C add dx code elevated random glucose.

## 2021-08-27 NOTE — Telephone Encounter (Signed)
Called patient at 11:32 to let her know Labs have been orders but no answer & unable to leave a vm-gh ?

## 2021-08-27 NOTE — Telephone Encounter (Signed)
A1C ordered. Patient also has labs ordered from February and did not have these done yet. Please have patient come by to get all labs done. Thanks! ?

## 2021-09-03 LAB — HEMOGLOBIN A1C
Hgb A1c MFr Bld: 5.3 % of total Hgb (ref <5.7)
Mean Plasma Glucose: 105 mg/dL
eAG (mmol/L): 5.8 mmol/L

## 2021-09-03 NOTE — Progress Notes (Signed)
A1C is 5.3 and in normal range. You are not a diabetic.

## 2021-09-06 ENCOUNTER — Other Ambulatory Visit: Payer: Self-pay | Admitting: Neurology

## 2021-09-06 DIAGNOSIS — T17308A Unspecified foreign body in larynx causing other injury, initial encounter: Secondary | ICD-10-CM

## 2021-09-06 DIAGNOSIS — R1012 Left upper quadrant pain: Secondary | ICD-10-CM

## 2021-09-06 DIAGNOSIS — R140 Abdominal distension (gaseous): Secondary | ICD-10-CM

## 2021-09-06 MED ORDER — PANTOPRAZOLE SODIUM 40 MG PO TBEC: 40.0000 mg | DELAYED_RELEASE_TABLET | Freq: Every day | ORAL | 1 refills | Status: DC

## 2021-09-10 ENCOUNTER — Encounter: Payer: Self-pay | Admitting: Physician Assistant

## 2021-09-10 ENCOUNTER — Ambulatory Visit (INDEPENDENT_AMBULATORY_CARE_PROVIDER_SITE_OTHER): Payer: Commercial Managed Care - PPO | Admitting: Physician Assistant

## 2021-09-10 VITALS — BP 137/82 | HR 100 | Ht 68.0 in | Wt 317.0 lb

## 2021-09-10 DIAGNOSIS — F419 Anxiety disorder, unspecified: Secondary | ICD-10-CM

## 2021-09-10 DIAGNOSIS — T887XXA Unspecified adverse effect of drug or medicament, initial encounter: Secondary | ICD-10-CM | POA: Diagnosis not present

## 2021-09-10 DIAGNOSIS — I10 Essential (primary) hypertension: Secondary | ICD-10-CM

## 2021-09-10 DIAGNOSIS — F430 Acute stress reaction: Secondary | ICD-10-CM | POA: Diagnosis not present

## 2021-09-10 DIAGNOSIS — M797 Fibromyalgia: Secondary | ICD-10-CM

## 2021-09-10 MED ORDER — LORAZEPAM 0.5 MG PO TABS: 0.5000 mg | ORAL_TABLET | Freq: Three times a day (TID) | ORAL | 1 refills | Status: DC | PRN

## 2021-09-10 MED ORDER — LOSARTAN POTASSIUM-HCTZ 50-12.5 MG PO TABS: 1.0000 | ORAL_TABLET | Freq: Every day | ORAL | 1 refills | Status: DC

## 2021-09-10 MED ORDER — DULOXETINE HCL 60 MG PO CPEP: 60.0000 mg | ORAL_CAPSULE | Freq: Two times a day (BID) | ORAL | 0 refills | Status: DC

## 2021-09-10 NOTE — Patient Instructions (Signed)
Stop reglan. ?Increase cymbalta to 83m twice a day ?Get labs ?Ativan as needed ?
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
## 2021-09-12 ENCOUNTER — Ambulatory Visit (INDEPENDENT_AMBULATORY_CARE_PROVIDER_SITE_OTHER): Payer: Commercial Managed Care - PPO

## 2021-09-12 ENCOUNTER — Ambulatory Visit (INDEPENDENT_AMBULATORY_CARE_PROVIDER_SITE_OTHER): Payer: Commercial Managed Care - PPO | Admitting: Sports Medicine

## 2021-09-12 DIAGNOSIS — L989 Disorder of the skin and subcutaneous tissue, unspecified: Secondary | ICD-10-CM

## 2021-09-12 DIAGNOSIS — D170 Benign lipomatous neoplasm of skin and subcutaneous tissue of head, face and neck: Secondary | ICD-10-CM

## 2021-09-12 DIAGNOSIS — M25572 Pain in left ankle and joints of left foot: Secondary | ICD-10-CM

## 2021-09-12 DIAGNOSIS — M25571 Pain in right ankle and joints of right foot: Secondary | ICD-10-CM

## 2021-09-12 NOTE — Assessment & Plan Note (Signed)
Return in a 30-minute slot for excision. ?

## 2021-09-12 NOTE — Assessment & Plan Note (Signed)
Large lipoma posterior neck, I am not highly comfortable removing this with its location, we will watch this for now. ?She was having some pain in the neck radiation down the left arm which I do not think is coming from the lipoma anyway. ?

## 2021-09-12 NOTE — Progress Notes (Signed)
? ? ?  Procedures performed today:   ? ?Procedure: Real-time Ultrasound Guided injection of the right tibialis posterior tendon sheath ?Device: Samsung HS60  ?Verbal informed consent obtained.  ?Time-out conducted.  ?Noted no overlying erythema, induration, or other signs of local infection.  ?Skin prepped in a sterile fashion.  ?Local anesthesia: Topical Ethyl chloride.  ?With sterile technique and under real time ultrasound guidance: Noted tendon sheath effusion, 1 cc lidocaine, 1 cc bupivacaine, 1 cc kenalog 40 injected easily. ?Completed without difficulty  ?Advised to call if fevers/chills, erythema, induration, drainage, or persistent bleeding.  ?Images permanently stored and available for review in PACS.  ?Impression: Technically successful ultrasound guided injection. ? ?Independent interpretation of notes and tests performed by another provider:  ? ?None. ? ?Brief History, Exam, Impression, and Recommendations:   ? ?Bilateral ankle pain ?E returns, she is a very pleasant 46 year old female, she has pain at both ankles, at the last visit we treated her conservatively, she had some severe pain on the right so we added a boot, left side is doing better today with just an ASO. ?Unfortunately with severe pain on the right, tenderness over the tibialis posterior and at the posterior distal tibial shaft we did an ultrasound, ultrasound showed a significant tibialis posterior tendon sheath effusion, this was injected today. ?She will do a boot for a week to protect the tendon, and return to see me in a month, if persistent pain we will MRI her tib-fib to evaluate for concurrent stress fracture. ? ?Lipoma of neck ?Large lipoma posterior neck, I am not highly comfortable removing this with its location, we will watch this for now. ?She was having some pain in the neck radiation down the left arm which I do not think is coming from the lipoma anyway. ? ?Skin lesion of right shoulder ?Return in a 30-minute slot for  excision. ? ?Chronic process with exacerbation and pharmacologic intervention ? ?___________________________________________ ?Gwen Her. Dianah Field, M.D., ABFM., CAQSM. ?Primary Care and Sports Medicine ?[place] ? ?Adjunct Instructor of Family Medicine  ?[employer] ?

## 2021-09-12 NOTE — Assessment & Plan Note (Signed)
Kathryn Cline returns, she is a very pleasant 46 year old female, she has pain at both ankles, at the last visit we treated her conservatively, she had some severe pain on the right so we added a boot, left side is doing better today with just an ASO. ?Unfortunately with severe pain on the right, tenderness over the tibialis posterior and at the posterior distal tibial shaft we did an ultrasound, ultrasound showed a significant tibialis posterior tendon sheath effusion, this was injected today. ?She will do a boot for a week to protect the tendon, and return to see me in a month, if persistent pain we will MRI her tib-fib to evaluate for concurrent stress fracture. ?

## 2021-09-13 DIAGNOSIS — F430 Acute stress reaction: Secondary | ICD-10-CM | POA: Insufficient documentation

## 2021-09-19 ENCOUNTER — Ambulatory Visit (INDEPENDENT_AMBULATORY_CARE_PROVIDER_SITE_OTHER): Payer: Commercial Managed Care - PPO | Admitting: Sports Medicine

## 2021-09-19 ENCOUNTER — Other Ambulatory Visit: Payer: Self-pay | Admitting: Sports Medicine

## 2021-09-19 ENCOUNTER — Ambulatory Visit: Payer: Commercial Managed Care - PPO | Admitting: Sports Medicine

## 2021-09-19 DIAGNOSIS — M25571 Pain in right ankle and joints of right foot: Secondary | ICD-10-CM | POA: Diagnosis not present

## 2021-09-19 DIAGNOSIS — M25572 Pain in left ankle and joints of left foot: Secondary | ICD-10-CM

## 2021-09-19 DIAGNOSIS — L989 Disorder of the skin and subcutaneous tissue, unspecified: Secondary | ICD-10-CM | POA: Diagnosis not present

## 2021-09-19 NOTE — Progress Notes (Signed)
    Procedures performed today:    Procedure:  Excision of right shoulder 1 cm skin lesion Risks, benefits, and alternatives explained and consent obtained. Time out conducted. Surface prepped with alcohol. 3cc lidocaine without epinephine infiltrated in a field block. Adequate anesthesia ensured. Area prepped and draped in a sterile fashion. Excision performed with: Using a 6 mm punch I took a full-thickness biopsy, I then closed the incision with a 4-0 horizontal mattress Ethilon suture. Hemostasis achieved. Pt stable.  Independent interpretation of notes and tests performed by another provider:   None.  Brief History, Exam, Impression, and Recommendations:    Bilateral ankle pain Kathryn Cline, Kathryn Cline is a very pleasant 46 year old female, bilateral ankle pain, left side improved, right side clinically resemble tibialis posterior tendinitis, ultrasound showed significant tendon sheath effusion, injected and placed in a boot to protect the tendon, he Cline today doing a lot better, adding tibialis posterior conditioning, and if persistent discomfort after another month we will do an MRI.  Skin lesion of right shoulder Excisional biopsy skin lesion right shoulder, primary closure with a horizontal mattress suture, return in 10 days for suture removal.    ___________________________________________ Gwen Her. Dianah Field, M.D., ABFM., CAQSM. Primary Care and [place] Instructor of [place] [employer] [facility] [employer]

## 2021-09-19 NOTE — Assessment & Plan Note (Signed)
Gissella returns, she is a very pleasant 46 year old female, bilateral ankle pain, left side improved, right side clinically resemble tibialis posterior tendinitis, ultrasound showed significant tendon sheath effusion, injected and placed in a boot to protect the tendon, he returns today doing a lot better, adding tibialis posterior conditioning, and if persistent discomfort after another month we will do an MRI.

## 2021-09-19 NOTE — Assessment & Plan Note (Signed)
Excisional biopsy skin lesion right shoulder, primary closure with a horizontal mattress suture, return in 10 days for suture removal.

## 2021-09-19 NOTE — Addendum Note (Signed)
Addended by: Dema Severin on: 09/19/2021 10:47 AM   Modules accepted: Orders

## 2021-09-25 ENCOUNTER — Encounter: Payer: Self-pay | Admitting: Neurology

## 2021-10-03 ENCOUNTER — Ambulatory Visit (INDEPENDENT_AMBULATORY_CARE_PROVIDER_SITE_OTHER): Payer: Commercial Managed Care - PPO | Admitting: Sports Medicine

## 2021-10-03 DIAGNOSIS — R55 Syncope and collapse: Secondary | ICD-10-CM | POA: Diagnosis not present

## 2021-10-03 DIAGNOSIS — D239 Other benign neoplasm of skin, unspecified: Secondary | ICD-10-CM

## 2021-10-03 DIAGNOSIS — R42 Dizziness and giddiness: Secondary | ICD-10-CM | POA: Diagnosis not present

## 2021-10-03 MED ORDER — BLOOD GLUCOSE MONITOR KIT: PACK | 0 refills | Status: DC

## 2021-10-03 MED ORDER — BLOOD PRESSURE CUFF MISC: 0 refills | Status: DC

## 2021-10-03 NOTE — Progress Notes (Signed)
    Procedures performed today:    None.  Independent interpretation of notes and tests performed by another provider:   None.  Brief History, Exam, Impression, and Recommendations:    Dermatofibroma Kathryn Cline returns, I have removed a dermatofibroma, sutures removed today, incision is clean, dry, intact, she has had one in her leg and her arm now.  Postural dizziness with presyncope Kathryn Cline reports episodes of intermittent dizziness, weakness, sweating, nausea, these episodes typically pass on their own. She denies any associated chest pain or shortness of breath and they are typically at rest or when getting out of the shower. I did explain to her that these sound like vagal episodes but the only way to be sure was to check her blood sugar at the time of the episode as well as get a blood pressure and pulse rate. A low blood pressure and pulse rate would confirm vasovagal episodes. I will send in a prescription for a glucometer and a blood pressure cuff, she can keep these with her, and she knows to keep further evaluation of this with her primary care provider.    ___________________________________________ Gwen Her. Dianah Field, M.D., ABFM., CAQSM. Primary Care and [place] Instructor of [employer] [facility] [facility] [employer]

## 2021-10-03 NOTE — Assessment & Plan Note (Signed)
Kathryn Cline reports episodes of intermittent dizziness, weakness, sweating, nausea, these episodes typically pass on their own. She denies any associated chest pain or shortness of breath and they are typically at rest or when getting out of the shower. I did explain to her that these sound like vagal episodes but the only way to be sure was to check her blood sugar at the time of the episode as well as get a blood pressure and pulse rate. A low blood pressure and pulse rate would confirm vasovagal episodes. I will send in a prescription for a glucometer and a blood pressure cuff, she can keep these with her, and she knows to keep further evaluation of this with her primary care provider.

## 2021-10-03 NOTE — Assessment & Plan Note (Signed)
Jalila returns, I have removed a dermatofibroma, sutures removed today, incision is clean, dry, intact, she has had one in her leg and her arm now.

## 2021-10-08 ENCOUNTER — Encounter: Payer: Self-pay | Admitting: Physician Assistant

## 2021-10-08 ENCOUNTER — Ambulatory Visit (INDEPENDENT_AMBULATORY_CARE_PROVIDER_SITE_OTHER): Payer: Commercial Managed Care - PPO | Admitting: Physician Assistant

## 2021-10-08 VITALS — BP 150/92 | HR 92 | Ht 68.0 in | Wt 317.0 lb

## 2021-10-08 DIAGNOSIS — E781 Pure hyperglyceridemia: Secondary | ICD-10-CM

## 2021-10-08 DIAGNOSIS — M797 Fibromyalgia: Secondary | ICD-10-CM

## 2021-10-08 DIAGNOSIS — M1A9XX0 Chronic gout, unspecified, without tophus (tophi): Secondary | ICD-10-CM | POA: Diagnosis not present

## 2021-10-08 DIAGNOSIS — Z6841 Body Mass Index (BMI) 40.0 and over, adult: Secondary | ICD-10-CM

## 2021-10-08 DIAGNOSIS — M109 Gout, unspecified: Secondary | ICD-10-CM | POA: Insufficient documentation

## 2021-10-08 LAB — COMPLETE METABOLIC PANEL WITH GFR
AG Ratio: 1.9 (calc) (ref 1.0–2.5)
ALT: 40 U/L — ABNORMAL HIGH (ref 6–29)
AST: 36 U/L — ABNORMAL HIGH (ref 10–35)
Albumin: 4.3 g/dL (ref 3.6–5.1)
Alkaline phosphatase (APISO): 51 U/L (ref 31–125)
BUN: 13 mg/dL (ref 7–25)
CO2: 31 mmol/L (ref 20–32)
Calcium: 9.4 mg/dL (ref 8.6–10.2)
Chloride: 98 mmol/L (ref 98–110)
Creat: 0.73 mg/dL (ref 0.50–0.99)
Globulin: 2.3 g/dL (calc) (ref 1.9–3.7)
Glucose, Bld: 88 mg/dL (ref 65–99)
Potassium: 4.4 mmol/L (ref 3.5–5.3)
Sodium: 139 mmol/L (ref 135–146)
Total Bilirubin: 0.6 mg/dL (ref 0.2–1.2)
Total Protein: 6.6 g/dL (ref 6.1–8.1)
eGFR: 102 mL/min/{1.73_m2} (ref >=60)

## 2021-10-08 LAB — VITAMIN D 25 HYDROXY (VIT D DEFICIENCY, FRACTURES): Vit D, 25-Hydroxy: 48 ng/mL (ref 30–100)

## 2021-10-08 LAB — LIPID PANEL W/REFLEX DIRECT LDL
Cholesterol: 154 mg/dL (ref <200)
HDL: 31 mg/dL — ABNORMAL LOW (ref >=50)
LDL Cholesterol (Calc): 86 mg/dL (calc)
Non-HDL Cholesterol (Calc): 123 mg/dL (calc) (ref <130)
Total CHOL/HDL Ratio: 5 (calc) — ABNORMAL HIGH (ref <5.0)
Triglycerides: 310 mg/dL — ABNORMAL HIGH (ref <150)

## 2021-10-08 LAB — CBC WITH DIFFERENTIAL/PLATELET
Absolute Monocytes: 401 cells/uL (ref 200–950)
Basophils Absolute: 61 cells/uL (ref 0–200)
Basophils Relative: 0.9 %
Eosinophils Absolute: 231 cells/uL (ref 15–500)
Eosinophils Relative: 3.4 %
HCT: 38.1 % (ref 35.0–45.0)
Hemoglobin: 12.8 g/dL (ref 11.7–15.5)
Lymphs Abs: 1761 cells/uL (ref 850–3900)
MCH: 30 pg (ref 27.0–33.0)
MCHC: 33.6 g/dL (ref 32.0–36.0)
MCV: 89.2 fL (ref 80.0–100.0)
MPV: 10.4 fL (ref 7.5–12.5)
Monocytes Relative: 5.9 %
Neutro Abs: 4345 cells/uL (ref 1500–7800)
Neutrophils Relative %: 63.9 %
Platelets: 272 10*3/uL (ref 140–400)
RBC: 4.27 10*6/uL (ref 3.80–5.10)
RDW: 14.1 % (ref 11.0–15.0)
Total Lymphocyte: 25.9 %
WBC: 6.8 10*3/uL (ref 3.8–10.8)

## 2021-10-08 LAB — TSH: TSH: 2.66 mIU/L

## 2021-10-08 LAB — URIC ACID: Uric Acid, Serum: 8.9 mg/dL — ABNORMAL HIGH (ref 2.5–7.0)

## 2021-10-08 MED ORDER — FENOFIBRATE 145 MG PO TABS: 145.0000 mg | ORAL_TABLET | Freq: Every day | ORAL | 3 refills | Status: DC

## 2021-10-08 MED ORDER — COLCHICINE 0.6 MG PO TABS: 0.6000 mg | ORAL_TABLET | Freq: Every day | ORAL | 0 refills | Status: DC

## 2021-10-08 MED ORDER — AMBULATORY NON FORMULARY MEDICATION: 1 refills | Status: DC

## 2021-10-08 MED ORDER — AMBULATORY NON FORMULARY MEDICATION: 0 refills | Status: DC

## 2021-10-08 MED ORDER — DULOXETINE HCL 60 MG PO CPEP: 60.0000 mg | ORAL_CAPSULE | Freq: Two times a day (BID) | ORAL | 1 refills | Status: DC

## 2021-10-08 MED ORDER — ALLOPURINOL 100 MG PO TABS: 100.0000 mg | ORAL_TABLET | Freq: Every day | ORAL | 1 refills | Status: DC

## 2021-10-08 NOTE — Progress Notes (Unsigned)
   Established Patient Office Visit  Subjective   Patient ID: Naryah Clenney, female    DOB: 1975-11-27  Age: 46 y.o. MRN: [id number]  Chief Complaint  Patient presents with   Follow-up    HPI Stop reglan  Cymbalta bid  {History (Optional):23778}  ROS    Objective:     BP (!) 150/92   Pulse 92   Ht 5' 8"  (1.727 m)   Wt (!) 317 lb (143.8 kg)   LMP  (LMP Unknown)   SpO2 99%   BMI 48.20 kg/m  BP Readings from Last 3 Encounters:  10/08/21 (!) 150/92  09/10/21 137/82  07/09/21 (!) 146/91   Wt Readings from Last 3 Encounters:  10/08/21 (!) 317 lb (143.8 kg)  09/10/21 (!) 317 lb (143.8 kg)  07/09/21 (!) 315 lb (142.9 kg)      Physical Exam   No results found for any visits on 10/08/21.  {Labs (Optional):23779}  The 10-year ASCVD risk score (Arnett DK, et al., 2019) is: 8.4%    Assessment & Plan:   Problem List Items Addressed This Visit   None   No follow-ups on file.    [name], PA-C

## 2021-10-08 NOTE — Patient Instructions (Signed)
Low-Purine Eating Plan A low-purine eating plan involves making food choices to limit your purine intake. Purine is a kind of uric acid. Too much uric acid in your blood can cause certain conditions, such as gout and kidney stones. Eating a low-purine diet may help control these conditions. What are tips for following this plan? Shopping Avoid buying products that contain high-fructose corn syrup. Check for this on food labels. It is commonly found in many processed foods and soft drinks. Be sure to check for it in baked goods such as cookies, canned fruits, and cereals and cereal bars. Avoid buying veal, chicken breast with skin, lamb, and organ meats such as liver. These types of meats tend to have the highest purine content. Choose dairy products. These may lower uric acid levels. Avoid certain types of fish. Not all fish and seafood have high purine content. Examples with high purine content include anchovies, trout, tuna, sardines, and salmon. Avoid buying beverages that contain alcohol, particularly beer and hard liquor. Alcohol can affect the way your body gets rid of uric acid. Meal planning  Learn which foods do or do not affect you. If you find out that a food tends to cause your gout symptoms to flare up, avoid eating that food. You can enjoy foods that do not cause problems. If you have any questions about a food item, talk with your dietitian or health care provider. Reduce the overall amount of meat in your diet. When you do eat meat, choose ones with lower purine content. Include plenty of fruits and vegetables. Although some vegetables may have a high purine content--such as asparagus, mushrooms, spinach, or cauliflower--it has been shown that these do not contribute to uric acid blood levels as much. Consume at least 1 dairy serving a day. This has been shown to decrease uric acid levels. General information If you drink alcohol: Limit how much you have to: 0-1 drink a day for  women who are not pregnant. 0-2 drinks a day for men. Know how much alcohol is in a drink. In the [place] one drink equals one 12 oz bottle of beer (355 mL), one 5 oz glass of wine (148 mL), or one 1 oz glass of hard liquor (44 mL). Drink plenty of water. Try to drink enough to keep your urine pale yellow. Fluids can help remove uric acid from your body. Work with your health care provider and dietitian to develop a plan to achieve or maintain a healthy weight. Losing weight may help reduce uric acid in your blood. What foods are recommended? The following are some types of foods that are good choices when limiting purine intake: Fresh or frozen fruits and vegetables. Whole grains, breads, cereals, and pasta. Rice. Beans, peas, legumes. Nuts and seeds. Dairy products. Fats and oils. The items listed above may not be a complete list. Talk with a dietitian about what dietary choices are best for you. What foods are not recommended? Limit your intake of foods high in purines, including: Beer and other alcohol. Meat-based gravy or sauce. Canned or fresh fish, such as: Anchovies, sardines, herring, salmon, and tuna. Mussels and scallops. Codfish, trout, and haddock. Bacon, veal, chicken breast with skin, and lamb. Organ meats, such as: Liver or kidney. Tripe. Sweetbreads (thymus gland or pancreas). Wild Clinical biochemist. Yeast or yeast extract supplements. Drinks sweetened with high-fructose corn syrup, such as soda. Processed foods made with high-fructose corn syrup. The items listed above may not be a complete list of foods  and beverages you should limit. Contact a dietitian for more information. Summary Eating a low-purine diet may help control conditions caused by too much uric acid in the body, such as gout or kidney stones. Choose low-purine foods, limit alcohol, and limit high-fructose corn syrup. You will learn over time which foods do or do not affect you. If you find out that a  food tends to cause your gout symptoms to flare up, avoid eating that food. This information is not intended to replace advice given to you by your health care provider. Make sure you discuss any questions you have with your health care provider. Document Revised: 04/04/2021 Document Reviewed: 04/04/2021 Elsevier Patient Education  [place].

## 2021-10-08 NOTE — Progress Notes (Signed)
Liver enzymes up.  Uric acid up.  Thyroid looks good.  Vitamin D looks great.  LDL looks good.  TG still elevated. Will discuss at [place] today.

## 2021-10-09 ENCOUNTER — Encounter: Payer: Self-pay | Admitting: Physician Assistant

## 2021-10-15 ENCOUNTER — Other Ambulatory Visit (HOSPITAL_COMMUNITY): Payer: Self-pay | Admitting: Medical

## 2021-10-15 DIAGNOSIS — F419 Anxiety disorder, unspecified: Secondary | ICD-10-CM

## 2021-10-15 DIAGNOSIS — F331 Major depressive disorder, recurrent, moderate: Secondary | ICD-10-CM

## 2021-10-17 NOTE — Telephone Encounter (Signed)
Pt needs appt

## 2021-10-22 ENCOUNTER — Ambulatory Visit: Payer: Commercial Managed Care - PPO | Admitting: Sports Medicine

## 2021-10-22 ENCOUNTER — Encounter: Payer: Self-pay | Admitting: Physician Assistant

## 2021-10-22 ENCOUNTER — Other Ambulatory Visit: Payer: Self-pay | Admitting: Physician Assistant

## 2021-10-22 DIAGNOSIS — R600 Localized edema: Secondary | ICD-10-CM

## 2021-12-03 ENCOUNTER — Other Ambulatory Visit: Payer: Self-pay | Admitting: Physician Assistant

## 2021-12-03 DIAGNOSIS — F331 Major depressive disorder, recurrent, moderate: Secondary | ICD-10-CM

## 2021-12-03 DIAGNOSIS — F419 Anxiety disorder, unspecified: Secondary | ICD-10-CM

## 2021-12-20 ENCOUNTER — Ambulatory Visit: Payer: Commercial Managed Care - PPO | Admitting: Physician Assistant

## 2021-12-20 ENCOUNTER — Ambulatory Visit (INDEPENDENT_AMBULATORY_CARE_PROVIDER_SITE_OTHER): Payer: Commercial Managed Care - PPO | Admitting: Physician Assistant

## 2021-12-20 ENCOUNTER — Ambulatory Visit (INDEPENDENT_AMBULATORY_CARE_PROVIDER_SITE_OTHER): Payer: Commercial Managed Care - PPO

## 2021-12-20 VITALS — BP 133/90 | HR 94

## 2021-12-20 DIAGNOSIS — W19XXXA Unspecified fall, initial encounter: Secondary | ICD-10-CM

## 2021-12-20 DIAGNOSIS — M79675 Pain in left toe(s): Secondary | ICD-10-CM

## 2021-12-20 DIAGNOSIS — T148XXA Other injury of unspecified body region, initial encounter: Secondary | ICD-10-CM | POA: Diagnosis not present

## 2021-12-20 DIAGNOSIS — M25562 Pain in left knee: Secondary | ICD-10-CM | POA: Diagnosis not present

## 2021-12-20 DIAGNOSIS — L089 Local infection of the skin and subcutaneous tissue, unspecified: Secondary | ICD-10-CM

## 2021-12-20 MED ORDER — FLUCONAZOLE 150 MG PO TABS: 150.0000 mg | ORAL_TABLET | Freq: Once | ORAL | 0 refills | Status: AC

## 2021-12-20 MED ORDER — CEPHALEXIN 500 MG PO CAPS: 500.0000 mg | ORAL_CAPSULE | Freq: Two times a day (BID) | ORAL | 0 refills | Status: DC
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
## 2021-12-20 NOTE — Progress Notes (Signed)
No fracture of left knee.

## 2021-12-20 NOTE — Progress Notes (Signed)
You do have a chip fracture over great toe at base of first phalanx. You need to be in post op shoe for the next 4 weeks. Do you have one at home or do you need to come back and be fitting for one. Follow up in 4 weeks.

## 2021-12-23 ENCOUNTER — Encounter: Payer: Self-pay | Admitting: Physician Assistant

## 2021-12-30 ENCOUNTER — Other Ambulatory Visit: Payer: Self-pay | Admitting: Physician Assistant

## 2021-12-30 DIAGNOSIS — F331 Major depressive disorder, recurrent, moderate: Secondary | ICD-10-CM

## 2022-01-02 ENCOUNTER — Ambulatory Visit (INDEPENDENT_AMBULATORY_CARE_PROVIDER_SITE_OTHER): Payer: Commercial Managed Care - PPO

## 2022-01-02 DIAGNOSIS — Z1231 Encounter for screening mammogram for malignant neoplasm of breast: Secondary | ICD-10-CM

## 2022-01-03 NOTE — Progress Notes (Signed)
Normal mammogram. Follow up in 1 year.

## 2022-01-08 ENCOUNTER — Encounter: Payer: Self-pay | Admitting: Physician Assistant

## 2022-01-08 ENCOUNTER — Ambulatory Visit (INDEPENDENT_AMBULATORY_CARE_PROVIDER_SITE_OTHER): Payer: Commercial Managed Care - PPO | Admitting: Physician Assistant

## 2022-01-08 VITALS — BP 143/70 | HR 92 | Wt 314.0 lb

## 2022-01-08 DIAGNOSIS — Z6841 Body Mass Index (BMI) 40.0 and over, adult: Secondary | ICD-10-CM

## 2022-01-08 DIAGNOSIS — K2100 Gastro-esophageal reflux disease with esophagitis, without bleeding: Secondary | ICD-10-CM | POA: Diagnosis not present

## 2022-01-08 MED ORDER — OMEPRAZOLE 40 MG PO CPDR: 40.0000 mg | DELAYED_RELEASE_CAPSULE | Freq: Every day | ORAL | 3 refills | Status: DC

## 2022-01-08 MED ORDER — SAXENDA 18 MG/3ML ~~LOC~~ SOPN: 3.0000 mg | PEN_INJECTOR | Freq: Every day | SUBCUTANEOUS | 11 refills | Status: DC

## 2022-01-08 MED ORDER — PHENTERMINE HCL 15 MG PO CAPS: 15.0000 mg | ORAL_CAPSULE | ORAL | 0 refills | Status: DC

## 2022-01-08 MED ORDER — TOPIRAMATE 25 MG PO TABS: 25.0000 mg | ORAL_TABLET | Freq: Two times a day (BID) | ORAL | 0 refills | Status: DC

## 2022-01-08 MED ORDER — INSULIN PEN NEEDLE 31G X 6 MM MISC: 0 refills | Status: DC
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
## 2022-01-13 ENCOUNTER — Ambulatory Visit (INDEPENDENT_AMBULATORY_CARE_PROVIDER_SITE_OTHER): Payer: Commercial Managed Care - PPO | Admitting: Family Medicine

## 2022-01-13 ENCOUNTER — Encounter: Payer: Self-pay | Admitting: Family Medicine

## 2022-01-13 VITALS — BP 130/80 | HR 96 | Temp 98.8°F | Ht 68.0 in | Wt 317.0 lb

## 2022-01-13 DIAGNOSIS — U071 COVID-19: Secondary | ICD-10-CM | POA: Diagnosis not present

## 2022-01-13 DIAGNOSIS — Z20822 Contact with and (suspected) exposure to covid-19: Secondary | ICD-10-CM

## 2022-01-13 LAB — POCT INFLUENZA A/B
Influenza A, POC: NEGATIVE
Influenza B, POC: NEGATIVE

## 2022-01-13 LAB — POC COVID19 BINAXNOW: SARS Coronavirus 2 Ag: POSITIVE — AB

## 2022-01-13 MED ORDER — MOLNUPIRAVIR EUA 200MG CAPSULE: 4.0000 | ORAL_CAPSULE | Freq: Two times a day (BID) | ORAL | 0 refills | Status: AC

## 2022-01-13 NOTE — Assessment & Plan Note (Addendum)
She does have some interactions between Paxlovid with some of her psychiatric medications.  She does not want to discontinue her psychiatric medications so we will do molnupiravir instead of Paxlovid.  Encouraged increase fluid intake.  She may use over-the-counter medications as needed for cough, fever or aches.  Red flags discussed and instructed to contact the clinic if having worsening symptoms.
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
## 2022-01-13 NOTE — Patient Instructions (Signed)
Infection Prevention in the Home If you have an infection, may have been exposed to an infection, or are taking care of someone who has an infection, it is important to know how to keep the infection from spreading. Follow your health care provider's instructions and use these guidelines to help stop the spread of infection. How infections are spread In order for an infection to spread, the following must be present: A germ. This may be a virus, bacteria, fungus, or parasite. A place for the germ to live. This may be: On or in a person, animal, plant, or food. In soil or water. On surfaces, such as a door handle. A person or animal who can develop a disease if the germ enters the body (host). The host does not have resistance to the germ. A way for the germ to enter the host. This may occur by: Direct contact with an infected person or animal. This can happen through shaking hands or hugging. Some germs can also travel through the air and spread to others. This can happen when an infected person coughs or sneezes on or near other people. Indirect contact. This occurs when the germ enters the host through contact with an infected object. Examples include: Eating or drinking food or water that is contaminated with the germ. Touching a contaminated surface with your hands, and then touching your face, eyes, nose, or mouth. Supplies needed: Soap. Alcohol-based hand sanitizer. Standard cleaning products. Disinfectants, such as bleach. Reusable cleaning cloths, sponges, or paper towels. Disposable or reusable utility gloves. How to prevent infection from spreading There are several things that you can do to help prevent infection from spreading. Take these general actions Everyone should take the following actions to prevent the spread of infection: Wash your hands often with soap and water for at least 20 seconds. If soap and water are not available, use alcohol-based hand sanitizer. Avoid  touching your face, mouth, nose, or eyes. Cough or sneeze into a tissue, sleeve, or elbow instead of into your hand or into the air. If you cough or sneeze into a tissue, throw it away immediately and wash your hands.  Keep your bathroom clean Provide soap. Change towels and washcloths frequently. Change toothbrushes often and store them separately in a clean, dry place. Clean and disinfect all surfaces, including the toilet, floor, tub, shower, and sink. Do not share personal items, such as razors, toothbrushes, deodorant, combs, brushes, towels, and washcloths. Maintain hygiene in the [facility] your hands before and after preparing food and before you eat. Clean the inside of your refrigerator each week. Keep your refrigerator set at 56F (4C) or less, and set your freezer at 32F (-18C) or less. Keep work surfaces clean. Disinfect them regularly. Wash your dishes in hot, soapy water. Air-dry your dishes or use a dishwasher. Do not share dishes or eating utensils. Handle food safely Store food carefully. Refrigerate leftovers promptly in covered containers. Throw out stale or spoiled food. Thaw foods in the refrigerator or microwave, not at room temperature. Serve foods at the proper temperature. Do not eat raw meat. Make sure it is cooked to the appropriate temperature. Cook eggs until they are firm. Wash fruits and vegetables under running water. Use separate cutting boards, plates, and utensils for raw foods and cooked foods. Use a clean spoon each time you sample food while cooking. Do laundry the right way Wear gloves if laundry is visibly soiled. Do not shake soiled laundry. Doing that may send germs  into the air. Wash laundry in hot water. If you cannot wash the laundry right away, place it in a plastic bag and wash it as soon as possible. Be careful around animals and pets Wash your hands before and after touching animals. If you have a pet, ensure that your pet stays  clean. Do not let people with weak immune systems touch bird droppings, fish tank water, or a litter box. If you have a pet cage or litter box, be sure to clean it every day. If you are sick, stay away from animals and have someone else care for them if possible. How to clean and disinfect objects and surfaces Precautions Some disinfectants work for certain germs and not others. Read the manufacturer's instructions or read online resources to determine if the product you are using will work for the germ you are trying to remove. If you choose to use bleach, use it safely. Never mix it with other cleaning products, especially those that contain ammonia. This mixture can create a dangerous gas that may be deadly. Keep proper movement of fresh air in your home (ventilation). Pour used mop water down the utility sink or toilet. Do not pour this water down the kitchen sink. Objects and surfaces  If surfaces are visibly soiled, clean them first with soap and water before disinfecting. Disinfect surfaces that are frequently touched every day. This may include: Counters. Tables. Doorknobs. Sinks and faucets. Electronics, such as: Architectural technologist. Remote controls. Keyboards. Computers and tablets. Cleaning supplies Some cleaning supplies can breed germs. Take good care of them to prevent germs from spreading. To do this: Soak toilet brushes, mops, and sponges in bleach and water for 5 minutes after each use, or according to manufacturer's instructions. Wash reusable cleaning cloths and sanitize sponges after each use. Throw away disposable gloves after one use. Replace reusable utility gloves if they are cracked or torn or if they start to peel. Additional actions if you are sick If you live with other people:  Avoid close contact with those around you. Stay at least 3 ft (1 m) away from others, if possible. Use a separate bathroom, if possible. If possible, sleep in a separate bedroom or in a  separate bed to prevent infecting other household members. Change bedroom linens each week or whenever they are soiled. Have everyone in the household wash hands often with soap and water for at least 20 seconds. If soap and water are not available, use alcohol-based hand sanitizer. In general: Stay home except to get medical care. Call ahead before visiting your health care provider. Ask others to get groceries and household supplies and to refill prescriptions for you. Avoid public areas. Try not to take public transportation. If you can, wear a mask if you need to go out of the house, or if you are in close contact with someone who is not sick. Avoid visitors until you have completely recovered, or until you have no signs and symptoms of infection. Avoid preparing food or providing care for others. If you must prepare food or provide care for others, wear a mask and wash your hands before and after doing these things. Where to find more information Centers for Disease Control and Prevention: [employer].cy Summary It is important to know how to keep infection from spreading. Make sure everyone in your household washes their hands often with soap and water. Disinfect surfaces that are frequently touched every day. If you are sick, stay home except to get medical care. This  information is not intended to replace advice given to you by your health care provider. Make sure you discuss any questions you have with your health care provider. Document Revised: 06/10/2021 Document Reviewed: 06/10/2021 Elsevier Patient Education  [place].

## 2022-01-19 ENCOUNTER — Telehealth: Payer: Self-pay | Admitting: Neurology

## 2022-01-19 NOTE — Telephone Encounter (Addendum)
Prior Authorization for Saxenda and Phentermine submitted via covermymeds. Awaiting response.

## 2022-01-21 ENCOUNTER — Other Ambulatory Visit: Payer: Self-pay | Admitting: Physician Assistant

## 2022-01-23 NOTE — Telephone Encounter (Signed)
Tried to call patient to let her know, no answer, no vm.

## 2022-01-27 NOTE — Telephone Encounter (Signed)
Initiated Prior authorization SPJ:SUNHRVA '18MG'$ /3ML pen-injectors, Via: Covermymeds Case/Key:[id number] Status: denied as of 01/27/22 Reason:weight loss products are an an excluded prescription benefit  Notified Pt via: Mychart, submission from another user    Initiated Prior authorization CQP:EAKLTYVDPBA HCl '15MG'$  capsules Via: Covermymeds Case/Key:[id number] Status: denied as of /01/27/22 Reason:weight loss products are an an excluded prescription benefit  submission from another user Notified Pt via: [place]

## 2022-02-03 ENCOUNTER — Ambulatory Visit
Admission: EM | Admit: 2022-02-03 | Discharge: 2022-02-03 | Disposition: A | Discharge status: Home / Self Care | Payer: Commercial Managed Care - PPO | Attending: Physician Assistant | Admitting: Physician Assistant

## 2022-02-03 ENCOUNTER — Encounter: Payer: Self-pay | Admitting: Emergency Medicine

## 2022-02-03 DIAGNOSIS — H16001 Unspecified corneal ulcer, right eye: Secondary | ICD-10-CM | POA: Diagnosis not present

## 2022-02-03 MED ORDER — CIPROFLOXACIN HCL 0.3 % OP OINT: TOPICAL_OINTMENT | OPHTHALMIC | 0 refills | Status: DC

## 2022-02-03 NOTE — Discharge Instructions (Addendum)
It is very important that you follow-up with your ophthalmologist tomorrow.  If you are unable to get in with them please contact the provider on-call whose information is on your after visit summary.  Use ciprofloxacin ointment twice daily.  This will blur your vision a few minutes after using this.  Make sure not to touch the tip of the medication bottle to your eye and wash your hands prior to handling medication to prevent contamination.  You can use lubricating eyedrops.  If you have any worsening symptoms including visual disturbance, increased pain, nausea, vomiting, headache you need to go to the emergency room.  Do not wear contacts until you have seen your ophthalmologist and symptoms have resolved.
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
## 2022-02-03 NOTE — ED Triage Notes (Signed)
Patient c/o right eye swelling, itching and pain x 1 day.  The right side of patient's face is very painful and itchy.  Patient does wear contacts which she has taken out w/o difficulty.  Patient states that she's allergic to "acid" so she's very careful as to what she eats.  Yesterday she pineapples, tomatoes and oranges and feels that she is having a flare from that.  She has taken Benadryl, saline drops and warm compresses.

## 2022-02-04 ENCOUNTER — Ambulatory Visit: Payer: Commercial Managed Care - PPO | Admitting: Physician Assistant

## 2022-04-08 ENCOUNTER — Other Ambulatory Visit: Payer: Self-pay | Admitting: Physician Assistant

## 2022-04-08 DIAGNOSIS — F419 Anxiety disorder, unspecified: Secondary | ICD-10-CM

## 2022-04-08 DIAGNOSIS — F331 Major depressive disorder, recurrent, moderate: Secondary | ICD-10-CM

## 2022-04-08 NOTE — Telephone Encounter (Signed)
Pt called requesting a refill on FLUoxetine (PROZAC) 20 MG capsule . She does have an appt scheduled with [name] on 04/16/22 but will need this medication filled before then.

## 2022-04-09 ENCOUNTER — Ambulatory Visit: Payer: Commercial Managed Care - PPO | Admitting: Physician Assistant

## 2022-04-16 ENCOUNTER — Encounter: Payer: Self-pay | Admitting: Physician Assistant

## 2022-04-16 ENCOUNTER — Ambulatory Visit (INDEPENDENT_AMBULATORY_CARE_PROVIDER_SITE_OTHER): Payer: Commercial Managed Care - PPO | Admitting: Physician Assistant

## 2022-04-16 VITALS — BP 115/70 | HR 87 | Ht 68.0 in | Wt 282.0 lb

## 2022-04-16 DIAGNOSIS — Z6841 Body Mass Index (BMI) 40.0 and over, adult: Secondary | ICD-10-CM | POA: Diagnosis not present

## 2022-04-16 DIAGNOSIS — R600 Localized edema: Secondary | ICD-10-CM | POA: Diagnosis not present

## 2022-04-16 DIAGNOSIS — M1A9XX0 Chronic gout, unspecified, without tophus (tophi): Secondary | ICD-10-CM | POA: Diagnosis not present

## 2022-04-16 DIAGNOSIS — F331 Major depressive disorder, recurrent, moderate: Secondary | ICD-10-CM | POA: Diagnosis not present

## 2022-04-16 DIAGNOSIS — M797 Fibromyalgia: Secondary | ICD-10-CM | POA: Diagnosis not present

## 2022-04-16 DIAGNOSIS — F419 Anxiety disorder, unspecified: Secondary | ICD-10-CM

## 2022-04-16 DIAGNOSIS — Z630 Problems in relationship with spouse or partner: Secondary | ICD-10-CM

## 2022-04-16 DIAGNOSIS — I10 Essential (primary) hypertension: Secondary | ICD-10-CM

## 2022-04-16 MED ORDER — LOSARTAN POTASSIUM-HCTZ 50-12.5 MG PO TABS: 1.0000 | ORAL_TABLET | Freq: Every day | ORAL | 1 refills | Status: DC

## 2022-04-16 MED ORDER — FUROSEMIDE 40 MG PO TABS: ORAL_TABLET | ORAL | 0 refills | Status: DC

## 2022-04-16 MED ORDER — FLUOXETINE HCL 40 MG PO CAPS: 40.0000 mg | ORAL_CAPSULE | Freq: Every day | ORAL | 3 refills | Status: DC

## 2022-04-16 MED ORDER — LORAZEPAM 0.5 MG PO TABS: 0.5000 mg | ORAL_TABLET | Freq: Three times a day (TID) | ORAL | 1 refills | Status: DC | PRN

## 2022-04-16 MED ORDER — LAMOTRIGINE 200 MG PO TABS: 200.0000 mg | ORAL_TABLET | Freq: Every day | ORAL | 1 refills | Status: DC

## 2022-04-16 MED ORDER — DULOXETINE HCL 60 MG PO CPEP: 60.0000 mg | ORAL_CAPSULE | Freq: Two times a day (BID) | ORAL | 1 refills | Status: DC
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
## 2022-04-17 DIAGNOSIS — M792 Neuralgia and neuritis, unspecified: Secondary | ICD-10-CM | POA: Diagnosis not present

## 2022-04-17 DIAGNOSIS — M109 Gout, unspecified: Secondary | ICD-10-CM | POA: Diagnosis not present

## 2022-04-17 DIAGNOSIS — M797 Fibromyalgia: Secondary | ICD-10-CM | POA: Diagnosis not present

## 2022-04-17 DIAGNOSIS — J4540 Moderate persistent asthma, uncomplicated: Secondary | ICD-10-CM | POA: Diagnosis not present

## 2022-04-17 DIAGNOSIS — F3160 Bipolar disorder, current episode mixed, unspecified: Secondary | ICD-10-CM | POA: Diagnosis not present

## 2022-04-17 DIAGNOSIS — Z6836 Body mass index (BMI) 36.0-36.9, adult: Secondary | ICD-10-CM | POA: Diagnosis not present

## 2022-04-17 DIAGNOSIS — Z713 Dietary counseling and surveillance: Secondary | ICD-10-CM | POA: Diagnosis not present

## 2022-04-17 DIAGNOSIS — I10 Essential (primary) hypertension: Secondary | ICD-10-CM | POA: Diagnosis not present

## 2022-04-17 DIAGNOSIS — E668 Other obesity: Secondary | ICD-10-CM | POA: Diagnosis not present

## 2022-04-17 DIAGNOSIS — Z1389 Encounter for screening for other disorder: Secondary | ICD-10-CM | POA: Diagnosis not present

## 2022-04-17 DIAGNOSIS — G43109 Migraine with aura, not intractable, without status migrainosus: Secondary | ICD-10-CM | POA: Diagnosis not present

## 2022-04-17 DIAGNOSIS — E781 Pure hyperglyceridemia: Secondary | ICD-10-CM | POA: Diagnosis not present

## 2022-04-29 DIAGNOSIS — Z630 Problems in relationship with spouse or partner: Secondary | ICD-10-CM | POA: Insufficient documentation

## 2022-05-01 ENCOUNTER — Encounter: Payer: Self-pay | Admitting: Neurology

## 2022-05-16 ENCOUNTER — Ambulatory Visit
Admission: RE | Admit: 2022-05-16 | Discharge: 2022-05-16 | Disposition: A | Discharge status: Home / Self Care | Payer: Medicaid Other | Source: Ambulatory Visit | Attending: Family Medicine | Admitting: Family Medicine

## 2022-05-16 VITALS — BP 116/77 | HR 85 | Temp 99.4°F | Resp 18 | Ht 68.0 in | Wt 266.0 lb

## 2022-05-16 DIAGNOSIS — N3000 Acute cystitis without hematuria: Secondary | ICD-10-CM

## 2022-05-16 DIAGNOSIS — R1012 Left upper quadrant pain: Secondary | ICD-10-CM | POA: Diagnosis not present

## 2022-05-16 LAB — POCT URINALYSIS DIP (MANUAL ENTRY)
Blood, UA: NEGATIVE
Glucose, UA: NEGATIVE mg/dL
Ketones, POC UA: NEGATIVE mg/dL
Nitrite, UA: NEGATIVE
Protein Ur, POC: 30 mg/dL — AB
Spec Grav, UA: >=1.03 — AB (ref 1.010–1.025)
Urobilinogen, UA: 0.2 E.U./dL
pH, UA: 5.5 (ref 5.0–8.0)

## 2022-05-16 MED ORDER — SULFAMETHOXAZOLE-TRIMETHOPRIM 800-160 MG PO TABS: 1.0000 | ORAL_TABLET | Freq: Two times a day (BID) | ORAL | 0 refills | Status: AC

## 2022-05-16 NOTE — ED Triage Notes (Signed)
Patient c/o LUQ pain x 2 days, some nausea, vomiting, no diarrhea.  Patient has an enlarged spleen.  Unable to keep anything down.  Decreased urine output.

## 2022-05-16 NOTE — Discharge Instructions (Addendum)
The patient to take medication as directed with food to completion.  Encouraged patient to increase daily water intake to 64 ounces per day while taking this medication.  Advised patient if left upper abdominal pain worsens and/or is unresolved please go to [place] ED for further evaluation to include advanced imaging and serological testing.
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
## 2022-05-17 ENCOUNTER — Telehealth: Payer: Self-pay

## 2022-05-17 LAB — URINE CULTURE: Culture: NO GROWTH

## 2022-05-17 NOTE — Telephone Encounter (Signed)
LMTRC if any questions or concerns. 

## 2022-05-23 ENCOUNTER — Other Ambulatory Visit: Payer: Self-pay | Admitting: Physician Assistant

## 2022-05-23 DIAGNOSIS — R1012 Left upper quadrant pain: Secondary | ICD-10-CM

## 2022-05-23 DIAGNOSIS — R140 Abdominal distension (gaseous): Secondary | ICD-10-CM

## 2022-06-25 ENCOUNTER — Ambulatory Visit
Admission: RE | Admit: 2022-06-25 | Discharge: 2022-06-25 | Disposition: A | Discharge status: Home / Self Care | Payer: Medicaid Other | Source: Ambulatory Visit | Attending: Family Medicine | Admitting: Family Medicine

## 2022-06-25 ENCOUNTER — Ambulatory Visit (INDEPENDENT_AMBULATORY_CARE_PROVIDER_SITE_OTHER): Payer: Medicaid Other

## 2022-06-25 DIAGNOSIS — M25571 Pain in right ankle and joints of right foot: Secondary | ICD-10-CM

## 2022-06-25 MED ORDER — KETOROLAC TROMETHAMINE 30 MG/ML IJ SOLN
60.0000 mg | Freq: Once | INTRAMUSCULAR | Status: AC
  Administered 2022-06-25: 60 mg via INTRAMUSCULAR

## 2022-06-25 MED ORDER — HYDROCODONE-IBUPROFEN 7.5-200 MG PO TABS: 1.0000 | ORAL_TABLET | Freq: Four times a day (QID) | ORAL | 0 refills | Status: DC | PRN

## 2022-06-25 NOTE — ED Triage Notes (Signed)
Pt c/o RT ankle pain x since Monday. Says she heard a sudden pop while walking at gas station. Visible swelling. Says some numbness in toes. Pain 6/10 Icing, elevating and biofreeze prn. Ibuprofen prn as well.

## 2022-06-25 NOTE — Discharge Instructions (Signed)
Use ice and elevate ankle to reduce pain and swelling Wear boot until you can walk comfortably without it Take ibuprofen for moderate pain Take Vicoprofen if pain is severe.  Do not drive on hydrocodone Call tomorrow to make an appointment to see Dr. Dianah Field next week
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
## 2022-06-30 ENCOUNTER — Ambulatory Visit (INDEPENDENT_AMBULATORY_CARE_PROVIDER_SITE_OTHER): Payer: Medicaid Other | Admitting: Sports Medicine

## 2022-06-30 DIAGNOSIS — J0100 Acute maxillary sinusitis, unspecified: Secondary | ICD-10-CM

## 2022-06-30 DIAGNOSIS — S86111A Strain of other muscle(s) and tendon(s) of posterior muscle group at lower leg level, right leg, initial encounter: Secondary | ICD-10-CM | POA: Diagnosis not present

## 2022-06-30 MED ORDER — AMOXICILLIN-POT CLAVULANATE 875-125 MG PO TABS: 1.0000 | ORAL_TABLET | Freq: Two times a day (BID) | ORAL | 0 refills | Status: DC

## 2022-06-30 MED ORDER — FLUCONAZOLE 150 MG PO TABS: 150.0000 mg | ORAL_TABLET | Freq: Once | ORAL | 3 refills | Status: AC

## 2022-06-30 MED ORDER — HYDROCODONE-IBUPROFEN 7.5-200 MG PO TABS: 1.0000 | ORAL_TABLET | Freq: Three times a day (TID) | ORAL | 0 refills | Status: AC | PRN

## 2022-06-30 MED ORDER — HYDROCODONE-ACETAMINOPHEN 5-325 MG PO TABS: 1.0000 | ORAL_TABLET | Freq: Three times a day (TID) | ORAL | 0 refills | Status: DC | PRN

## 2022-06-30 MED ORDER — TRIAZOLAM 0.25 MG PO TABS: ORAL_TABLET | ORAL | 0 refills | Status: DC

## 2022-06-30 NOTE — Addendum Note (Signed)
Addended by: [name] on: 06/30/2022 03:15 PM   Modules accepted: Orders

## 2022-06-30 NOTE — Assessment & Plan Note (Signed)
3 weeks of facial pain and pressure, nasal discharge. Adding Augmentin, Diflucan.

## 2022-06-30 NOTE — Assessment & Plan Note (Signed)
This is a pleasant 47 year old female, she has a long history of ankle pain, more recently she drove for 8 hours, as she stood up she felt a pop along the medial aspect of her right ankle, she then had great difficulty ambulating and felt significant weakness to inversion of the ankle. She was seen in urgent care, x-rays were unrevealing, she was placed in a boot and referred to me. On exam today she does have significant fullness medial ankle, minimal tenderness, she has 0/5 strength to inversion, great toe flexion and small toe flexion is normal, all of this points to rupture and retraction of the tibialis posterior. She will continue the boot, refilling hydrocodone, I would like an MRI of her right ankle.

## 2022-06-30 NOTE — Progress Notes (Signed)
    Procedures performed today:    None.  Independent interpretation of notes and tests performed by another provider:   None.  Brief History, Exam, Impression, and Recommendations:    Rupture of right posterior tibialis tendon This is a pleasant 47 year old female, she has a long history of ankle pain, more recently she drove for 8 hours, as she stood up she felt a pop along the medial aspect of her right ankle, she then had great difficulty ambulating and felt significant weakness to inversion of the ankle. She was seen in urgent care, x-rays were unrevealing, she was placed in a boot and referred to me. On exam today she does have significant fullness medial ankle, minimal tenderness, she has 0/5 strength to inversion, great toe flexion and small toe flexion is normal, all of this points to rupture and retraction of the tibialis posterior. She will continue the boot, refilling hydrocodone, I would like an MRI of her right ankle.  Acute maxillary sinusitis 3 weeks of facial pain and pressure, nasal discharge. Adding Augmentin, Diflucan.    ____________________________________________ Gwen Her. Dianah Field, M.D., ABFM., CAQSM., AME. Primary Care and Sports Medicine [facility]  Adjunct Professor of [place] [employer] of Medicine  Risk manager

## 2022-07-01 ENCOUNTER — Telehealth: Payer: Self-pay

## 2022-07-01 NOTE — Telephone Encounter (Addendum)
Initiated Prior authorization [id number] 7.5-'200MG'$  tablets Via: Covermymeds Case/Key:[id number] Status: approved  as of 07/01/22 Reason:Authorization Expiration Date: 12/31/2022 Notified Pt via: Mychart

## 2022-07-07 ENCOUNTER — Ambulatory Visit (INDEPENDENT_AMBULATORY_CARE_PROVIDER_SITE_OTHER): Payer: Medicaid Other

## 2022-07-07 DIAGNOSIS — M797 Fibromyalgia: Secondary | ICD-10-CM | POA: Diagnosis not present

## 2022-07-07 DIAGNOSIS — E781 Pure hyperglyceridemia: Secondary | ICD-10-CM | POA: Diagnosis not present

## 2022-07-07 DIAGNOSIS — F3161 Bipolar disorder, current episode mixed, mild: Secondary | ICD-10-CM | POA: Diagnosis not present

## 2022-07-07 DIAGNOSIS — G43109 Migraine with aura, not intractable, without status migrainosus: Secondary | ICD-10-CM | POA: Diagnosis not present

## 2022-07-07 DIAGNOSIS — S86111A Strain of other muscle(s) and tendon(s) of posterior muscle group at lower leg level, right leg, initial encounter: Secondary | ICD-10-CM

## 2022-07-07 DIAGNOSIS — Z6834 Body mass index (BMI) 34.0-34.9, adult: Secondary | ICD-10-CM | POA: Diagnosis not present

## 2022-07-07 DIAGNOSIS — J4540 Moderate persistent asthma, uncomplicated: Secondary | ICD-10-CM | POA: Diagnosis not present

## 2022-07-07 DIAGNOSIS — Z1389 Encounter for screening for other disorder: Secondary | ICD-10-CM | POA: Diagnosis not present

## 2022-07-07 DIAGNOSIS — D519 Vitamin B12 deficiency anemia, unspecified: Secondary | ICD-10-CM | POA: Diagnosis not present

## 2022-07-07 DIAGNOSIS — Z713 Dietary counseling and surveillance: Secondary | ICD-10-CM | POA: Diagnosis not present

## 2022-07-07 DIAGNOSIS — M19071 Primary osteoarthritis, right ankle and foot: Secondary | ICD-10-CM | POA: Diagnosis not present

## 2022-07-07 DIAGNOSIS — M109 Gout, unspecified: Secondary | ICD-10-CM | POA: Diagnosis not present

## 2022-07-07 DIAGNOSIS — E668 Other obesity: Secondary | ICD-10-CM | POA: Diagnosis not present

## 2022-07-07 DIAGNOSIS — I10 Essential (primary) hypertension: Secondary | ICD-10-CM | POA: Diagnosis not present

## 2022-07-09 NOTE — Addendum Note (Signed)
Addended by: [name] on: 07/09/2022 02:58 PM   Modules accepted: Orders

## 2022-07-14 ENCOUNTER — Telehealth: Payer: Self-pay

## 2022-07-14 NOTE — Telephone Encounter (Signed)
Initiated Prior authorization [id number] 0.'25MG'$  tablets Via: Covermymeds Case/Key:[id number] Status: approved  as of 07/14/22 Reason:Authorization Expiration Date: 07/11/2023 Notified Pt via: Mychart

## 2022-07-22 DIAGNOSIS — M66871 Spontaneous rupture of other tendons, right ankle and foot: Secondary | ICD-10-CM | POA: Diagnosis not present

## 2022-07-22 DIAGNOSIS — Q688 Other specified congenital musculoskeletal deformities: Secondary | ICD-10-CM | POA: Diagnosis not present

## 2022-07-31 DIAGNOSIS — D519 Vitamin B12 deficiency anemia, unspecified: Secondary | ICD-10-CM | POA: Diagnosis not present

## 2022-07-31 DIAGNOSIS — E559 Vitamin D deficiency, unspecified: Secondary | ICD-10-CM | POA: Diagnosis not present

## 2022-07-31 DIAGNOSIS — E781 Pure hyperglyceridemia: Secondary | ICD-10-CM | POA: Diagnosis not present

## 2022-08-04 DIAGNOSIS — M109 Gout, unspecified: Secondary | ICD-10-CM | POA: Diagnosis not present

## 2022-08-04 DIAGNOSIS — J4540 Moderate persistent asthma, uncomplicated: Secondary | ICD-10-CM | POA: Diagnosis not present

## 2022-08-04 DIAGNOSIS — E781 Pure hyperglyceridemia: Secondary | ICD-10-CM | POA: Diagnosis not present

## 2022-08-04 DIAGNOSIS — G43109 Migraine with aura, not intractable, without status migrainosus: Secondary | ICD-10-CM | POA: Diagnosis not present

## 2022-08-04 DIAGNOSIS — I10 Essential (primary) hypertension: Secondary | ICD-10-CM | POA: Diagnosis not present

## 2022-08-04 DIAGNOSIS — M797 Fibromyalgia: Secondary | ICD-10-CM | POA: Diagnosis not present

## 2022-08-04 DIAGNOSIS — E668 Other obesity: Secondary | ICD-10-CM | POA: Diagnosis not present

## 2022-08-04 DIAGNOSIS — Z1389 Encounter for screening for other disorder: Secondary | ICD-10-CM | POA: Diagnosis not present

## 2022-08-04 DIAGNOSIS — Z6834 Body mass index (BMI) 34.0-34.9, adult: Secondary | ICD-10-CM | POA: Diagnosis not present

## 2022-08-04 DIAGNOSIS — F3161 Bipolar disorder, current episode mixed, mild: Secondary | ICD-10-CM | POA: Diagnosis not present

## 2022-08-04 DIAGNOSIS — Z713 Dietary counseling and surveillance: Secondary | ICD-10-CM | POA: Diagnosis not present

## 2022-08-04 DIAGNOSIS — D519 Vitamin B12 deficiency anemia, unspecified: Secondary | ICD-10-CM | POA: Diagnosis not present

## 2022-08-19 DIAGNOSIS — Q688 Other specified congenital musculoskeletal deformities: Secondary | ICD-10-CM | POA: Diagnosis not present

## 2022-08-19 DIAGNOSIS — M66871 Spontaneous rupture of other tendons, right ankle and foot: Secondary | ICD-10-CM | POA: Diagnosis not present

## 2022-08-19 DIAGNOSIS — M76821 Posterior tibial tendinitis, right leg: Secondary | ICD-10-CM | POA: Diagnosis not present

## 2022-09-02 DIAGNOSIS — G43109 Migraine with aura, not intractable, without status migrainosus: Secondary | ICD-10-CM | POA: Diagnosis not present

## 2022-09-02 DIAGNOSIS — M797 Fibromyalgia: Secondary | ICD-10-CM | POA: Diagnosis not present

## 2022-09-02 DIAGNOSIS — M109 Gout, unspecified: Secondary | ICD-10-CM | POA: Diagnosis not present

## 2022-09-02 DIAGNOSIS — M792 Neuralgia and neuritis, unspecified: Secondary | ICD-10-CM | POA: Diagnosis not present

## 2022-09-02 DIAGNOSIS — E668 Other obesity: Secondary | ICD-10-CM | POA: Diagnosis not present

## 2022-09-02 DIAGNOSIS — Z713 Dietary counseling and surveillance: Secondary | ICD-10-CM | POA: Diagnosis not present

## 2022-09-02 DIAGNOSIS — E781 Pure hyperglyceridemia: Secondary | ICD-10-CM | POA: Diagnosis not present

## 2022-09-02 DIAGNOSIS — Z6833 Body mass index (BMI) 33.0-33.9, adult: Secondary | ICD-10-CM | POA: Diagnosis not present

## 2022-09-02 DIAGNOSIS — J4540 Moderate persistent asthma, uncomplicated: Secondary | ICD-10-CM | POA: Diagnosis not present

## 2022-09-02 DIAGNOSIS — F3161 Bipolar disorder, current episode mixed, mild: Secondary | ICD-10-CM | POA: Diagnosis not present

## 2022-09-02 DIAGNOSIS — I10 Essential (primary) hypertension: Secondary | ICD-10-CM | POA: Diagnosis not present

## 2022-09-02 DIAGNOSIS — Z1389 Encounter for screening for other disorder: Secondary | ICD-10-CM | POA: Diagnosis not present

## 2022-09-18 ENCOUNTER — Ambulatory Visit (INDEPENDENT_AMBULATORY_CARE_PROVIDER_SITE_OTHER): Payer: Medicaid Other | Admitting: Sports Medicine

## 2022-09-18 DIAGNOSIS — D170 Benign lipomatous neoplasm of skin and subcutaneous tissue of head, face and neck: Secondary | ICD-10-CM | POA: Diagnosis not present

## 2022-09-18 DIAGNOSIS — R600 Localized edema: Secondary | ICD-10-CM

## 2022-09-18 MED ORDER — FUROSEMIDE 40 MG PO TABS: ORAL_TABLET | ORAL | 0 refills | Status: DC

## 2022-09-18 NOTE — Progress Notes (Signed)
    Procedures performed today:    None.  Independent interpretation of notes and tests performed by another provider:   None.  Brief History, Exam, Impression, and Recommendations:    Lipoma of neck Moderate size lipoma left posterior neck, this was a little bit large for me to feel comfortable removing here in the office, we try to get her in with [facility] but it sounds like it was out of network for her, [facility] general surgery is in network so we will do the referral.    ____________________________________________ Ihor Austin. Benjamin Stain, M.D., ABFM., CAQSM., AME. Primary Care and Sports Medicine [facility]  Adjunct Professor of Family Medicine  [employer]  Restaurant manager, fast food

## 2022-09-18 NOTE — Assessment & Plan Note (Signed)
Moderate size lipoma left posterior neck, this was a little bit large for me to feel comfortable removing here in the office, we try to get her in with [facility] but it sounds like it was out of network for her, [facility] general surgery is in network so we will do the referral.

## 2022-10-01 DIAGNOSIS — M109 Gout, unspecified: Secondary | ICD-10-CM | POA: Diagnosis not present

## 2022-10-01 DIAGNOSIS — Z713 Dietary counseling and surveillance: Secondary | ICD-10-CM | POA: Diagnosis not present

## 2022-10-01 DIAGNOSIS — E668 Other obesity: Secondary | ICD-10-CM | POA: Diagnosis not present

## 2022-10-01 DIAGNOSIS — E781 Pure hyperglyceridemia: Secondary | ICD-10-CM | POA: Diagnosis not present

## 2022-10-01 DIAGNOSIS — G43109 Migraine with aura, not intractable, without status migrainosus: Secondary | ICD-10-CM | POA: Diagnosis not present

## 2022-10-01 DIAGNOSIS — M797 Fibromyalgia: Secondary | ICD-10-CM | POA: Diagnosis not present

## 2022-10-01 DIAGNOSIS — M792 Neuralgia and neuritis, unspecified: Secondary | ICD-10-CM | POA: Diagnosis not present

## 2022-10-01 DIAGNOSIS — Z1389 Encounter for screening for other disorder: Secondary | ICD-10-CM | POA: Diagnosis not present

## 2022-10-01 DIAGNOSIS — Z6833 Body mass index (BMI) 33.0-33.9, adult: Secondary | ICD-10-CM | POA: Diagnosis not present

## 2022-10-01 DIAGNOSIS — I10 Essential (primary) hypertension: Secondary | ICD-10-CM | POA: Diagnosis not present

## 2022-10-01 DIAGNOSIS — F3161 Bipolar disorder, current episode mixed, mild: Secondary | ICD-10-CM | POA: Diagnosis not present

## 2022-10-01 DIAGNOSIS — J4540 Moderate persistent asthma, uncomplicated: Secondary | ICD-10-CM | POA: Diagnosis not present

## 2022-10-06 DIAGNOSIS — D170 Benign lipomatous neoplasm of skin and subcutaneous tissue of head, face and neck: Secondary | ICD-10-CM | POA: Diagnosis not present

## 2022-10-09 DIAGNOSIS — M76821 Posterior tibial tendinitis, right leg: Secondary | ICD-10-CM | POA: Diagnosis not present

## 2022-10-10 DIAGNOSIS — D170 Benign lipomatous neoplasm of skin and subcutaneous tissue of head, face and neck: Secondary | ICD-10-CM | POA: Diagnosis not present

## 2022-10-12 ENCOUNTER — Other Ambulatory Visit: Payer: Self-pay | Admitting: Physician Assistant

## 2022-10-12 DIAGNOSIS — F331 Major depressive disorder, recurrent, moderate: Secondary | ICD-10-CM

## 2022-10-17 ENCOUNTER — Ambulatory Visit: Payer: Commercial Managed Care - PPO | Admitting: Physician Assistant

## 2022-10-17 DIAGNOSIS — D171 Benign lipomatous neoplasm of skin and subcutaneous tissue of trunk: Secondary | ICD-10-CM | POA: Diagnosis not present

## 2022-10-24 ENCOUNTER — Ambulatory Visit (INDEPENDENT_AMBULATORY_CARE_PROVIDER_SITE_OTHER): Payer: Medicaid Other | Admitting: Physician Assistant

## 2022-10-24 ENCOUNTER — Encounter: Payer: Self-pay | Admitting: Physician Assistant

## 2022-10-24 VITALS — BP 130/89 | HR 97 | Ht 68.0 in | Wt 252.0 lb

## 2022-10-24 DIAGNOSIS — E781 Pure hyperglyceridemia: Secondary | ICD-10-CM

## 2022-10-24 DIAGNOSIS — B351 Tinea unguium: Secondary | ICD-10-CM

## 2022-10-24 DIAGNOSIS — Z1329 Encounter for screening for other suspected endocrine disorder: Secondary | ICD-10-CM | POA: Diagnosis not present

## 2022-10-24 DIAGNOSIS — Z131 Encounter for screening for diabetes mellitus: Secondary | ICD-10-CM | POA: Diagnosis not present

## 2022-10-24 DIAGNOSIS — Z79899 Other long term (current) drug therapy: Secondary | ICD-10-CM

## 2022-10-24 DIAGNOSIS — L089 Local infection of the skin and subcutaneous tissue, unspecified: Secondary | ICD-10-CM | POA: Diagnosis not present

## 2022-10-24 DIAGNOSIS — Z6838 Body mass index (BMI) 38.0-38.9, adult: Secondary | ICD-10-CM

## 2022-10-24 DIAGNOSIS — E6609 Other obesity due to excess calories: Secondary | ICD-10-CM | POA: Diagnosis not present

## 2022-10-24 DIAGNOSIS — E559 Vitamin D deficiency, unspecified: Secondary | ICD-10-CM

## 2022-10-24 DIAGNOSIS — M797 Fibromyalgia: Secondary | ICD-10-CM

## 2022-10-24 DIAGNOSIS — M1A09X0 Idiopathic chronic gout, multiple sites, without tophus (tophi): Secondary | ICD-10-CM

## 2022-10-24 MED ORDER — CICLOPIROX 8 % EX SOLN: Freq: Every day | CUTANEOUS | 1 refills | Status: DC

## 2022-10-24 MED ORDER — VITAMIN D (ERGOCALCIFEROL) 1.25 MG (50000 UNIT) PO CAPS: 50000.0000 [IU] | ORAL_CAPSULE | ORAL | 0 refills | Status: DC

## 2022-10-24 MED ORDER — DULOXETINE HCL 60 MG PO CPEP
60.0000 mg | ORAL_CAPSULE | Freq: Two times a day (BID) | ORAL | 1 refills | Status: DC
Start: 2022-10-24 — End: 2023-04-27

## 2022-10-24 MED ORDER — MUPIROCIN 2 % EX OINT
TOPICAL_OINTMENT | CUTANEOUS | 0 refills | Status: DC
Start: 2022-10-24 — End: 2023-08-24
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
## 2022-10-27 ENCOUNTER — Encounter: Payer: Self-pay | Admitting: Physician Assistant

## 2022-10-27 DIAGNOSIS — B351 Tinea unguium: Secondary | ICD-10-CM | POA: Insufficient documentation

## 2022-10-27 DIAGNOSIS — E6609 Other obesity due to excess calories: Secondary | ICD-10-CM | POA: Insufficient documentation

## 2022-10-31 DIAGNOSIS — M797 Fibromyalgia: Secondary | ICD-10-CM | POA: Diagnosis not present

## 2022-10-31 DIAGNOSIS — E781 Pure hyperglyceridemia: Secondary | ICD-10-CM | POA: Diagnosis not present

## 2022-10-31 DIAGNOSIS — G43109 Migraine with aura, not intractable, without status migrainosus: Secondary | ICD-10-CM | POA: Diagnosis not present

## 2022-10-31 DIAGNOSIS — D511 Vitamin B12 deficiency anemia due to selective vitamin B12 malabsorption with proteinuria: Secondary | ICD-10-CM | POA: Diagnosis not present

## 2022-10-31 DIAGNOSIS — I10 Essential (primary) hypertension: Secondary | ICD-10-CM | POA: Diagnosis not present

## 2022-10-31 DIAGNOSIS — F3161 Bipolar disorder, current episode mixed, mild: Secondary | ICD-10-CM | POA: Diagnosis not present

## 2022-10-31 DIAGNOSIS — Z6833 Body mass index (BMI) 33.0-33.9, adult: Secondary | ICD-10-CM | POA: Diagnosis not present

## 2022-10-31 DIAGNOSIS — Z713 Dietary counseling and surveillance: Secondary | ICD-10-CM | POA: Diagnosis not present

## 2022-10-31 DIAGNOSIS — M109 Gout, unspecified: Secondary | ICD-10-CM | POA: Diagnosis not present

## 2022-10-31 DIAGNOSIS — J4540 Moderate persistent asthma, uncomplicated: Secondary | ICD-10-CM | POA: Diagnosis not present

## 2022-10-31 DIAGNOSIS — E668 Other obesity: Secondary | ICD-10-CM | POA: Diagnosis not present

## 2022-10-31 DIAGNOSIS — Z1389 Encounter for screening for other disorder: Secondary | ICD-10-CM | POA: Diagnosis not present

## 2022-11-18 DIAGNOSIS — M66871 Spontaneous rupture of other tendons, right ankle and foot: Secondary | ICD-10-CM | POA: Diagnosis not present

## 2022-11-18 DIAGNOSIS — M76821 Posterior tibial tendinitis, right leg: Secondary | ICD-10-CM | POA: Diagnosis not present

## 2022-11-18 DIAGNOSIS — Q688 Other specified congenital musculoskeletal deformities: Secondary | ICD-10-CM | POA: Diagnosis not present

## 2022-11-18 DIAGNOSIS — T8484XA Pain due to internal orthopedic prosthetic devices, implants and grafts, initial encounter: Secondary | ICD-10-CM | POA: Diagnosis not present

## 2022-11-26 DIAGNOSIS — M797 Fibromyalgia: Secondary | ICD-10-CM | POA: Diagnosis not present

## 2022-11-26 DIAGNOSIS — E668 Other obesity: Secondary | ICD-10-CM | POA: Diagnosis not present

## 2022-11-26 DIAGNOSIS — G43109 Migraine with aura, not intractable, without status migrainosus: Secondary | ICD-10-CM | POA: Diagnosis not present

## 2022-11-26 DIAGNOSIS — D511 Vitamin B12 deficiency anemia due to selective vitamin B12 malabsorption with proteinuria: Secondary | ICD-10-CM | POA: Diagnosis not present

## 2022-11-26 DIAGNOSIS — F3161 Bipolar disorder, current episode mixed, mild: Secondary | ICD-10-CM | POA: Diagnosis not present

## 2022-11-26 DIAGNOSIS — M109 Gout, unspecified: Secondary | ICD-10-CM | POA: Diagnosis not present

## 2022-11-26 DIAGNOSIS — Z1389 Encounter for screening for other disorder: Secondary | ICD-10-CM | POA: Diagnosis not present

## 2022-11-26 DIAGNOSIS — I10 Essential (primary) hypertension: Secondary | ICD-10-CM | POA: Diagnosis not present

## 2022-11-26 DIAGNOSIS — Z6832 Body mass index (BMI) 32.0-32.9, adult: Secondary | ICD-10-CM | POA: Diagnosis not present

## 2022-11-26 DIAGNOSIS — E781 Pure hyperglyceridemia: Secondary | ICD-10-CM | POA: Diagnosis not present

## 2022-11-26 DIAGNOSIS — J4540 Moderate persistent asthma, uncomplicated: Secondary | ICD-10-CM | POA: Diagnosis not present

## 2022-11-26 DIAGNOSIS — Z713 Dietary counseling and surveillance: Secondary | ICD-10-CM | POA: Diagnosis not present

## 2022-12-30 DIAGNOSIS — D511 Vitamin B12 deficiency anemia due to selective vitamin B12 malabsorption with proteinuria: Secondary | ICD-10-CM | POA: Diagnosis not present

## 2022-12-30 DIAGNOSIS — G43109 Migraine with aura, not intractable, without status migrainosus: Secondary | ICD-10-CM | POA: Diagnosis not present

## 2022-12-30 DIAGNOSIS — M797 Fibromyalgia: Secondary | ICD-10-CM | POA: Diagnosis not present

## 2022-12-30 DIAGNOSIS — E781 Pure hyperglyceridemia: Secondary | ICD-10-CM | POA: Diagnosis not present

## 2022-12-30 DIAGNOSIS — Z6831 Body mass index (BMI) 31.0-31.9, adult: Secondary | ICD-10-CM | POA: Diagnosis not present

## 2022-12-30 DIAGNOSIS — Z1389 Encounter for screening for other disorder: Secondary | ICD-10-CM | POA: Diagnosis not present

## 2022-12-30 DIAGNOSIS — E668 Other obesity: Secondary | ICD-10-CM | POA: Diagnosis not present

## 2022-12-30 DIAGNOSIS — Z713 Dietary counseling and surveillance: Secondary | ICD-10-CM | POA: Diagnosis not present

## 2022-12-30 DIAGNOSIS — J4540 Moderate persistent asthma, uncomplicated: Secondary | ICD-10-CM | POA: Diagnosis not present

## 2022-12-30 DIAGNOSIS — F3161 Bipolar disorder, current episode mixed, mild: Secondary | ICD-10-CM | POA: Diagnosis not present

## 2022-12-30 DIAGNOSIS — M109 Gout, unspecified: Secondary | ICD-10-CM | POA: Diagnosis not present

## 2022-12-30 DIAGNOSIS — I10 Essential (primary) hypertension: Secondary | ICD-10-CM | POA: Diagnosis not present

## 2023-01-01 DIAGNOSIS — G8918 Other acute postprocedural pain: Secondary | ICD-10-CM | POA: Diagnosis not present

## 2023-01-01 DIAGNOSIS — M2141 Flat foot [pes planus] (acquired), right foot: Secondary | ICD-10-CM | POA: Diagnosis not present

## 2023-01-01 DIAGNOSIS — Q688 Other specified congenital musculoskeletal deformities: Secondary | ICD-10-CM | POA: Diagnosis not present

## 2023-01-01 DIAGNOSIS — M66871 Spontaneous rupture of other tendons, right ankle and foot: Secondary | ICD-10-CM | POA: Diagnosis not present

## 2023-01-01 DIAGNOSIS — M659 Synovitis and tenosynovitis, unspecified: Secondary | ICD-10-CM | POA: Diagnosis not present

## 2023-01-01 DIAGNOSIS — T8484XA Pain due to internal orthopedic prosthetic devices, implants and grafts, initial encounter: Secondary | ICD-10-CM | POA: Diagnosis not present

## 2023-01-14 ENCOUNTER — Other Ambulatory Visit: Payer: Self-pay

## 2023-01-14 DIAGNOSIS — Z630 Problems in relationship with spouse or partner: Secondary | ICD-10-CM

## 2023-01-14 DIAGNOSIS — F419 Anxiety disorder, unspecified: Secondary | ICD-10-CM

## 2023-01-14 DIAGNOSIS — F331 Major depressive disorder, recurrent, moderate: Secondary | ICD-10-CM

## 2023-01-14 MED ORDER — FLUOXETINE HCL 40 MG PO CAPS
40.0000 mg | ORAL_CAPSULE | Freq: Every day | ORAL | 1 refills | Status: DC
Start: 2023-01-14 — End: 2023-04-27

## 2023-01-14 MED ORDER — LAMOTRIGINE 200 MG PO TABS: 200.0000 mg | ORAL_TABLET | Freq: Every day | ORAL | 1 refills | Status: DC

## 2023-01-16 DIAGNOSIS — M76821 Posterior tibial tendinitis, right leg: Secondary | ICD-10-CM | POA: Diagnosis not present

## 2023-01-16 DIAGNOSIS — Z4789 Encounter for other orthopedic aftercare: Secondary | ICD-10-CM | POA: Diagnosis not present

## 2023-01-16 DIAGNOSIS — T8484XA Pain due to internal orthopedic prosthetic devices, implants and grafts, initial encounter: Secondary | ICD-10-CM | POA: Diagnosis not present

## 2023-01-21 ENCOUNTER — Other Ambulatory Visit: Payer: Self-pay | Admitting: Sports Medicine

## 2023-01-21 DIAGNOSIS — R600 Localized edema: Secondary | ICD-10-CM

## 2023-01-21 NOTE — Telephone Encounter (Signed)
To PCP

## 2023-01-27 DIAGNOSIS — Z4789 Encounter for other orthopedic aftercare: Secondary | ICD-10-CM | POA: Diagnosis not present

## 2023-01-27 DIAGNOSIS — M2141 Flat foot [pes planus] (acquired), right foot: Secondary | ICD-10-CM | POA: Diagnosis not present

## 2023-01-27 DIAGNOSIS — M76821 Posterior tibial tendinitis, right leg: Secondary | ICD-10-CM | POA: Diagnosis not present

## 2023-01-29 DIAGNOSIS — Z1389 Encounter for screening for other disorder: Secondary | ICD-10-CM | POA: Diagnosis not present

## 2023-01-29 DIAGNOSIS — Z713 Dietary counseling and surveillance: Secondary | ICD-10-CM | POA: Diagnosis not present

## 2023-01-29 DIAGNOSIS — E668 Other obesity: Secondary | ICD-10-CM | POA: Diagnosis not present

## 2023-01-29 DIAGNOSIS — Z4789 Encounter for other orthopedic aftercare: Secondary | ICD-10-CM | POA: Diagnosis not present

## 2023-01-29 DIAGNOSIS — G43109 Migraine with aura, not intractable, without status migrainosus: Secondary | ICD-10-CM | POA: Diagnosis not present

## 2023-01-29 DIAGNOSIS — I10 Essential (primary) hypertension: Secondary | ICD-10-CM | POA: Diagnosis not present

## 2023-01-29 DIAGNOSIS — Z6831 Body mass index (BMI) 31.0-31.9, adult: Secondary | ICD-10-CM | POA: Diagnosis not present

## 2023-01-29 DIAGNOSIS — E781 Pure hyperglyceridemia: Secondary | ICD-10-CM | POA: Diagnosis not present

## 2023-01-29 DIAGNOSIS — M109 Gout, unspecified: Secondary | ICD-10-CM | POA: Diagnosis not present

## 2023-01-29 DIAGNOSIS — M792 Neuralgia and neuritis, unspecified: Secondary | ICD-10-CM | POA: Diagnosis not present

## 2023-01-29 DIAGNOSIS — M2141 Flat foot [pes planus] (acquired), right foot: Secondary | ICD-10-CM | POA: Diagnosis not present

## 2023-01-29 DIAGNOSIS — J4540 Moderate persistent asthma, uncomplicated: Secondary | ICD-10-CM | POA: Diagnosis not present

## 2023-01-29 DIAGNOSIS — F3161 Bipolar disorder, current episode mixed, mild: Secondary | ICD-10-CM | POA: Diagnosis not present

## 2023-01-29 DIAGNOSIS — M76821 Posterior tibial tendinitis, right leg: Secondary | ICD-10-CM | POA: Diagnosis not present

## 2023-01-29 DIAGNOSIS — M797 Fibromyalgia: Secondary | ICD-10-CM | POA: Diagnosis not present

## 2023-02-12 ENCOUNTER — Ambulatory Visit (INDEPENDENT_AMBULATORY_CARE_PROVIDER_SITE_OTHER): Payer: Medicaid Other | Admitting: Medical-Surgical

## 2023-02-12 ENCOUNTER — Encounter: Payer: Self-pay | Admitting: Medical-Surgical

## 2023-02-12 VITALS — BP 136/81 | HR 79 | Resp 20 | Ht 68.0 in | Wt 232.0 lb

## 2023-02-12 DIAGNOSIS — K2900 Acute gastritis without bleeding: Secondary | ICD-10-CM | POA: Diagnosis not present

## 2023-02-12 MED ORDER — ALUM & MAG HYDROXIDE-SIMETH 400-400-40 MG/5ML PO SUSP
30.0000 mL | Freq: Once | ORAL | Status: DC
Start: 2023-02-12 — End: 2023-02-12

## 2023-02-12 MED ORDER — HYOSCYAMINE SULFATE 0.125 MG PO TBDP
0.1250 mg | ORAL_TABLET | Freq: Once | ORAL | Status: AC
Start: 2023-02-12 — End: 2023-02-12
  Administered 2023-02-12: 0.125 mg via SUBLINGUAL

## 2023-02-12 MED ORDER — PANTOPRAZOLE SODIUM 40 MG PO TBEC: 40.0000 mg | DELAYED_RELEASE_TABLET | Freq: Every day | ORAL | 3 refills | Status: DC

## 2023-02-12 MED ORDER — PROMETHAZINE HCL 25 MG PO TABS: 25.0000 mg | ORAL_TABLET | Freq: Four times a day (QID) | ORAL | 2 refills | Status: AC | PRN

## 2023-02-12 MED ORDER — TRAMADOL HCL 50 MG PO TABS: 50.0000 mg | ORAL_TABLET | Freq: Three times a day (TID) | ORAL | 0 refills | Status: AC | PRN

## 2023-02-12 MED ORDER — SUCRALFATE 1 G PO TABS: 1.0000 g | ORAL_TABLET | Freq: Three times a day (TID) | ORAL | 0 refills | Status: DC

## 2023-02-12 MED ORDER — ALUM & MAG HYDROXIDE-SIMETH 200-200-20 MG/5ML PO SUSP
30.0000 mL | Freq: Once | ORAL | Status: AC
Start: 2023-02-12 — End: 2023-02-12
  Administered 2023-02-12: 30 mL via ORAL

## 2023-02-12 MED ORDER — LIDOCAINE VISCOUS HCL 2 % MT SOLN
15.0000 mL | Freq: Once | OROMUCOSAL | Status: AC
Start: 2023-02-12 — End: 2023-02-12
  Administered 2023-02-12: 15 mL via OROMUCOSAL

## 2023-02-12 NOTE — Progress Notes (Signed)
        Established patient visit  History, exam, impression, and plan:  1. Acute gastritis, presence of bleeding unspecified, unspecified gastritis type Pleasant 47 year old female presenting today with reports of severe nausea and vomiting that has been a problem over the last 5 days or so.  She recently had surgery and is currently on antibiotics as well as ibuprofen 600 mg every 6 hours.  She initially started out with some nausea when starting the Keflex however has progressed and now she is unable to tolerate food at all and even water is causing discomfort.  She has burning in the epigastric area that is constant however the pain becomes sharp and stabbing after eating anything.  Has noticed that she has coughed up a small amount of blood but no hematemesis.  Reports that she used to be on a stomach medication every day however was switched to omeprazole and her insurance will not cover this.  She has not been taking this due to the cost.  She tried Zofran for nausea but this was unhelpful.  She called her surgeon's office regarding her symptoms and being unable to tolerate the Keflex however they said call her PCP because it was a medical problem.  Endorses that she has not been able to get her medications down for several days.  Has a follow-up with her surgeon tomorrow.  After discussion, feel that she has acute gastritis secondary to ibuprofen use and antibiotic side effects.  Since Zofran is unhelpful, switching to promethazine 25 mg 3 times daily as needed.  Adding Carafate 1 g 4 times daily.  Restart PPI with Protonix 40 mg daily.  Sending tramadol 50 mg 3 times daily as needed for pain control.  If approved and available at the pharmacy, recommend cutting back on how often she is taking ibuprofen to see if this helps.  GI cocktail given in office today with moderate relief of symptoms.    Procedures performed this visit: None.  Return if symptoms worsen or fail to  improve.  __________________________________ Thayer Ohm, DNP, APRN, FNP-BC Primary Care and Sports Medicine [facility]

## 2023-02-13 DIAGNOSIS — L97512 Non-pressure chronic ulcer of other part of right foot with fat layer exposed: Secondary | ICD-10-CM | POA: Diagnosis not present

## 2023-02-25 ENCOUNTER — Other Ambulatory Visit: Payer: Self-pay | Admitting: Physician Assistant

## 2023-02-25 DIAGNOSIS — R600 Localized edema: Secondary | ICD-10-CM

## 2023-02-25 DIAGNOSIS — L97512 Non-pressure chronic ulcer of other part of right foot with fat layer exposed: Secondary | ICD-10-CM | POA: Diagnosis not present

## 2023-03-11 DIAGNOSIS — L97512 Non-pressure chronic ulcer of other part of right foot with fat layer exposed: Secondary | ICD-10-CM | POA: Diagnosis not present

## 2023-03-20 DIAGNOSIS — Z1231 Encounter for screening mammogram for malignant neoplasm of breast: Secondary | ICD-10-CM | POA: Diagnosis not present

## 2023-04-08 ENCOUNTER — Other Ambulatory Visit: Payer: Self-pay | Admitting: Physician Assistant

## 2023-04-08 DIAGNOSIS — R600 Localized edema: Secondary | ICD-10-CM

## 2023-04-13 ENCOUNTER — Ambulatory Visit: Payer: Self-pay | Admitting: Physician Assistant

## 2023-04-13 NOTE — Telephone Encounter (Signed)
Copied from CRM [id number]. Topic: Appointments - Scheduling Inquiry for Clinic >> Apr 13, 2023  1:01 PM Colletta Maryland S wrote: Reason for CRM: pt is inquiring on scheduling appt for 12/9, pt is experiencing headache that goes down to neck, callback number is [phone]

## 2023-04-14 NOTE — Telephone Encounter (Signed)
Attempting to assist patient with scheduling appt but call was disconnected.

## 2023-04-16 ENCOUNTER — Telehealth: Payer: Self-pay

## 2023-04-16 NOTE — Telephone Encounter (Signed)
Copied from CRM [id number]. Topic: General - Other >> Apr 14, 2023  2:44 PM Danika B wrote: Reason for CRM: Patient was speaking with America Brown and was disconnected. Attempted to call back while she was on other call. Made patient aware she will get call back once she is available. Callback [phone]

## 2023-04-16 NOTE — Telephone Encounter (Signed)
Spoke with patient. Issue has been resolved.

## 2023-04-17 NOTE — Telephone Encounter (Signed)
Spoke with patient . Issue has resolved and no appointment needed a this time.

## 2023-04-27 ENCOUNTER — Ambulatory Visit: Payer: Medicaid Other | Admitting: Physician Assistant

## 2023-04-27 VITALS — BP 148/86 | HR 95 | Resp 12 | Ht 68.0 in | Wt 250.0 lb

## 2023-04-27 DIAGNOSIS — J309 Allergic rhinitis, unspecified: Secondary | ICD-10-CM

## 2023-04-27 DIAGNOSIS — M797 Fibromyalgia: Secondary | ICD-10-CM

## 2023-04-27 DIAGNOSIS — F331 Major depressive disorder, recurrent, moderate: Secondary | ICD-10-CM

## 2023-04-27 DIAGNOSIS — F419 Anxiety disorder, unspecified: Secondary | ICD-10-CM

## 2023-04-27 DIAGNOSIS — L97512 Non-pressure chronic ulcer of other part of right foot with fat layer exposed: Secondary | ICD-10-CM

## 2023-04-27 DIAGNOSIS — M2669 Other specified disorders of temporomandibular joint: Secondary | ICD-10-CM

## 2023-04-27 DIAGNOSIS — Z630 Problems in relationship with spouse or partner: Secondary | ICD-10-CM

## 2023-04-27 DIAGNOSIS — Z79899 Other long term (current) drug therapy: Secondary | ICD-10-CM

## 2023-04-27 DIAGNOSIS — E781 Pure hyperglyceridemia: Secondary | ICD-10-CM

## 2023-04-27 MED ORDER — LAMOTRIGINE 200 MG PO TABS
200.0000 mg | ORAL_TABLET | Freq: Every day | ORAL | 1 refills | Status: AC
Start: 2023-04-27 — End: ?

## 2023-04-27 MED ORDER — AMOXICILLIN-POT CLAVULANATE 875-125 MG PO TABS: 1.0000 | ORAL_TABLET | Freq: Two times a day (BID) | ORAL | 0 refills | Status: DC

## 2023-04-27 MED ORDER — CYCLOBENZAPRINE HCL 10 MG PO TABS: 10.0000 mg | ORAL_TABLET | Freq: Every day | ORAL | 1 refills | Status: AC

## 2023-04-27 MED ORDER — METHYLPREDNISOLONE SODIUM SUCC 125 MG IJ SOLR
125.0000 mg | Freq: Once | INTRAMUSCULAR | Status: AC
  Administered 2023-04-27: 125 mg via INTRAMUSCULAR

## 2023-04-27 MED ORDER — FLUOXETINE HCL 40 MG PO CAPS
40.0000 mg | ORAL_CAPSULE | Freq: Every day | ORAL | 1 refills | Status: AC
Start: 2023-04-27 — End: ?

## 2023-04-27 MED ORDER — LORAZEPAM 0.5 MG PO TABS
0.5000 mg | ORAL_TABLET | Freq: Three times a day (TID) | ORAL | 2 refills | Status: AC | PRN
Start: 2023-04-27 — End: ?

## 2023-04-27 MED ORDER — DULOXETINE HCL 60 MG PO CPEP
60.0000 mg | ORAL_CAPSULE | Freq: Two times a day (BID) | ORAL | 1 refills | Status: AC
Start: 2023-04-27 — End: ?

## 2023-04-27 NOTE — Patient Instructions (Signed)
Use duoderm on foot wound change every 2-3 days
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
## 2023-04-28 LAB — CBC WITH DIFFERENTIAL/PLATELET
Basophils Absolute: 0.1 10*3/uL (ref 0.0–0.2)
Basos: 1 %
EOS (ABSOLUTE): 0.4 10*3/uL (ref 0.0–0.4)
Eos: 4 %
Hematocrit: 44.8 % (ref 34.0–46.6)
Hemoglobin: 13.6 g/dL (ref 11.1–15.9)
Immature Grans (Abs): 0.1 10*3/uL (ref 0.0–0.1)
Immature Granulocytes: 1 %
Lymphocytes Absolute: 2.2 10*3/uL (ref 0.7–3.1)
Lymphs: 20 %
MCH: 22.1 pg — ABNORMAL LOW (ref 26.6–33.0)
MCHC: 30.4 g/dL — ABNORMAL LOW (ref 31.5–35.7)
MCV: 73 fL — ABNORMAL LOW (ref 79–97)
Monocytes Absolute: 0.7 10*3/uL (ref 0.1–0.9)
Monocytes: 6 %
Neutrophils Absolute: 7.3 10*3/uL — ABNORMAL HIGH (ref 1.4–7.0)
Neutrophils: 68 %
Platelets: 468 10*3/uL — ABNORMAL HIGH (ref 150–450)
RBC: 6.14 x10E6/uL — ABNORMAL HIGH (ref 3.77–5.28)
RDW: 17.8 % — ABNORMAL HIGH (ref 11.7–15.4)
WBC: 10.8 10*3/uL (ref 3.4–10.8)

## 2023-04-28 LAB — CMP14+EGFR
ALT: 10 [IU]/L (ref 0–32)
AST: 15 [IU]/L (ref 0–40)
Albumin: 4.6 g/dL (ref 3.9–4.9)
Alkaline Phosphatase: 76 [IU]/L (ref 44–121)
BUN/Creatinine Ratio: 21 (ref 9–23)
BUN: 16 mg/dL (ref 6–24)
Bilirubin Total: 0.3 mg/dL (ref 0.0–1.2)
CO2: 24 mmol/L (ref 20–29)
Calcium: 9.7 mg/dL (ref 8.7–10.2)
Chloride: 101 mmol/L (ref 96–106)
Creatinine, Ser: 0.76 mg/dL (ref 0.57–1.00)
Globulin, Total: 2.2 g/dL (ref 1.5–4.5)
Glucose: 72 mg/dL (ref 70–99)
Potassium: 4.1 mmol/L (ref 3.5–5.2)
Sodium: 141 mmol/L (ref 134–144)
Total Protein: 6.8 g/dL (ref 6.0–8.5)
eGFR: 96 mL/min/{1.73_m2} (ref >59)

## 2023-04-28 LAB — LIPID PANEL
Chol/HDL Ratio: 4.6 {ratio} — ABNORMAL HIGH (ref 0.0–4.4)
Cholesterol, Total: 115 mg/dL (ref 100–199)
HDL: 25 mg/dL — ABNORMAL LOW (ref >39)
LDL Chol Calc (NIH): 38 mg/dL (ref 0–99)
Triglycerides: 355 mg/dL — ABNORMAL HIGH (ref 0–149)
VLDL Cholesterol Cal: 52 mg/dL — ABNORMAL HIGH (ref 5–40)

## 2023-04-28 LAB — VITAMIN D 25 HYDROXY (VIT D DEFICIENCY, FRACTURES): Vit D, 25-Hydroxy: 38.6 ng/mL (ref 30.0–100.0)

## 2023-04-28 LAB — TSH+FREE T4
Free T4: 1.21 ng/dL (ref 0.82–1.77)
TSH: 3.54 u[IU]/mL (ref 0.450–4.500)

## 2023-04-28 LAB — URIC ACID: Uric Acid: 7.9 mg/dL — ABNORMAL HIGH (ref 2.6–6.2)

## 2023-05-01 ENCOUNTER — Telehealth: Payer: Self-pay

## 2023-05-01 ENCOUNTER — Encounter: Payer: Self-pay | Admitting: Physician Assistant

## 2023-05-01 DIAGNOSIS — G609 Hereditary and idiopathic neuropathy, unspecified: Secondary | ICD-10-CM | POA: Insufficient documentation

## 2023-05-01 NOTE — Progress Notes (Signed)
Vitamin D looks good.  Thyroid in normal range.  Kidney, liver, glucose look good.  LDL to goal.  HDL goal is higher and would increase with exercise.  TG is elevated. Would you be willing to start medication that targets lower triglycerides?  Uric acid level is high? Are you taking colchine daily?   Small RBC I would like to add serum iron and ferritin to labs to better evaluate.

## 2023-05-01 NOTE — Telephone Encounter (Signed)
Patient was called by Bethanie Dicker, CMA -  Forwarded message to Tandy Gaw with questions and patient herself has also sent a Mychart message to provider.

## 2023-05-01 NOTE — Telephone Encounter (Signed)
Copied from CRM [id number]. Topic: Clinical - Lab/Test Results >> May 01, 2023 10:42 AM Carlatta H wrote: Reason for CRM: Please call patient about blood test results//

## 2023-05-04 ENCOUNTER — Encounter: Payer: Self-pay | Admitting: Physician Assistant

## 2023-05-04 DIAGNOSIS — L97512 Non-pressure chronic ulcer of other part of right foot with fat layer exposed: Secondary | ICD-10-CM | POA: Insufficient documentation

## 2023-05-05 MED ORDER — COLCHICINE 0.6 MG PO TABS: 0.6000 mg | ORAL_TABLET | Freq: Every day | ORAL | 0 refills | Status: DC

## 2023-05-05 MED ORDER — ALLOPURINOL 100 MG PO TABS: 100.0000 mg | ORAL_TABLET | Freq: Every day | ORAL | 1 refills | Status: AC

## 2023-05-19 NOTE — Telephone Encounter (Signed)
 Selena Batten can you sign this encounter thankyou

## 2023-06-02 ENCOUNTER — Other Ambulatory Visit: Payer: Self-pay | Admitting: Physician Assistant

## 2023-06-02 DIAGNOSIS — R600 Localized edema: Secondary | ICD-10-CM

## 2023-07-07 ENCOUNTER — Other Ambulatory Visit: Payer: Self-pay | Admitting: Physician Assistant

## 2023-07-07 DIAGNOSIS — R600 Localized edema: Secondary | ICD-10-CM

## 2023-07-23 ENCOUNTER — Ambulatory Visit: Payer: Self-pay

## 2023-07-23 NOTE — Telephone Encounter (Signed)
  Chief Complaint: L wrist pain Symptoms: pain, swelling Frequency: last night Pertinent Negatives: Patient denies numbness, tingling, discoloration Disposition: [] ED /[x] Urgent Care (no appt availability in office) / [] Appointment(In office/virtual)/ []  [facility]/ [] Home Care/ [] Refused Recommended Disposition /[]  Mobile Bus/ []  Follow-up with PCP Additional Notes: Patient calls reporting L wrist injury while giving grandson a bath last night. States she moved her wrist and felt a pop. Patient states she has some swelling around wrist bone, denies bruising. Per protocol, patient to be evaluated within 24 hours. First available appointment with PCP or any provider in clinic outside of guideline. Patient states she will go to an Urgent Care that is covered by her insurance, also provided patient with information for walk in ortho clinic nearby. Care advice reviewed, patient verbalized understanding and denies further questions at this time. Alerting PCP for review.    Copied from CRM [id number]. Topic: Clinical - Red Word Triage >> Jul 23, 2023 12:03 PM Emylou G wrote: [facility] that prompted transfer to Nurse Triage: broken arm Reason for Disposition  Can't move injured arm normally (bend or straighten completely)  Answer Assessment - Initial Assessment Questions 1. MECHANISM: "How did the injury happen?"     States she was giving grandson a bath and felt a pop 2. ONSET: "When did the injury happen?" (Minutes or hours ago)      Last night 3. LOCATION: "Where is the injury located?" "Which arm?"     L Wrist 4. APPEARANCE of INJURY: "What does the injury look like?"      Swollen, knot on it 5. SEVERITY: "Can you use the arm normally?"      Can but hurts to use, cannot grip 6. SWELLING or BRUISING: "is there any swelling or bruising?" If Yes, ask: "How large is it? (e.g., inches, centimeters)      Yes, swelling on the none area around wrist only 7. PAIN: "Is there  pain?" If Yes, ask: "How bad is the pain?"    (Scale 1-10; or mild, moderate, severe)   - NONE (0): No pain.   - MILD (1-3): Doesn't interfere with normal activities.   - MODERATE (4-7): Interferes with normal activities (e.g., work or school) or awakens from sleep.   - SEVERE (8-10): Excruciating pain, unable to do any normal activities, unable to hold a cup of water.     5/10 8. TETANUS: For any breaks in the skin, ask: "When was the last tetanus booster?"     Think so 9. OTHER SYMPTOMS: "Do you have any other symptoms?"  (e.g., numbness in hand)     Abnormal feeling in pinky, able to move fingers.  Protocols used: Arm Injury-A-AH

## 2023-07-29 ENCOUNTER — Other Ambulatory Visit: Payer: Self-pay | Admitting: Medical-Surgical

## 2023-08-19 ENCOUNTER — Other Ambulatory Visit: Payer: Self-pay | Admitting: Physician Assistant

## 2023-08-19 DIAGNOSIS — R600 Localized edema: Secondary | ICD-10-CM

## 2023-08-24 ENCOUNTER — Ambulatory Visit (INDEPENDENT_AMBULATORY_CARE_PROVIDER_SITE_OTHER): Admitting: Physician Assistant

## 2023-08-24 ENCOUNTER — Encounter: Payer: Self-pay | Admitting: Physician Assistant

## 2023-08-24 ENCOUNTER — Ambulatory Visit: Payer: Self-pay

## 2023-08-24 VITALS — BP 155/68 | HR 68 | Temp 98.1°F | Ht 68.0 in | Wt 250.0 lb

## 2023-08-24 DIAGNOSIS — J3489 Other specified disorders of nose and nasal sinuses: Secondary | ICD-10-CM | POA: Insufficient documentation

## 2023-08-24 DIAGNOSIS — E162 Hypoglycemia, unspecified: Secondary | ICD-10-CM | POA: Insufficient documentation

## 2023-08-24 DIAGNOSIS — J0140 Acute pansinusitis, unspecified: Secondary | ICD-10-CM | POA: Insufficient documentation

## 2023-08-24 LAB — POCT GLYCOSYLATED HEMOGLOBIN (HGB A1C): Hemoglobin A1C: 4.8 % (ref 4.0–5.6)

## 2023-08-24 MED ORDER — MUPIROCIN 2 % EX OINT: TOPICAL_OINTMENT | CUTANEOUS | 0 refills | Status: AC

## 2023-08-24 MED ORDER — SULFAMETHOXAZOLE-TRIMETHOPRIM 800-160 MG PO TABS: 1.0000 | ORAL_TABLET | Freq: Two times a day (BID) | ORAL | 0 refills | Status: DC

## 2023-08-24 NOTE — Telephone Encounter (Signed)
 Patient scheduled today with Tandy Gaw, PA

## 2023-08-24 NOTE — Patient Instructions (Addendum)
 Hold nasal sprays Bactroban  topically Bactrim  for 10 days Will make referral to ENT  Hypoglycemia Hypoglycemia is when the amount of sugar, or glucose, in your blood is too low. Low blood sugar can happen if you have diabetes or if you don't have diabetes. It may be an emergency. What are the causes? Low blood sugar happens most often in people who have diabetes. It may be caused by: Diabetes medicine. Not eating enough, or not eating often enough. Being more active than normal. If you don't have diabetes, you may still get low blood sugar if: There's a tumor in your pancreas. A tumor is a growth of cells that isn't normal. You don't eat enough, or you fast. Fasting is when you don't eat for long periods at a time. You have a bad infection or illness. You have problems after weight loss surgery. You have kidney or liver problems. You take certain medicines. What increases the risk? You're more likely to have low blood sugar if: You have diabetes and take medicine for it. You drink a lot of alcohol. You get sick. What are the signs or symptoms? Mild Hunger or feeling like you may vomit. Sweating and feeling cold to the touch. Feeling dizzy or light-headed. Being sleepy or having trouble sleeping. A headache. Blurry vision. Mood changes. These include feeling worried, nervous, or easily annoyed. Moderate Feeling confused. Changes in the way you act. Weakness. An uneven heartbeat. Very bad Having very low blood sugar is an emergency. It can cause: Fainting. Seizures. A coma. Death. How is this diagnosed?  Low blood sugar can be found with a blood test. This test tells you how much sugar is in your blood. It's done while you're having symptoms. Your health care provider may also do an exam and look at your medical history. How is this treated? Treating low blood sugar If you have low blood sugar, eat or drink something with sugar in it right away. The food or drink  should have 15 grams of a fast-acting carbohydrate (carb). Options include: 4 oz (120 mL) of fruit juice. 4 oz (120 mL) of soda (not diet soda). A few pieces of hard candy. Check food labels to see how many pieces to eat. 1 Tbsp (15 mL) of sugar or honey. 4 glucose tablets. 1 tube of glucose gel. Treating low blood sugar if you have diabetes Talk with your provider about how much carb you should take. If you're alert and can swallow safely, you may follow the 15:15 rule: Take 15 grams of a fast-acting carb. Check your blood sugar 15 minutes after you take the carb. If your blood sugar is still at or below 70 mg/dL (3.9 mmol/L), take 15 grams of a carb again. If your blood sugar doesn't go above 70 mg/dL (3.9 mmol/L) after 3 tries, get help right away. After your blood sugar goes back to normal, eat a meal or a snack within 1 hour. Always keep 15 grams of a fast-acting carb with you. This could be: 4 glucose tablets. A few pieces of hard candy. 1 Tbsp (15 mL) of honey or sugar. 1 tube of glucose gel. Treating very low blood sugar If your blood sugar is less than 54 mg/dL (3 mmol/L), it's an emergency. Get help right away. If you can't eat or drink, you will need to be given glucagon. A family member or friend should learn how to check your blood sugar and give you glucagon. Ask your provider if you should keep a  glucagon kit at home. You may also need to be treated in a hospital. Follow these instructions at home: If you have diabetes: Always keep a fast-acting carb (15 grams) with you. Follow your diabetes care plan. Make sure you: Know the symptoms of low blood sugar. Check your blood sugar as often as told. Always check it before and after you exercise. Always check your blood sugar before you drive. Take your medicines as told. Eat on time. Do not skip meals. Share your diabetes care plan with: Your work or school. The people you live with. Wear an alert bracelet or carry a card  that says you have diabetes. General instructions If you drink alcohol: Limit how much you have to: 0-1 drink a day if you're female. 0-2 drinks a day if you're female. Know how much alcohol is in your drink. In the [place] one drink is one 12 oz bottle of beer (355 mL), one 5 oz glass of wine (148 mL), or one 1 oz glass of hard liquor (44 mL). Be sure to eat food when you drink alcohol. Be sure to check your blood sugar after you drink. Alcohol may lead to low blood sugar later. Where to find more information American Diabetes Association (ADA): diabetes.org Contact a health care provider if: You have low blood sugar often. You have diabetes and are having trouble keeping your blood sugar in the right range. Get help right away if: You can't get your blood sugar above 70 mg/dL (3.9 mmol/L) after 3 tries. Your blood sugar is below 54 mg/dL (3 mmol/L). You have a seizure. You faint. These symptoms may be an emergency. Call 911 right away. Do not wait to see if the symptoms will go away. Do not drive yourself to the hospital. This information is not intended to replace advice given to you by your health care provider. Make sure you discuss any questions you have with your health care provider. Document Revised: 01/22/2023 Document Reviewed: 07/09/2022 Elsevier Patient Education  2024 [employer] Inc.  Sinus Infection, Adult A sinus infection is soreness and swelling (inflammation) of your sinuses. Sinuses are hollow spaces in the bones around your face. They are located: Around your eyes. In the middle of your forehead. Behind your nose. In your cheekbones. Your sinuses and nasal passages are lined with a fluid called mucus. Mucus drains out of your sinuses. Swelling can trap mucus in your sinuses. This lets germs (bacteria, virus, or fungus) grow, which leads to infection. Most of the time, this condition is caused by a virus. What are the causes? Allergies. Asthma. Germs. Things that  block your nose or sinuses. Growths in the nose (nasal polyps). Chemicals or irritants in the air. A fungus. This is rare. What increases the risk? Having a weak body defense system (immune system). Doing a lot of swimming or diving. Using nasal sprays too much. Smoking. What are the signs or symptoms? The main symptoms of this condition are pain and a feeling of pressure around the sinuses. Other symptoms include: Stuffy nose (congestion). This may make it hard to breathe through your nose. Runny nose (drainage). Soreness, swelling, and warmth in the sinuses. A cough that may get worse at night. Being unable to smell and taste. Mucus that collects in the throat or the back of the nose (postnasal drip). This may cause a sore throat or bad breath. Being very tired (fatigued). A fever. How is this diagnosed? Your symptoms. Your medical history. A physical exam. Tests to find  out if your condition is short-term (acute) or long-term (chronic). Your doctor may: Check your nose for growths (polyps). Check your sinuses using a tool that has a light on one end (endoscope). Check for allergies or germs. Do imaging tests, such as an MRI or CT scan. How is this treated? Treatment for this condition depends on the cause and whether it is short-term or long-term. If caused by a virus, your symptoms should go away on their own within 10 days. You may be given medicines to relieve symptoms. They include: Medicines that shrink swollen tissue in the nose. A spray that treats swelling of the nostrils. Rinses that help get rid of thick mucus in your nose (nasal saline washes). Medicines that treat allergies (antihistamines). Over-the-counter pain relievers. If caused by bacteria, your doctor may wait to see if you will get better without treatment. You may be given antibiotic medicine if you have: A very bad infection. A weak body defense system. If caused by growths in the nose, surgery may be  needed. Follow these instructions at home: Medicines Take, use, or apply over-the-counter and prescription medicines only as told by your doctor. These may include nasal sprays. If you were prescribed an antibiotic medicine, take it as told by your doctor. Do not stop taking it even if you start to feel better. Hydrate and humidify  Drink enough water  to keep your pee (urine) pale yellow. Use a cool mist humidifier to keep the humidity level in your home above 50%. Breathe in steam for 10-15 minutes, 3-4 times a day, or as told by your doctor. You can do this in the bathroom while a hot shower is running. Try not to spend time in cool or dry air. Rest Rest as much as you can. Sleep with your head raised (elevated). Make sure you get enough sleep each night. General instructions  Put a warm, moist washcloth on your face 3-4 times a day, or as often as told by your doctor. Use nasal saline washes as often as told by your doctor. Wash your hands often with soap and water . If you cannot use soap and water , use hand sanitizer. Do not smoke. Avoid being around people who are smoking (secondhand smoke). Keep all follow-up visits. Contact a doctor if: You have a fever. Your symptoms get worse. Your symptoms do not get better within 10 days. Get help right away if: You have a very bad headache. You cannot stop vomiting. You have very bad pain or swelling around your face or eyes. You have trouble seeing. You feel confused. Your neck is stiff. You have trouble breathing. These symptoms may be an emergency. Get help right away. Call 911. Do not wait to see if the symptoms will go away. Do not drive yourself to the hospital. Summary A sinus infection is swelling of your sinuses. Sinuses are hollow spaces in the bones around your face. This condition is caused by tissues in your nose that become inflamed or swollen. This traps germs. These can lead to infection. If you were prescribed an  antibiotic medicine, take it as told by your doctor. Do not stop taking it even if you start to feel better. Keep all follow-up visits. This information is not intended to replace advice given to you by your health care provider. Make sure you discuss any questions you have with your health care provider. Document Revised: 03/26/2021 Document Reviewed: 03/26/2021 Elsevier Patient Education  2024 [employer].

## 2023-08-24 NOTE — Telephone Encounter (Signed)
 Chief Complaint: Sinus pain  Symptoms: Facial pressure, congestion, greenish-yellow sputum, blood tinged mucus, post nasal drip, cough Frequency: Constant  Pertinent Negatives: Patient denies fever, chest pain, nausea, vomiting Disposition: [] ED /[] Urgent Care (no appt availability in office) / [x] Appointment(In office/virtual)/ []  [facility]/ [] Home Care/ [] Refused Recommended Disposition /[] [facility]/ []  Follow-up with PCP Additional Notes: Patient states she has been sick for a few weeks now and tried OTC treatment for allergy symptoms. Patient states the symptoms have only gotten worse over time. Patient also reports fainting spells if she hasn't had anything to eat. Patient concerned about possible diabetes. Care advice was given and patient has been scheduled this afternoon with PCP for evaluation.   Copied from CRM [id number]. Topic: Appointments - Red Word Triage >> Aug 24, 2023 11:31 AM Tiffany H wrote: Patient called to advise that she's been battling a sinus infection for the past six weeks that she believed, originally, was allergies due to excess pollen in her area. Patient advised that she has congestion, blood in mucus, cough, a lot of drainage. No fever, but she has a lot of pressure in her facial area.   Patient advised that she's been having spells of weakness. Almost faints. Patient advised that eating typically helps. Patient has had long suspicion she might be diabetic. She would also like to have a test on glucose levels to check for diabetes, patient also has a history of having slow healing wounds after surgical procedures.   Please assist. Reason for Disposition  [1] Sinus congestion (pressure, fullness) AND [2] present > 10 days  Answer Assessment - Initial Assessment Questions 1. LOCATION: "Where does it hurt?"      Around the eyes and nose  2. ONSET: "When did the sinus pain start?"  (e.g., hours, days)      A Few weeks ago  3. SEVERITY: "How  bad is the pain?"   (Scale 1-10; mild, moderate or severe)   - MILD (1-3): doesn't interfere with normal activities    - MODERATE (4-7): interferes with normal activities (e.g., work or school) or awakens from sleep   - SEVERE (8-10): excruciating pain and patient unable to do any normal activities        Moderate  4. RECURRENT SYMPTOM: "Have you ever had sinus problems before?" If Yes, ask: "When was the last time?" and "What happened that time?"      Yes, I needed antibiotics  5. NASAL CONGESTION: "Is the nose blocked?" If Yes, ask: "Can you open it or must you breathe through your mouth?"     Yes, breathing through mouth  6. NASAL DISCHARGE: "Do you have discharge from your nose?" If so ask, "What color?"     Yes, blood tinged and greenish-yellow  7. FEVER: "Do you have a fever?" If Yes, ask: "What is it, how was it measured, and when did it start?"      No  8. OTHER SYMPTOMS: "Do you have any other symptoms?" (e.g., sore throat, cough, earache, difficulty breathing)     Cough, facial pressure  9. PREGNANCY: "Is there any chance you are pregnant?" "When was your last menstrual period?"     No  Protocols used: Sinus Pain or Congestion-A-AH

## 2023-08-25 ENCOUNTER — Encounter: Payer: Self-pay | Admitting: Physician Assistant
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
## 2023-10-01 ENCOUNTER — Other Ambulatory Visit: Payer: Self-pay | Admitting: Physician Assistant

## 2023-10-01 DIAGNOSIS — R600 Localized edema: Secondary | ICD-10-CM

## 2023-10-16 DIAGNOSIS — J4540 Moderate persistent asthma, uncomplicated: Secondary | ICD-10-CM | POA: Insufficient documentation

## 2023-10-26 ENCOUNTER — Ambulatory Visit: Payer: Medicaid Other | Admitting: Physician Assistant

## 2023-10-28 LAB — COLOGUARD: COLOGUARD: NEGATIVE

## 2023-11-11 DIAGNOSIS — Z90710 Acquired absence of both cervix and uterus: Secondary | ICD-10-CM | POA: Insufficient documentation

## 2023-11-26 DIAGNOSIS — Z955 Presence of coronary angioplasty implant and graft: Secondary | ICD-10-CM | POA: Insufficient documentation

## 2023-11-27 DIAGNOSIS — Z8669 Personal history of other diseases of the nervous system and sense organs: Secondary | ICD-10-CM | POA: Insufficient documentation

## 2023-11-27 DIAGNOSIS — Z8739 Personal history of other diseases of the musculoskeletal system and connective tissue: Secondary | ICD-10-CM | POA: Insufficient documentation

## 2023-11-30 ENCOUNTER — Telehealth: Payer: Self-pay

## 2023-11-30 NOTE — Transitions of Care (Post Inpatient/ED Visit) (Signed)
   11/30/2023  Name: Kathryn Cline MRN: [id number] DOB: July 03, 1975  Today's TOC FU Call Status: Today's TOC FU Call Status:: Unsuccessful Call (1st Attempt) (Call note from 11/30/23 [facility] states Member confirms PCP is [facility] RN placed one call in effort to confirm - No answer - Will not attempt further outreach related to  (per chart) patient is no longer a patient of [facility] and is being seen at [facility].  Unsuccessful Call (1st Attempt) Date: 11/30/23   Follow Up Plan: No further outreach attempts will be made at this time. We have been unable to contact the patient.  Shona Prow RN, CCM [place]  [facility]Population [facility] RN Care Manager [phone]

## 2023-12-01 ENCOUNTER — Other Ambulatory Visit: Payer: Self-pay | Admitting: Physician Assistant

## 2023-12-26 DIAGNOSIS — I255 Ischemic cardiomyopathy: Secondary | ICD-10-CM | POA: Insufficient documentation

## 2024-01-05 ENCOUNTER — Encounter: Payer: Self-pay | Admitting: Sports Medicine

## 2024-01-05 DIAGNOSIS — I25118 Atherosclerotic heart disease of native coronary artery with other forms of angina pectoris: Secondary | ICD-10-CM | POA: Insufficient documentation

## 2024-03-16 ENCOUNTER — Other Ambulatory Visit: Payer: Self-pay | Admitting: Physician Assistant

## 2024-05-16 ENCOUNTER — Telehealth: Payer: Self-pay | Admitting: Physician Assistant

## 2024-05-16 NOTE — Telephone Encounter (Signed)
 Ok perfect

## 2024-05-16 NOTE — Telephone Encounter (Signed)
"   Ms. Achenbach spoke to Joy today(Her daughter is a patient of Joy's and was seen today) about becoming her patient. Joy told me she has agreed to see Ms. Cake.  "

## 2024-05-19 DIAGNOSIS — F3160 Bipolar disorder, current episode mixed, unspecified: Secondary | ICD-10-CM | POA: Insufficient documentation

## 2024-05-19 DIAGNOSIS — I252 Old myocardial infarction: Secondary | ICD-10-CM | POA: Insufficient documentation

## 2024-05-19 DIAGNOSIS — I119 Hypertensive heart disease without heart failure: Secondary | ICD-10-CM | POA: Insufficient documentation

## 2024-05-24 ENCOUNTER — Encounter: Admitting: Medical-Surgical

## 2024-05-25 ENCOUNTER — Encounter: Payer: Self-pay | Admitting: Medical-Surgical

## 2024-05-25 ENCOUNTER — Ambulatory Visit: Admitting: Medical-Surgical

## 2024-05-25 VITALS — BP 128/74 | HR 85 | Temp 99.2°F | Resp 16 | Ht 68.0 in | Wt 269.1 lb

## 2024-05-25 DIAGNOSIS — I10 Essential (primary) hypertension: Secondary | ICD-10-CM | POA: Diagnosis not present

## 2024-05-25 DIAGNOSIS — Z7689 Persons encountering health services in other specified circumstances: Secondary | ICD-10-CM

## 2024-05-25 DIAGNOSIS — E66813 Obesity, class 3: Secondary | ICD-10-CM

## 2024-05-25 DIAGNOSIS — M8580 Other specified disorders of bone density and structure, unspecified site: Secondary | ICD-10-CM | POA: Diagnosis not present

## 2024-05-25 DIAGNOSIS — J45909 Unspecified asthma, uncomplicated: Secondary | ICD-10-CM | POA: Insufficient documentation

## 2024-05-25 DIAGNOSIS — Z6841 Body Mass Index (BMI) 40.0 and over, adult: Secondary | ICD-10-CM | POA: Diagnosis not present

## 2024-05-25 DIAGNOSIS — S86111A Strain of other muscle(s) and tendon(s) of posterior muscle group at lower leg level, right leg, initial encounter: Secondary | ICD-10-CM

## 2024-05-25 DIAGNOSIS — E781 Pure hyperglyceridemia: Secondary | ICD-10-CM | POA: Diagnosis not present

## 2024-05-25 DIAGNOSIS — G43809 Other migraine, not intractable, without status migrainosus: Secondary | ICD-10-CM

## 2024-05-25 DIAGNOSIS — M797 Fibromyalgia: Secondary | ICD-10-CM

## 2024-05-25 DIAGNOSIS — M47817 Spondylosis without myelopathy or radiculopathy, lumbosacral region: Secondary | ICD-10-CM

## 2024-05-25 MED ORDER — TOPIRAMATE 50 MG PO TABS: 50.0000 mg | ORAL_TABLET | Freq: Three times a day (TID) | ORAL | 3 refills | Status: AC

## 2024-05-25 MED ORDER — WEGOVY 0.25 MG/0.5ML ~~LOC~~ SOAJ: 0.2500 mg | SUBCUTANEOUS | 0 refills | Status: AC

## 2024-05-25 NOTE — Progress Notes (Addendum)
 "       Established patient visit   History of Present Illness   Discussed the use of AI scribe software for clinical note transcription with the patient, who gave verbal consent to proceed.  History of Present Illness   Kathryn Cline is a 49 year old female with fibromyalgia, neuropathy, and anxiety who presents for a transfer of care and medication management.  Chronic pain syndromes - Daily fibromyalgia and neuropathic pain. - Pain worsens with emotional distress and is briefly relieved by hot baths or showers. - Gabapentin causes cognitive side effects, not well tolerated. - Prior fibromyalgia-directed treatments caused swelling. - Cannot take ibuprofen ; uses Tylenol  for arthritis pain. - Chronic pain contributes to chronic fatigue and impacts daily functioning.  Mood and anxiety symptoms - Anxiety and depression can keep her in bed and affect social interactions and activity. - History of a past episode with suicidal intent interrupted by her mother; currently denies SI/HI. - Recent serious health issues in her brother and parents increase her stress. - PTSD present and contributes to overall distress. - Currently taking fluoxetine  40mg  daily, Ativan  0.5mg  every 8 hours prn, Cymbalta  60mg  BID, and Lamictal  200mg  daily - Feels overmedicated yet continues to have significant symptoms. - Prior attempt to stop Cymbalta  was not helpful. - Recently ran out of Ativan , which she had been using daily when available. - Not currently doing counseling, unsure if she is interested in a referral  Hypertension/Hx of MI - Takiing losartan  25mg  daily, Plavix 75mg  daily, metoprolol 12.5mg  BID, and Lasix  40 daily prn; medications well tolerated - Not regularly monitoring BP at home - Followed by Cardiology and up to date on follow up appointments  Weight management difficulties - Previously used Wegovy  for weight management but discontinued due to insurance barriers. - Distressed by weight  regain of ~60lbs after prior significant weight loss (~100lbs), which negatively affects mental health - Exercise limited by chronic pain and orthopedic conditions - Actively seeking healthy dietary options and working on portion control  Gastrointestinal and genitourinary symptoms - Constipation and esophageal dysphagia present. - Abdominal bloating noted.  Respiratory symptoms - Asthma with flares when ill. - Nasal septal perforation present.  Edema and vascular symptoms - Edema in feet and ankles, chronic.  Neurological symptoms - Chronic fatigue. - Right ulnar nerve issues. - Chronic migraines managed with Topamax  50mg  TID  Social and functional impact - Lives with and helps care for her parents and grandchildren. - Chronic pain, mood symptoms, and fatigue impact ability to participate in social and daily activities.     Physical Exam   Physical Exam Vitals reviewed.  Constitutional:      General: She is not in acute distress.    Appearance: Normal appearance. She is obese. She is not ill-appearing.  HENT:     Head: Normocephalic and atraumatic.  Cardiovascular:     Rate and Rhythm: Normal rate and regular rhythm.     Pulses: Normal pulses.     Heart sounds: Normal heart sounds. No murmur heard.    No friction rub. No gallop.  Pulmonary:     Effort: Pulmonary effort is normal. No respiratory distress.     Breath sounds: Normal breath sounds. No wheezing.  Skin:    General: Skin is warm and dry.  Neurological:     Mental Status: She is alert and oriented to person, place, and time.  Psychiatric:        Mood and Affect: Mood is depressed. Affect is  tearful.        Behavior: Behavior normal. Behavior is cooperative.        Thought Content: Thought content normal.        Judgment: Judgment normal.    Assessment & Plan   Class 3 severe obesity due to excess calories without serious comorbidity with body mass index (BMI) of 40.0 to 44.9 in adult (HCC)/ History of  NSTEMI/ CAD with stable angina pectoris Significant weight gain negatively affecting mental health and well-being. History of NSTEMI. Known CAD with stable angina pectoris. Followed by Cardiology. Previous weight loss with Wegovy , but insurance no longer covered it. Discussed weight loss benefits on cardiovascular health, cholesterol, and mental health. Explored alternative medications that may be covered by insurance. Patient reports that her insurance company representative advised that Wegovy  would be covered.  - Submitted prior authorization for Wegovy  with supporting documentation. - Consider alternative weight loss medications if Wegovy  is not approved. - Despite dietary changes, she has been unable to lose 5% of her body weight. It is medically necessary for the patient to add pharmacotherapy to their obesity treatment plan at this time. The patient will continue lifestyle modifications, physical activity, and caloric restrictions while on therapy. The current BMI is 40.91 and body weight is 269.1lbs prior to starting medication to treat her obesity. Pharmacotherapy is recommended following AACE guidelines. Patient is not taking another GLP-1 receptor agonist and is not pregnant/lactating. There are no contraindications.   Major depressive disorder and generalized anxiety disorder Chronic depression and anxiety impacting daily life. Current medications include Cymbalta , Fluoxetine , and Lamictal . Anxiety managed with prn Ativan . Discussed counseling and stress management. - Refilled Ativan  prescription. - Consider counseling referral if desired. - Discuss potential medication adjustments at follow up in 4 weeks.  Essential hypertension/Hx of MI - Follow up with Cardiology as instructed.   Right foot pain and swelling, post-surgical Recurrent pain and swelling post-surgery, possibly due to bone spur. Previous surgery had complications. She is reluctant to undergo further surgery. - Referred to  Dr. Duwaine for evaluation of right foot pain and swelling.  Fibromyalgia with chronic pain and peripheral neuropathy Chronic pain and neuropathy with ineffective current medications. Gabapentin causes sedation. Pain management challenging due to side effects. - Continue Cymbalta  60mg  BID.  Osteoarthritis and osteopenia Managed with calcium and vitamin D . Weight-bearing exercise recommended. Discussed oral bisphosphonates risks due to esophageal issues. - Continue calcium and vitamin D  supplementation. - Encouraged regular weight-bearing exercise.  Migraine Chronic migraines managed with Topamax  50mg  TID. - Refilled Topamax  prescription.  Hyperlipidemia Discussed weight loss benefits on cholesterol. - Continue Atorvastatin 80mg  daily.    Follow up   Return in about 4 weeks (around 06/22/2024) for weight check and mood follow up. __________________________________ Zada FREDRIK Palin, DNP, APRN, FNP-BC Primary Care and Sports Medicine [facility] "

## 2024-05-26 ENCOUNTER — Other Ambulatory Visit: Payer: Self-pay

## 2024-05-26 ENCOUNTER — Telehealth: Payer: Self-pay

## 2024-05-26 MED ORDER — METFORMIN HCL ER 750 MG PO TB24: 750.0000 mg | ORAL_TABLET | Freq: Every day | ORAL | 0 refills | Status: AC

## 2024-05-26 NOTE — Telephone Encounter (Signed)
 Copied from CRM #[id number]. Topic: Clinical - Prescription Issue >> May 26, 2024 11:13 AM Mercer PEDLAR wrote: Reason for CRM:  Patient stated that she brought in her medication with her to the appointment on 05/25/24 and is calling to check if she had left her metFORMIN  at clinic. CAL stated that it is not at clinic. Patient is requesting a refill to be sent to pharmacy.   [facility] - [place], [place] - [address] [address] [place] [address] Phone: [phone] Fax: [phone]

## 2024-05-30 ENCOUNTER — Ambulatory Visit: Admitting: Physician Assistant

## 2024-05-31 ENCOUNTER — Other Ambulatory Visit (HOSPITAL_COMMUNITY): Payer: Self-pay

## 2024-05-31 ENCOUNTER — Encounter: Payer: Self-pay | Admitting: Medical-Surgical

## 2024-06-03 ENCOUNTER — Telehealth: Payer: Self-pay | Admitting: Medical-Surgical

## 2024-06-03 NOTE — Telephone Encounter (Unsigned)
 Copied from CRM [id number]. Topic: General - Other >> Jun 02, 2024  5:51 PM Delon T wrote: Reason for CRM: Kathryn Cline with [facility]- medication appeal has been downgraded from urgent to standard for Wegovy - [phone]

## 2024-06-09 ENCOUNTER — Other Ambulatory Visit (HOSPITAL_COMMUNITY): Payer: Self-pay

## 2024-06-09 ENCOUNTER — Telehealth: Payer: Self-pay

## 2024-06-09 NOTE — Telephone Encounter (Signed)
 Pharmacy Patient Advocate Encounter   Received notification from CoverMyMeds that prior authorization for Wegovy  0.25mg /0.57ml is required/requested.   Insurance verification completed.   The patient is insured through [facility] MEDICAID.   Kathryn HUXLEY  PA# [id number]  The prior authorization has been denied.

## 2024-06-23 ENCOUNTER — Ambulatory Visit: Admitting: Medical-Surgical
